# Patient Record
Sex: Female | Born: 1980 | Race: White | Hispanic: No | Marital: Married | Smoking: Never smoker
Health system: Southern US, Community
[De-identification: ages and names within clinical notes are randomized; demographics above are authoritative.]

## PROBLEM LIST (undated history)

## (undated) DIAGNOSIS — E538 Deficiency of other specified B group vitamins: Secondary | ICD-10-CM

## (undated) DIAGNOSIS — G2581 Restless legs syndrome: Secondary | ICD-10-CM

## (undated) DIAGNOSIS — R600 Localized edema: Secondary | ICD-10-CM

## (undated) DIAGNOSIS — E559 Vitamin D deficiency, unspecified: Secondary | ICD-10-CM

## (undated) DIAGNOSIS — S61217A Laceration without foreign body of left little finger without damage to nail, initial encounter: Secondary | ICD-10-CM

## (undated) DIAGNOSIS — F329 Major depressive disorder, single episode, unspecified: Secondary | ICD-10-CM

## (undated) DIAGNOSIS — K5090 Crohn's disease, unspecified, without complications: Secondary | ICD-10-CM

## (undated) DIAGNOSIS — F419 Anxiety disorder, unspecified: Secondary | ICD-10-CM

## (undated) DIAGNOSIS — F32A Depression, unspecified: Secondary | ICD-10-CM

## (undated) DIAGNOSIS — D649 Anemia, unspecified: Secondary | ICD-10-CM

## (undated) DIAGNOSIS — R0600 Dyspnea, unspecified: Secondary | ICD-10-CM

## (undated) DIAGNOSIS — J302 Other seasonal allergic rhinitis: Secondary | ICD-10-CM

## (undated) DIAGNOSIS — I8290 Acute embolism and thrombosis of unspecified vein: Secondary | ICD-10-CM

## (undated) DIAGNOSIS — R030 Elevated blood-pressure reading, without diagnosis of hypertension: Secondary | ICD-10-CM

## (undated) DIAGNOSIS — K589 Irritable bowel syndrome without diarrhea: Secondary | ICD-10-CM

## (undated) DIAGNOSIS — R160 Hepatomegaly, not elsewhere classified: Secondary | ICD-10-CM

## (undated) DIAGNOSIS — K219 Gastro-esophageal reflux disease without esophagitis: Secondary | ICD-10-CM

## (undated) DIAGNOSIS — R519 Headache, unspecified: Secondary | ICD-10-CM

## (undated) DIAGNOSIS — D259 Leiomyoma of uterus, unspecified: Secondary | ICD-10-CM

## (undated) HISTORY — DX: Leiomyoma of uterus, unspecified: D25.9

## (undated) HISTORY — DX: Deficiency of other specified B group vitamins: E53.8

## (undated) HISTORY — DX: Hepatomegaly, not elsewhere classified: R16.0

## (undated) HISTORY — DX: Vitamin D deficiency, unspecified: E55.9

## (undated) HISTORY — DX: Gastro-esophageal reflux disease without esophagitis: K21.9

## (undated) HISTORY — DX: Anemia, unspecified: D64.9

## (undated) HISTORY — DX: Irritable bowel syndrome, unspecified: K58.9

## (undated) HISTORY — DX: Depression, unspecified: F32.A

## (undated) HISTORY — DX: Laceration without foreign body of left little finger without damage to nail, initial encounter: S61.217A

## (undated) HISTORY — DX: Acute embolism and thrombosis of unspecified vein: I82.90

## (undated) HISTORY — DX: Restless legs syndrome: G25.81

## (undated) HISTORY — PX: BOWEL RESECTION: SHX1257

## (undated) HISTORY — DX: Other seasonal allergic rhinitis: J30.2

## (undated) HISTORY — DX: Elevated blood-pressure reading, without diagnosis of hypertension: R03.0

## (undated) HISTORY — DX: Dyspnea, unspecified: R06.00

## (undated) HISTORY — DX: Localized edema: R60.0

## (undated) HISTORY — DX: Headache, unspecified: R51.9

## (undated) HISTORY — PX: NASAL SINUS SURGERY: SHX719

## (undated) HISTORY — PX: BREAST BIOPSY: SHX20

---
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
## 2011-11-02 ENCOUNTER — Other Ambulatory Visit: Payer: Self-pay | Admitting: Obstetrics and Gynecology

## 2011-11-02 DIAGNOSIS — D219 Benign neoplasm of connective and other soft tissue, unspecified: Secondary | ICD-10-CM

## 2011-11-02 DIAGNOSIS — IMO0002 Reserved for concepts with insufficient information to code with codable children: Secondary | ICD-10-CM

## 2011-11-05 ENCOUNTER — Ambulatory Visit
Admission: RE | Admit: 2011-11-05 | Discharge: 2011-11-05 | Disposition: A | Discharge status: Home / Self Care | Payer: BC Managed Care – PPO | Source: Ambulatory Visit | Attending: Obstetrics and Gynecology | Admitting: Obstetrics and Gynecology

## 2011-11-05 DIAGNOSIS — IMO0002 Reserved for concepts with insufficient information to code with codable children: Secondary | ICD-10-CM

## 2011-11-05 DIAGNOSIS — D219 Benign neoplasm of connective and other soft tissue, unspecified: Secondary | ICD-10-CM

## 2013-07-10 IMAGING — US US TRANSVAGINAL NON-OB
1 series · 14 of 25 positions shown · non-contrast
Comparison: None.

CLINICAL DATA: Fibroids.  Dyspareunia.

TRANSVAGINAL ULTRASOUND OF PELVIS
TECHNIQUE: Transvaginal ultrasound examination of the pelvis was
performed including evaluation of the uterus, ovaries, adnexal
regions, and pelvic cul-de-sac.

[Series 1: us transvaginal non-ob · 0.13mm/px · 14 of 55 slices shown]
[im 1/55]
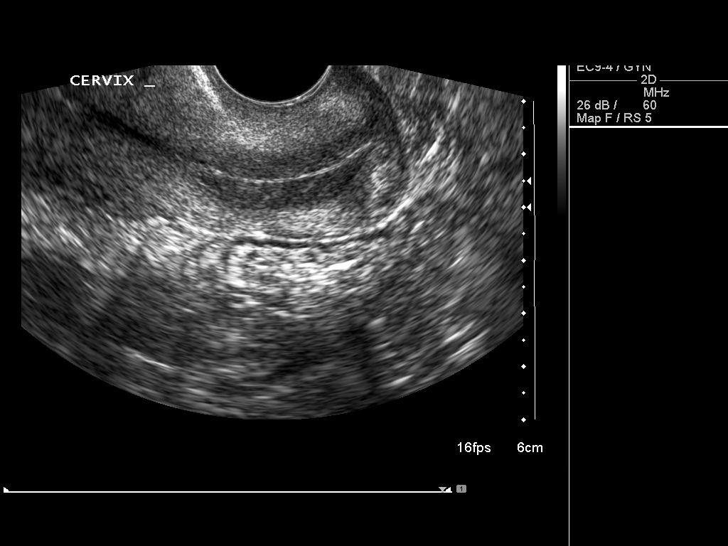
[im 5/55]
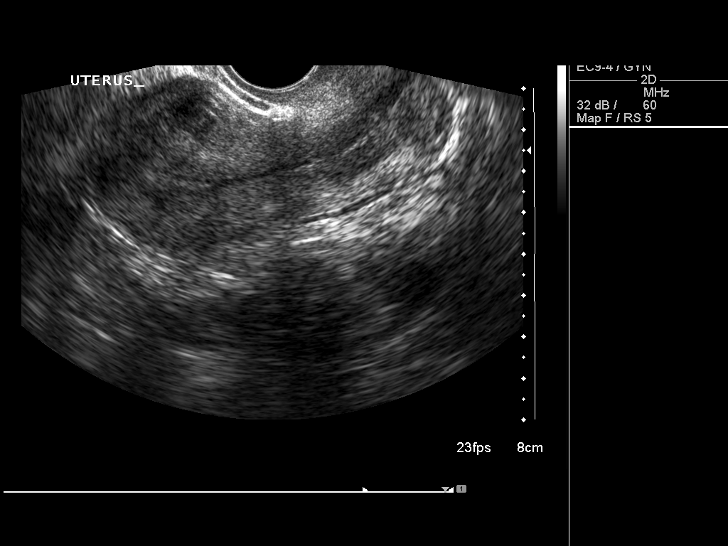
[im 10/55]
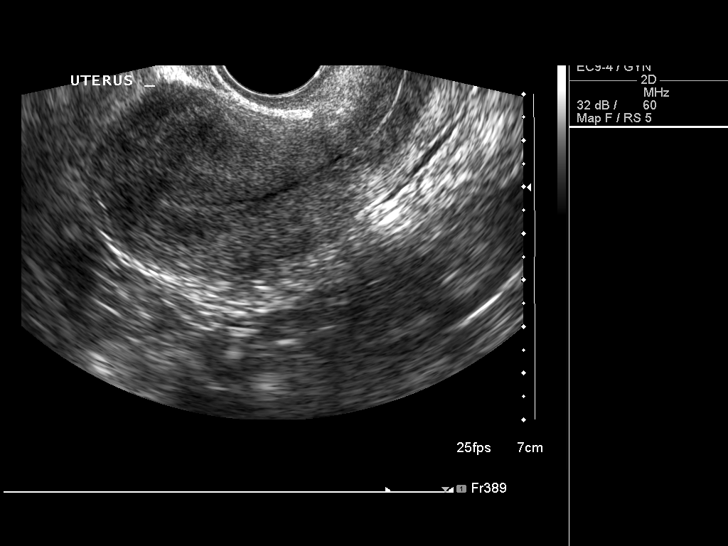
[im 14/55]
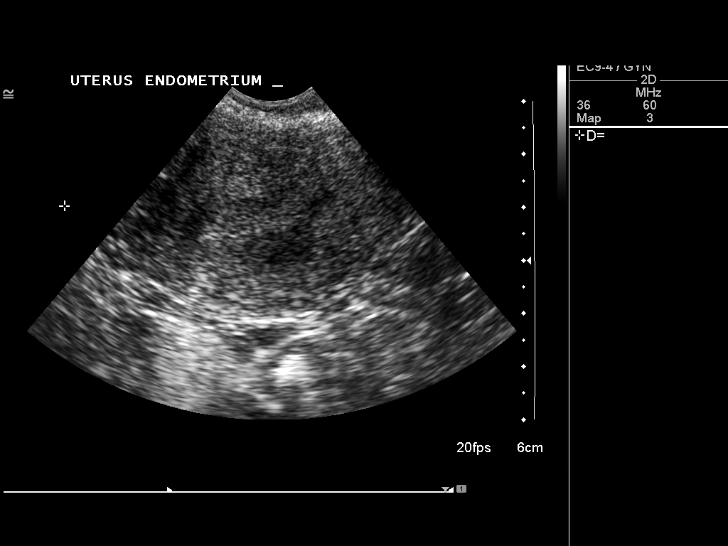
[im 19/55]
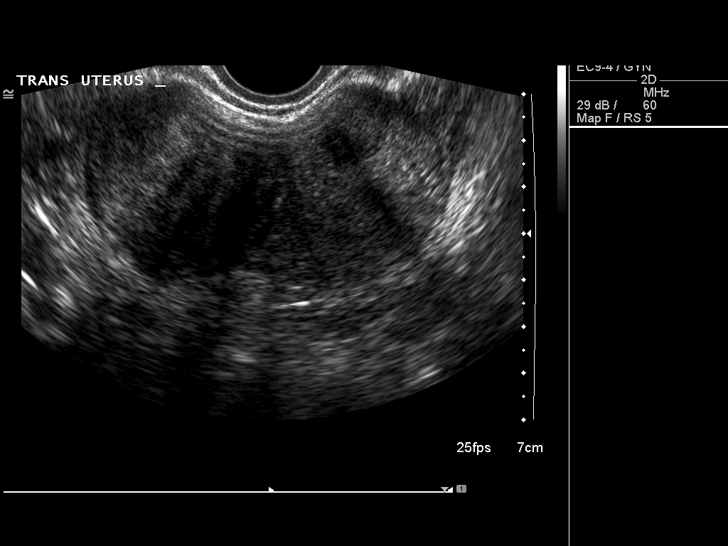
[im 21/55]
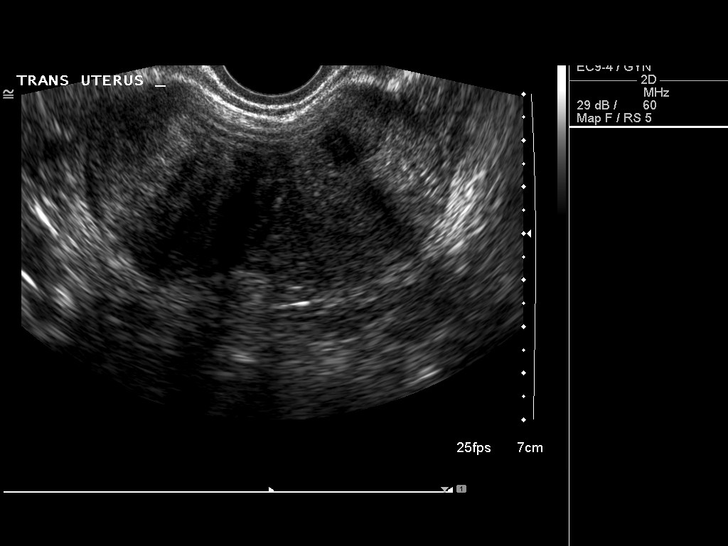
[im 25/55]
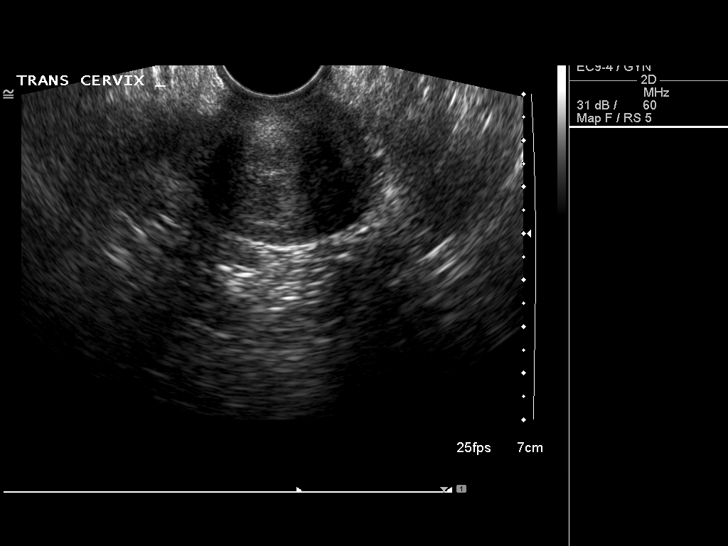
[im 30/55]
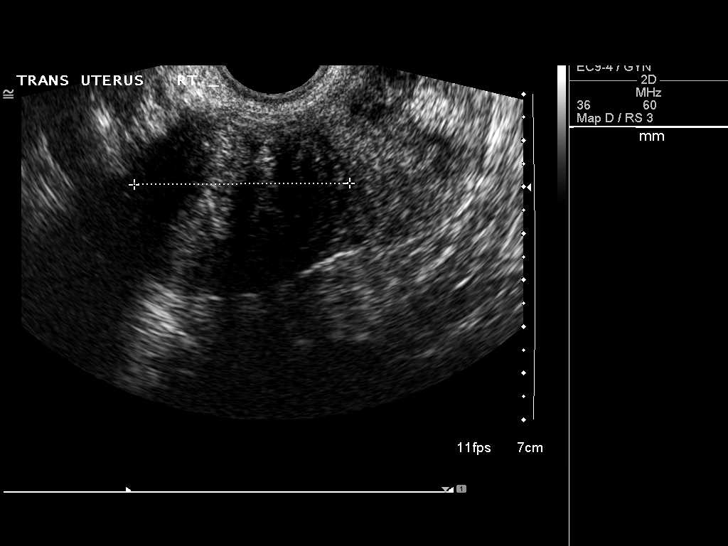
[im 34/55]
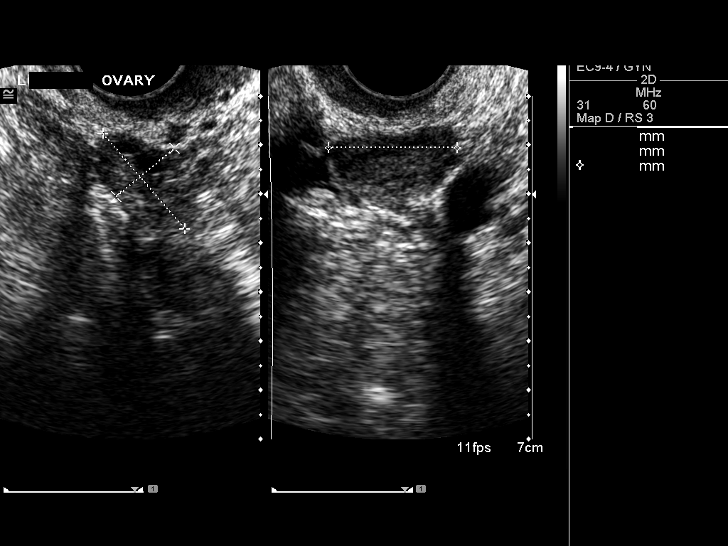
[im 37/55]
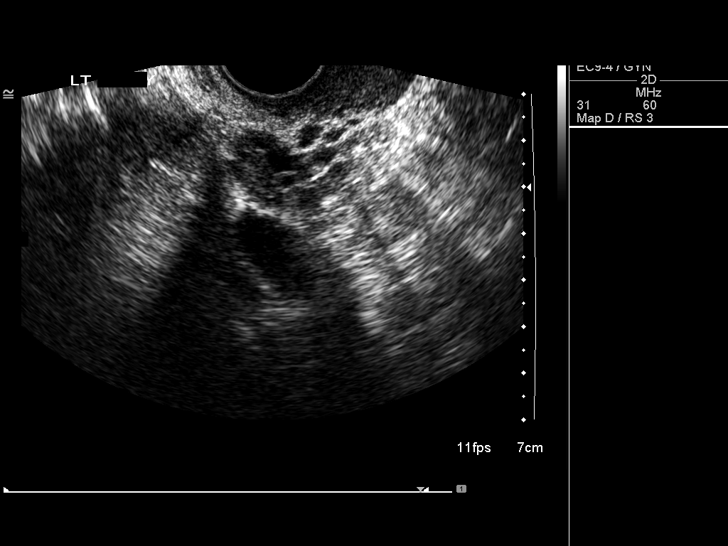
[im 41/55]
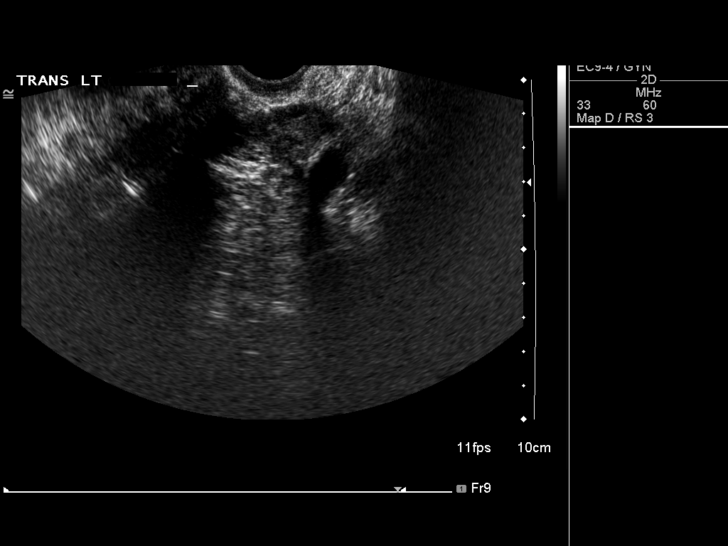
[im 46/55]
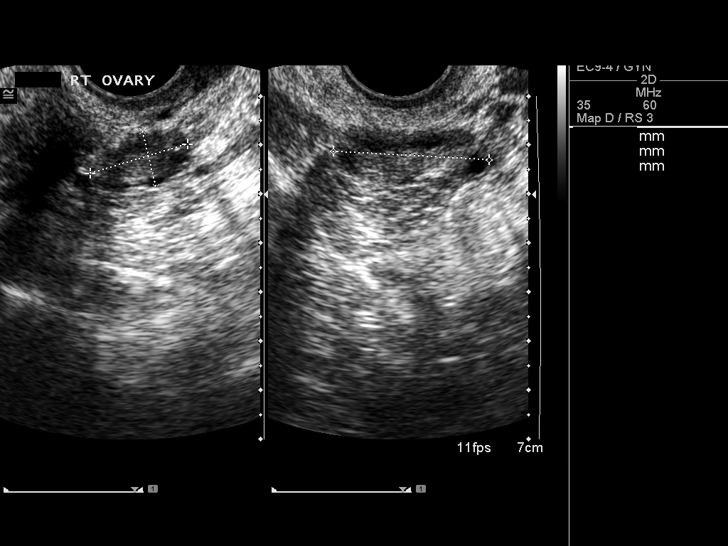
[im 50/55]
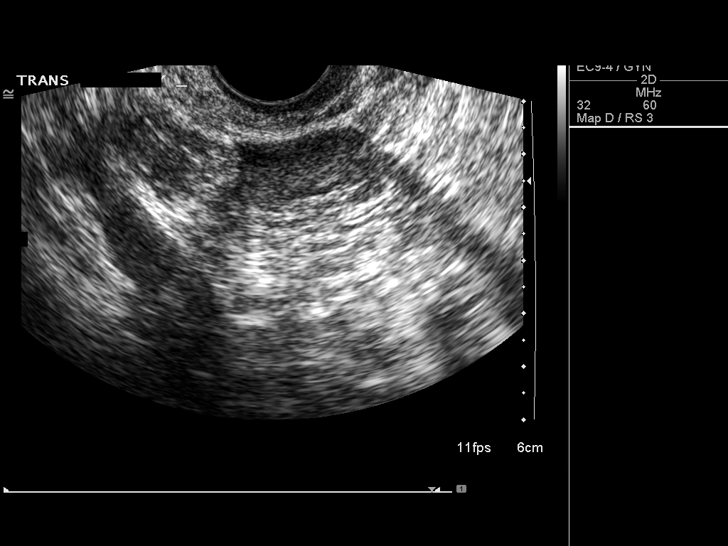
[im 55/55]
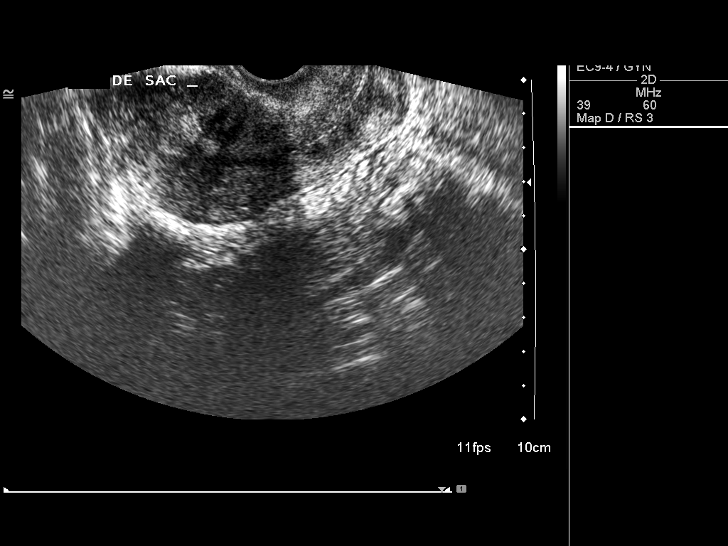

[14 of 25 positions shown; findings below may reference images not displayed]

FINDINGS: Uterus:  The uterus is anteverted and measures 9 x 4 x 8 cm.  There
is a pedunculated fibroid extending from the right aspect of the
uterine fundus and proximal body that measures 5.0 x 3.7 x 4.6 cm.
An intramural fibroid in the left uterine fundus measures 1.9 x
x 2.0 cm.

Endometrium: Normal in appearance.  Measures 8 mm maximum
thickness.

Right ovary: Normal appearance/no adnexal mass.  Measures 2.1 x
x 3.2 cm and contains several small follicles.

Left ovary: Normal appearance/no adnexal mass.  Measures 2.6 x
x 2.6 cm and contains several small follicles.

Other Findings:  No free fluid
IMPRESSION: 1.  Two discrete uterine fibroids are visualized.  The largest is a
pedunculated right uterine fibroid measuring 5.0 cm greatest
diameter.
2.  Normal ovaries.

## 2015-12-13 DIAGNOSIS — Z86718 Personal history of other venous thrombosis and embolism: Secondary | ICD-10-CM

## 2016-10-22 ENCOUNTER — Encounter (HOSPITAL_BASED_OUTPATIENT_CLINIC_OR_DEPARTMENT_OTHER): Payer: BC Managed Care – PPO | Attending: Internal Medicine

## 2016-10-22 DIAGNOSIS — Z9049 Acquired absence of other specified parts of digestive tract: Secondary | ICD-10-CM | POA: Diagnosis not present

## 2016-10-22 DIAGNOSIS — Z86718 Personal history of other venous thrombosis and embolism: Secondary | ICD-10-CM | POA: Diagnosis not present

## 2016-10-22 DIAGNOSIS — L89892 Pressure ulcer of other site, stage 2: Secondary | ICD-10-CM | POA: Diagnosis present

## 2016-10-22 DIAGNOSIS — K50818 Crohn's disease of both small and large intestine with other complication: Secondary | ICD-10-CM | POA: Diagnosis not present

## 2016-10-29 DIAGNOSIS — L89892 Pressure ulcer of other site, stage 2: Secondary | ICD-10-CM | POA: Diagnosis not present

## 2016-11-05 ENCOUNTER — Encounter (HOSPITAL_BASED_OUTPATIENT_CLINIC_OR_DEPARTMENT_OTHER): Payer: BC Managed Care – PPO

## 2018-02-24 ENCOUNTER — Encounter: Payer: Self-pay | Admitting: Emergency Medicine

## 2018-02-24 DIAGNOSIS — F411 Generalized anxiety disorder: Secondary | ICD-10-CM | POA: Insufficient documentation

## 2018-02-24 DIAGNOSIS — F329 Major depressive disorder, single episode, unspecified: Secondary | ICD-10-CM

## 2018-02-28 ENCOUNTER — Emergency Department (HOSPITAL_BASED_OUTPATIENT_CLINIC_OR_DEPARTMENT_OTHER)
Admission: EM | Admit: 2018-02-28 | Discharge: 2018-03-01 | Disposition: A | Discharge status: Home / Self Care | Payer: BC Managed Care – PPO | Attending: Emergency Medicine | Admitting: Emergency Medicine

## 2018-02-28 ENCOUNTER — Other Ambulatory Visit: Payer: Self-pay

## 2018-02-28 ENCOUNTER — Encounter (HOSPITAL_BASED_OUTPATIENT_CLINIC_OR_DEPARTMENT_OTHER): Payer: Self-pay | Admitting: Emergency Medicine

## 2018-02-28 DIAGNOSIS — Z79899 Other long term (current) drug therapy: Secondary | ICD-10-CM | POA: Diagnosis not present

## 2018-02-28 DIAGNOSIS — S61217A Laceration without foreign body of left little finger without damage to nail, initial encounter: Secondary | ICD-10-CM | POA: Diagnosis not present

## 2018-02-28 DIAGNOSIS — S6992XA Unspecified injury of left wrist, hand and finger(s), initial encounter: Secondary | ICD-10-CM | POA: Diagnosis present

## 2018-02-28 DIAGNOSIS — Y998 Other external cause status: Secondary | ICD-10-CM | POA: Insufficient documentation

## 2018-02-28 DIAGNOSIS — Y9389 Activity, other specified: Secondary | ICD-10-CM | POA: Insufficient documentation

## 2018-02-28 DIAGNOSIS — W01118A Fall on same level from slipping, tripping and stumbling with subsequent striking against other sharp object, initial encounter: Secondary | ICD-10-CM | POA: Diagnosis not present

## 2018-02-28 DIAGNOSIS — Y929 Unspecified place or not applicable: Secondary | ICD-10-CM | POA: Diagnosis not present

## 2018-02-28 HISTORY — DX: Anxiety disorder, unspecified: F41.9

## 2018-02-28 HISTORY — DX: Crohn's disease, unspecified, without complications: K50.90

## 2018-02-28 HISTORY — DX: Laceration without foreign body of left little finger without damage to nail, initial encounter: S61.217A

## 2018-02-28 NOTE — ED Notes (Signed)
ED Provider at bedside. 

## 2018-02-28 NOTE — ED Triage Notes (Signed)
Pt states she cut a chunk out of her left pinky finger   States she tripped on the cat and came down on a piece of glass she was using for crafting

## 2018-03-01 NOTE — ED Provider Notes (Signed)
[facility] DEPART[facility] Provider Note   CSN: [id number] Arrival date & time: 02/28/18  2327     History   Chief Complaint Chief Complaint  Patient presents with  . Extremity Laceration    HPI Traci [name] is a 37 y.o. female.  The history is provided by the patient and the spouse.  Laceration   The incident occurred less than 1 hour ago. The laceration is located on the left hand. The pain is mild. The pain has been constant since onset. She reports no foreign bodies present. Her tetanus status is UTD.  Patient reports falling and cutting her left little finger on a sharp edge of glass.  Glass did not shatter.  There are no foreign bodies in the wound.  No other acute injuries reported  Past Medical History:  Diagnosis Date  . Anxiety   . Crohn's disease [facility])     Patient Active Problem List   Diagnosis Date Noted  . GAD (generalized anxiety disorder) 02/24/2018  . MDD (major depressive disorder) 02/24/2018    Past Surgical History:  Procedure Laterality Date  . BOWEL RESECTION    . BREAST BIOPSY    . NASAL SINUS SURGERY       OB History   None      Home Medications    Prior to Admission medications   Medication Sig Start Date End Date Taking? Authorizing Provider  azaTHIOprine (IMURAN) 50 MG tablet Take 125 mg by mouth daily.   Yes [provider]  clonazePAM (KLONOPIN) 0.5 MG tablet Take 0.5 mg by mouth 2 (two) times daily as needed for anxiety. 1/2 - 1 po bid prn 01/30/18  Yes [provider]  DULoxetine (CYMBALTA) 30 MG capsule Take 30 mg by mouth daily. 1 po qam x 1wk, 1 qam x 1 wk, 3 po qam 01/30/18  Yes [provider]  folic acid (FOLVITE) 1 MG tablet Take 1 mg by mouth daily.   Yes [provider]  inFLIXimab (REMICADE IV) Inject into the vein.   Yes [provider]  loratadine (CLARITIN) 10 MG tablet Take 10 mg by mouth daily.   Yes [provider]  montelukast (SINGULAIR)  10 MG tablet Take 10 mg by mouth at bedtime.   Yes [provider]  omeprazole (PRILOSEC) 20 MG capsule Take 20 mg by mouth daily.   Yes [provider]  rOPINIRole (REQUIP) 4 MG tablet Take 8 mg by mouth at bedtime.   Yes [provider]  sertraline (ZOLOFT) 100 MG tablet Take 100 mg by mouth daily. 08/15/17   [provider]    Family History Family History  Problem Relation Age of Onset  . Heart failure Other   . Leukemia Paternal Grandmother     Social History Social History   Tobacco Use  . Smoking status: Never Smoker  . Smokeless tobacco: Never Used  Substance Use Topics  . Alcohol use: Never    Frequency: Never  . Drug use: Never     Allergies   Wellbutrin [bupropion]   Review of Systems Review of Systems  Constitutional: Negative for fever.  Skin: Positive for wound.     Physical Exam Updated Vital Signs BP (!) 153/111 (BP Location: Left Arm)   Pulse (!) 102   Temp 98.6 F (37 C) (Oral)   Resp 16   Ht 1.702 m (5' 7" )   Wt 127 kg   LMP 02/26/2018 (Exact Date)   SpO2 100%  BMI 43.85 kg/m   Physical Exam CONSTITUTIONAL: Well developed/well nourished HEAD: Normocephalic/atraumatic EYES: EOMI ENMT: Mucous membranes moist NECK: supple no meningeal signs CV: S1/S2 noted, no murmurs/rubs/gallops noted LUNGS: Lungs are clear to auscultation bilaterally, no apparent distress ABDOMEN: soft EXTREMITIES: pulses normal/equal, full ROM, full range of motion of left little finger in flexion extension without difficulty.  No evidence of any tendon injury SKIN: warm, color normal Lower/volar aspect of left little finger just beyond the MCP.  Small triangular wound is noted.  Mild bleeding noted.  No bone or tendon involved.  No foreign bodies noted. PSYCH: no abnormalities of mood noted, alert and oriented to situation   ED Treatments / Results  Labs (all labs ordered are listed, but only abnormal results are  displayed) Labs Reviewed - No data to display  EKG None  Radiology No results found.  Procedures Procedures (including critical care time)  Medications Ordered in ED Medications - No data to display   Initial Impression / Assessment and Plan / ED Course  I have reviewed the triage vital signs and the nursing notes.  Wound is somewhat superficial and not amenable to repair.  There is no evidence of any bony or tendon injury.  No foreign bodies noted.  I personally rinsed the wound out and no deep structures or glass were noted  Discussed appropriate wound care, patient is comfortable with plan.  Final Clinical Impressions(s) / ED Diagnoses   Final diagnoses:  Laceration of left little finger without foreign body without damage to nail, initial encounter    ED Discharge Orders    None       Ripley Fraise, MD 03/01/18 [phone]

## 2018-03-04 ENCOUNTER — Ambulatory Visit: Payer: BC Managed Care – PPO | Admitting: Physician Assistant

## 2018-03-04 ENCOUNTER — Encounter: Payer: Self-pay | Admitting: Physician Assistant

## 2018-03-04 DIAGNOSIS — G2581 Restless legs syndrome: Secondary | ICD-10-CM

## 2018-03-04 DIAGNOSIS — F331 Major depressive disorder, recurrent, moderate: Secondary | ICD-10-CM | POA: Diagnosis not present

## 2018-03-04 DIAGNOSIS — F411 Generalized anxiety disorder: Secondary | ICD-10-CM

## 2018-03-04 MED ORDER — DULOXETINE HCL 30 MG PO CPEP: ORAL_CAPSULE | ORAL | 1 refills | Status: DC

## 2018-03-04 NOTE — Progress Notes (Signed)
[facility]  Patient ID: Traci [name],  MRN: [id number]  PCP: Algis Greenhouse, MD  Date of Evaluation: 03/04/2018 Time spent:15 minutes  Chief Complaint:  Chief Complaint    Follow-up; Anxiety      HISTORY/CURRENT STATUS: HPI For 4 week med check. Accompanied by husband [name].   We changed Zoloft to Cymbalta. It was a hard transition with increased irritability.  After couple weeks, that went away and she states she is feeling quite a bit better with anxiety and depression.  The only been on this dose of Cymbalta for about 2 weeks now.  Work is going well.  She is sleeping pretty good.  She does not cry as easily as she did.  She still sensitive, but states that is just to she is.  Energy and motivation are better.  She is not isolating.  She accidentally cut her left little finger a couple of days ago.  She was doing crafts on glass was broken pane that cut her.  She did go to the doctor and did not require stitches.  She is using some sort of silver-containing patch over it and it is healing well.  Past medications for mental health diagnoses include: Zoloft, Xanax, Wellbutrin, Lexapro, Effexor, Luvox, Requip, Klonopin, Cymbalta  Individual Medical History/ Review of Systems: Changes? :Yes   Allergies: Wellbutrin [bupropion]  Current Medications:  Current Outpatient Medications:  .  azaTHIOprine (IMURAN) 50 MG tablet, Take 125 mg by mouth daily., Disp: , Rfl:  .  clonazePAM (KLONOPIN) 0.5 MG tablet, Take 0.5 mg by mouth 2 (two) times daily as needed for anxiety. 1/2 - 1 po bid prn, Disp: , Rfl:  .  DULoxetine (CYMBALTA) 30 MG capsule, 3 po qam, Disp: 90 capsule, Rfl: 1 .  folic acid (FOLVITE) 1 MG tablet, Take 1 mg by mouth daily., Disp: , Rfl:  .  inFLIXimab (REMICADE IV), Inject into the vein., Disp: , Rfl:  .  loratadine (CLARITIN) 10 MG tablet, Take 10 mg by mouth daily., Disp: , Rfl:  .  montelukast (SINGULAIR) 10 MG tablet, Take 10 mg by mouth at  bedtime., Disp: , Rfl:  .  omeprazole (PRILOSEC) 20 MG capsule, Take 20 mg by mouth daily., Disp: , Rfl:  .  rOPINIRole (REQUIP) 4 MG tablet, Take 8 mg by mouth at bedtime., Disp: , Rfl:  Medication Side Effects: none  Family Medical/ Social History: Changes? No  MENTAL HEALTH EXAM:  Last menstrual period 02/26/2018.There is no height or weight on file to calculate BMI.  General Appearance: Well Groomed  Eye Contact:  Good  Speech:  Clear and Coherent  Volume:  Normal  Mood:  Euthymic  Affect:  Appropriate  Thought Process:  Goal Directed  Orientation:  Full (Time, Place, and Person)  Thought Content: Logical   Suicidal Thoughts:  No  Homicidal Thoughts:  No  Memory:  WNL  Judgement:  Good  Insight:  Good  Psychomotor Activity:  Normal  Concentration:  Concentration: Good  Recall:  Good  Fund of Knowledge: Good  Language: Good  Assets:  Desire for Improvement  ADL's:  Intact  Cognition: WNL  Prognosis:  Good    DIAGNOSES:    ICD-10-CM   1. Generalized anxiety disorder F41.1   2. Major depressive disorder, recurrent episode, moderate (HCC) F33.1   3. Restless leg syndrome G25.81     Receiving Psychotherapy: No    RECOMMENDATIONS: We agreed to continue the Cymbalta 90 mg for now.  If in 3  to 4 weeks she feels like it is not working as well as it could) at least 50 to 75% better than she was) give [place] a call and I will increase it to 120 mg. Continue all other medications. Return in 6 weeks.  [name], PA-C

## 2018-04-15 ENCOUNTER — Encounter: Payer: Self-pay | Admitting: Physician Assistant

## 2018-04-15 ENCOUNTER — Ambulatory Visit: Payer: BC Managed Care – PPO | Admitting: Physician Assistant

## 2018-04-15 VITALS — BP 146/96 | HR 112

## 2018-04-15 DIAGNOSIS — F331 Major depressive disorder, recurrent, moderate: Secondary | ICD-10-CM | POA: Diagnosis not present

## 2018-04-15 DIAGNOSIS — F411 Generalized anxiety disorder: Secondary | ICD-10-CM

## 2018-04-15 MED ORDER — DULOXETINE HCL 60 MG PO CPEP: 120.0000 mg | ORAL_CAPSULE | Freq: Every day | ORAL | 2 refills | Status: DC

## 2018-04-15 NOTE — Progress Notes (Signed)
[facility]  Patient ID: Traci [name],  MRN: [id number]  PCP: Algis Greenhouse, MD  Date of Evaluation: 04/15/2018 Time spent:15 minutes  Chief Complaint:  Chief Complaint    Follow-up      HISTORY/CURRENT STATUS: HPI Here for 6 week med check. Accompanied by husband [name].  Has been more irritable.  Is very quick to be overwhelmed. But is less lethargic. Wants to make everybody happy with their Christmas presents.  She has several friends who are separating and that's been hard.  She is very empathetic and takes to heart everything that people share with her or she is concerned about with someone else.  She still quite anxious at times.  Patient denies loss of interest in usual activities and is able to enjoy things.  Denies decreased energy or motivation.  Appetite has not changed.  She does get sad easily but it passes quickly.  She cries easily but that is not any different.  She is always been that way.  Denies any changes in concentration, making decisions or remembering things.  Denies suicidal or homicidal thoughts.  Individual Medical History/ Review of Systems: Changes? :No    Past medications for mental health diagnoses include: Zoloft, Xanax, Wellbutrin, Lexapro, Effexor, Luvox  Allergies: Wellbutrin [bupropion]  Current Medications:  Current Outpatient Medications:  .  azaTHIOprine (IMURAN) 50 MG tablet, Take 125 mg by mouth daily., Disp: , Rfl:  .  clonazePAM (KLONOPIN) 0.5 MG tablet, Take 0.5 mg by mouth 2 (two) times daily as needed for anxiety. 1/2 - 1 po bid prn, Disp: , Rfl:  .  folic acid (FOLVITE) 1 MG tablet, Take 1 mg by mouth daily., Disp: , Rfl:  .  inFLIXimab (REMICADE IV), Inject into the vein., Disp: , Rfl:  .  loratadine (CLARITIN) 10 MG tablet, Take 10 mg by mouth daily., Disp: , Rfl:  .  Melatonin 10 MG TABS, Take 10 mg by mouth., Disp: , Rfl:  .  montelukast (SINGULAIR) 10 MG tablet, Take 10 mg by mouth at bedtime., Disp: , Rfl:  .   omeprazole (PRILOSEC) 20 MG capsule, Take 20 mg by mouth daily., Disp: , Rfl:  .  rOPINIRole (REQUIP) 4 MG tablet, Take 8 mg by mouth at bedtime., Disp: , Rfl:  .  DULoxetine (CYMBALTA) 60 MG capsule, Take 2 capsules (120 mg total) by mouth daily., Disp: 60 capsule, Rfl: 2 Medication Side Effects: none  Family Medical/ Social History: Changes? No  MENTAL HEALTH EXAM:  Blood pressure (!) 146/96, pulse (!) 112.There is no height or weight on file to calculate BMI.  General Appearance: Casual and Well Groomed  Eye Contact:  Good  Speech:  Clear and Coherent  Volume:  Normal  Mood:  Euthymic  Affect:  Appropriate  Thought Process:  Goal Directed  Orientation:  Full (Time, Place, and Person)  Thought Content: Logical   Suicidal Thoughts:  No  Homicidal Thoughts:  No  Memory:  WNL  Judgement:  Good  Insight:  Good  Psychomotor Activity:  Normal  Concentration:  Concentration: Good  Recall:  Good  Fund of Knowledge: Good  Language: Good  Assets:  Desire for Improvement  ADL's:  Intact  Cognition: WNL  Prognosis:  Good    DIAGNOSES:    ICD-10-CM   1. Major depressive disorder, recurrent episode, moderate (HCC) F33.1   2. Generalized anxiety disorder F41.1     Receiving Psychotherapy: No    RECOMMENDATIONS: Increase Cymbalta to a total of 120 mg.  Continue Requip 4 mg 2 nightly for restless leg. Continue Klonopin 0.5 mg 1 twice daily as needed anxiety. Return in 4 to 6 weeks.  [name], PA-C

## 2018-05-08 ENCOUNTER — Other Ambulatory Visit: Payer: Self-pay | Admitting: Physician Assistant

## 2018-05-13 ENCOUNTER — Telehealth: Payer: Self-pay | Admitting: Physician Assistant

## 2018-05-13 MED ORDER — DULOXETINE HCL 30 MG PO CPEP: 30.0000 mg | ORAL_CAPSULE | Freq: Every day | ORAL | 1 refills | Status: DC

## 2018-05-13 NOTE — Telephone Encounter (Signed)
Please advise 

## 2018-05-13 NOTE — Telephone Encounter (Signed)
Pt increased her Cymbalta to 1107m. Since then has experienced heart racing and some increase in panic symptoms. Can she decrease back to 954m

## 2018-05-13 NOTE — Telephone Encounter (Signed)
Needs 30 mg cymbalta to take with her 60 mg. Instructed to take 90 mg/day rx sent to her pharmacy

## 2018-05-13 NOTE — Telephone Encounter (Signed)
Yes, go back to 49m.  If she needs Rx, okay to give 90 day supply w/ 1 RF

## 2018-05-30 ENCOUNTER — Ambulatory Visit: Payer: BC Managed Care – PPO | Admitting: Physician Assistant

## 2018-05-30 ENCOUNTER — Encounter: Payer: Self-pay | Admitting: Physician Assistant

## 2018-05-30 DIAGNOSIS — K50919 Crohn's disease, unspecified, with unspecified complications: Secondary | ICD-10-CM | POA: Diagnosis not present

## 2018-05-30 DIAGNOSIS — G2581 Restless legs syndrome: Secondary | ICD-10-CM

## 2018-05-30 DIAGNOSIS — F411 Generalized anxiety disorder: Secondary | ICD-10-CM

## 2018-05-30 DIAGNOSIS — F331 Major depressive disorder, recurrent, moderate: Secondary | ICD-10-CM | POA: Diagnosis not present

## 2018-05-30 MED ORDER — DULOXETINE HCL 60 MG PO CPEP: 60.0000 mg | ORAL_CAPSULE | Freq: Every day | ORAL | 3 refills | Status: DC

## 2018-05-30 NOTE — Progress Notes (Signed)
[facility]  Patient ID: Traci [name],  MRN: [id number]  PCP: Algis Greenhouse, MD  Date of Evaluation: 05/30/2018 Time spent:15 minutes  Chief Complaint:  Chief Complaint    Follow-up      HISTORY/CURRENT STATUS: HPI here for follow-up after increasing Cymbalta dose.  Accompanied by her husband, [name]  She was not able to tolerate the Cymbalta 120 mg.  It caused more anxiety and more frequent panic attacks.  She called and we decreased the dose back to 90 mg, which she has been on for approximately 2 weeks.  States that panic attacks have lessened and she is not as anxious in general.  However she still does not feel well and ask if it would be appropriate to go back to 60 mg.  She continues to have easy crying spells but patient and her husband states that has been her norm for years.  She does not really feel depressed.  Energy and motivation are good.  Denies suicidal or homicidal thoughts.  She sleeps well and not too much.  She is able to enjoy things.  She continues to work and does not miss days.  Symptoms of restless leg syndrome are controlled most of the time.  There are evenings where that even with a high dose of Requip, she will still have uneasiness and movements in her legs.  Individual Medical History/ Review of Systems: Changes? :No  She continues to see GI for Crohn's disease.  She had her last dose of Remicade yesterday.  Symptoms are controlled.  Past medications for mental health diagnoses include: Zoloft, Xanax, Wellbutrin, Lexapro, Effexor, Luvox  Allergies: Wellbutrin [bupropion]  Current Medications:  Current Outpatient Medications:  .  azaTHIOprine (IMURAN) 50 MG tablet, Take 125 mg by mouth daily., Disp: , Rfl:  .  clonazePAM (KLONOPIN) 0.5 MG tablet, Take 0.5 mg by mouth 2 (two) times daily as needed for anxiety. 1/2 - 1 po bid prn, Disp: , Rfl:  .  folic acid (FOLVITE) 1 MG tablet, Take 1 mg by mouth daily., Disp: , Rfl:  .  inFLIXimab  (REMICADE IV), Inject into the vein., Disp: , Rfl:  .  loratadine (CLARITIN) 10 MG tablet, Take 10 mg by mouth daily., Disp: , Rfl:  .  Melatonin 10 MG TABS, Take 10 mg by mouth., Disp: , Rfl:  .  montelukast (SINGULAIR) 10 MG tablet, Take 10 mg by mouth at bedtime., Disp: , Rfl:  .  omeprazole (PRILOSEC) 20 MG capsule, Take 20 mg by mouth daily., Disp: , Rfl:  .  rOPINIRole (REQUIP) 4 MG tablet, TAKE 1 TO 2 TABLETS 1 TO 3 HOURS BEFORE BEDTIME, Disp: 180 tablet, Rfl: 0 Medication Side Effects: none  Family Medical/ Social History: Changes? No  MENTAL HEALTH EXAM:  There were no vitals taken for this visit.There is no height or weight on file to calculate BMI.  General Appearance: Casual, Well Groomed and Obese  Eye Contact:  Good  Speech:  Clear and Coherent  Volume:  Normal  Mood:  Euthymic  Affect:  Appropriate  Thought Process:  Goal Directed  Orientation:  Full (Time, Place, and Person)  Thought Content: Logical   Suicidal Thoughts:  No  Homicidal Thoughts:  No  Memory:  WNL  Judgement:  Good  Insight:  Good  Psychomotor Activity:  Normal  Concentration:  Concentration: Good  Recall:  Good  Fund of Knowledge: Good  Language: Good  Assets:  Desire for Improvement  ADL's:  Intact  Cognition:  WNL  Prognosis:  Good    DIAGNOSES:    ICD-10-CM   1. Generalized anxiety disorder F41.1   2. Major depressive disorder, recurrent episode, moderate (HCC) F33.1   3. Restless leg syndrome G25.81     Receiving Psychotherapy: No    RECOMMENDATIONS: decrease Cymbalta to 42m q am.  Continue Klonopin 0.5 mg twice daily as needed. Continue Requip 4 mg 1-2 3 hours before bedtime. Continue melatonin as needed for sleep. Return in 4 weeks or sooner as needed.   TDonnal Moat PA-C

## 2018-05-31 DIAGNOSIS — K5090 Crohn's disease, unspecified, without complications: Secondary | ICD-10-CM | POA: Insufficient documentation

## 2018-06-30 ENCOUNTER — Ambulatory Visit: Payer: BC Managed Care – PPO | Admitting: Physician Assistant

## 2018-06-30 ENCOUNTER — Encounter (INDEPENDENT_AMBULATORY_CARE_PROVIDER_SITE_OTHER): Payer: Self-pay

## 2018-06-30 ENCOUNTER — Encounter: Payer: Self-pay | Admitting: Physician Assistant

## 2018-06-30 DIAGNOSIS — F411 Generalized anxiety disorder: Secondary | ICD-10-CM | POA: Diagnosis not present

## 2018-06-30 DIAGNOSIS — F331 Major depressive disorder, recurrent, moderate: Secondary | ICD-10-CM | POA: Diagnosis not present

## 2018-06-30 DIAGNOSIS — G2581 Restless legs syndrome: Secondary | ICD-10-CM | POA: Diagnosis not present

## 2018-06-30 NOTE — Progress Notes (Signed)
[facility]  Patient ID: Traci [name],  MRN: [id number]  PCP: Algis Greenhouse, MD  Date of Evaluation: 06/30/2018 Time spent:15 minutes  Chief Complaint:  Chief Complaint    Follow-up; Anxiety; Depression      HISTORY/CURRENT STATUS: HPI Here for 1 month med check.  Accompanied by [name], husband.   States she's doing better.  Decreasing the Cymbalta has helped with anxiety.  She does cry easily still but pt and husband say that's not worse and she's always been tender-hearted.  Patient denies loss of interest in usual activities and is able to enjoy things.  Denies decreased energy or motivation.  Appetite has not changed.  No extreme sadness, tearfulness, or feelings of hopelessness.  Denies any changes in concentration, making decisions or remembering things.  Denies suicidal or homicidal thoughts.  Does have some irritability but not as bad. "I think I'm a lot better overall. I don't want to change anything."  Restless legs are mostly controlled.  They have been a little bit worse over the past month, but her sleep schedule has been off quite a bit.  Her sleep schedule is a bit more on track and she is not having as much restlessness.  Denies muscle or joint pain, stiffness, or dystonia.  Denies dizziness, syncope, seizures, numbness, tingling, tremor, tics, unsteady gait, slurred speech, confusion.   Individual Medical History/ Review of Systems: Changes? :Yes She and husband both had the flu in the past month.  Past medications for mental health diagnoses include: Zoloft, Xanax, Wellbutrin, Lexapro, Effexor, Luvox  Allergies: Wellbutrin [bupropion]  Current Medications:  Current Outpatient Medications:  .  azaTHIOprine (IMURAN) 50 MG tablet, Take 125 mg by mouth daily., Disp: , Rfl:  .  clonazePAM (KLONOPIN) 0.5 MG tablet, Take 0.5 mg by mouth 2 (two) times daily as needed for anxiety. 1/2 - 1 po bid prn, Disp: , Rfl:  .  DULoxetine (CYMBALTA) 60 MG capsule,  Take 1 capsule (60 mg total) by mouth daily., Disp: 30 capsule, Rfl: 3 .  folic acid (FOLVITE) 1 MG tablet, Take 1 mg by mouth daily., Disp: , Rfl:  .  inFLIXimab (REMICADE IV), Inject into the vein., Disp: , Rfl:  .  loratadine (CLARITIN) 10 MG tablet, Take 10 mg by mouth daily., Disp: , Rfl:  .  Melatonin 10 MG TABS, Take 10 mg by mouth., Disp: , Rfl:  .  montelukast (SINGULAIR) 10 MG tablet, Take 10 mg by mouth at bedtime., Disp: , Rfl:  .  omeprazole (PRILOSEC) 20 MG capsule, Take 20 mg by mouth daily., Disp: , Rfl:  .  rOPINIRole (REQUIP) 4 MG tablet, TAKE 1 TO 2 TABLETS 1 TO 3 HOURS BEFORE BEDTIME (Patient taking differently: Take 8 mg by mouth. ), Disp: 180 tablet, Rfl: 0 Medication Side Effects: none  Family Medical/ Social History: Changes? No  MENTAL HEALTH EXAM:  There were no vitals taken for this visit.There is no height or weight on file to calculate BMI.  General Appearance: Casual, Well Groomed and Obese  Eye Contact:  Good  Speech:  Clear and Coherent  Volume:  Normal  Mood:  Euthymic  Affect:  Appropriate  Thought Process:  Goal Directed  Orientation:  Full (Time, Place, and Person)  Thought Content: Logical   Suicidal Thoughts:  No  Homicidal Thoughts:  No  Memory:  WNL  Judgement:  Good  Insight:  Good  Psychomotor Activity:  Normal  Concentration:  Concentration: Good and Attention Span: Good  Recall:  Good  Fund of Knowledge: Good  Language: Good  Assets:  Desire for Improvement  ADL's:  Intact  Cognition: WNL  Prognosis:  Good    DIAGNOSES:    ICD-10-CM   1. Generalized anxiety disorder F41.1   2. Major depressive disorder, recurrent episode, moderate (HCC) F33.1   3. Restless leg syndrome G25.81     Receiving Psychotherapy: No    RECOMMENDATIONS: Continue Cymbalta 60 mg qd. Continue Klonopin 0.10m bid prn. Continue Requip 4 mg, 2 nightly. I am glad to see her doing better.  I am a little concerned about the easy crying.  I do feel though  that this is her personality and since no other symptoms of severe depression are present, I do not think we need to change anything at this point. Return in 3 months or sooner as needed.  TDonnal Moat PA-C   This record has been created using [employer]  Chart creation errors have been sought, but may not always have been located and corrected. Such creation errors do not reflect on the standard of medical care.

## 2018-07-18 ENCOUNTER — Other Ambulatory Visit: Payer: Self-pay

## 2018-07-18 ENCOUNTER — Encounter: Payer: Self-pay | Admitting: Physician Assistant

## 2018-07-18 ENCOUNTER — Ambulatory Visit: Payer: BC Managed Care – PPO | Admitting: Physician Assistant

## 2018-07-18 DIAGNOSIS — F411 Generalized anxiety disorder: Secondary | ICD-10-CM

## 2018-07-18 DIAGNOSIS — F331 Major depressive disorder, recurrent, moderate: Secondary | ICD-10-CM

## 2018-07-18 DIAGNOSIS — K50919 Crohn's disease, unspecified, with unspecified complications: Secondary | ICD-10-CM

## 2018-07-18 DIAGNOSIS — G2581 Restless legs syndrome: Secondary | ICD-10-CM | POA: Diagnosis not present

## 2018-07-18 MED ORDER — SERTRALINE HCL 100 MG PO TABS: ORAL_TABLET | ORAL | 0 refills | Status: DC

## 2018-07-18 NOTE — Patient Instructions (Signed)
On the Cymbalta 30 mg take 1 by mouth daily for 10 days then open up the capsule and pour about half of the grade he is on the inside into a teaspoon of pudding or applesauce or what ever for about 10 days and then stop that drug.   Go ahead and start the Zoloft as directed on the bottle while you are weaning down on the Cymbalta

## 2018-07-18 NOTE — Progress Notes (Signed)
[facility]  Patient ID: Traci [name],  MRN: [id number]  PCP: Algis Greenhouse, MD  Date of Evaluation: 07/18/2018 Time spent:15 minutes  Chief Complaint:  Chief Complaint    Anxiety      HISTORY/CURRENT STATUS: HPI  Seen in between visits for severe anxiety.  Accompanied by husband [name].  2 days ago, had a 3 hour PA.  "Not with palpitations and stuff but felt overwhelmed."  Also had pretty bad PMS this time.  "I don't think this med is working."  States she feels itchy like her little nerves are on edge at times.  Things are worse in the evenings.    She is not crying as easily as she did.  Motivation and energy are pretty good for her.  She has not missed any work.  She is not enjoying things as much like working on her crafts however.  She is not isolating.  Sleeps well.  Denies muscle or joint pain, stiffness, or dystonia.  Denies dizziness, syncope, seizures, numbness, tingling, tremor, tics, unsteady gait, slurred speech, confusion.   Individual Medical History/ Review of Systems: Changes? :No    Past medications for mental health diagnoses include: Zoloft, Xanax, Wellbutrin, Lexapro, Effexor, Luvox, Cymbalta  Allergies: Wellbutrin [bupropion]  Current Medications:  Current Outpatient Medications:  .  azaTHIOprine (IMURAN) 50 MG tablet, Take 125 mg by mouth daily., Disp: , Rfl:  .  clonazePAM (KLONOPIN) 0.5 MG tablet, Take 0.5 mg by mouth 2 (two) times daily as needed for anxiety. 1/2 - 1 po bid prn, Disp: , Rfl:  .  folic acid (FOLVITE) 1 MG tablet, Take 1 mg by mouth daily., Disp: , Rfl:  .  inFLIXimab (REMICADE IV), Inject into the vein., Disp: , Rfl:  .  loratadine (CLARITIN) 10 MG tablet, Take 10 mg by mouth daily., Disp: , Rfl:  .  Melatonin 10 MG TABS, Take 10 mg by mouth., Disp: , Rfl:  .  montelukast (SINGULAIR) 10 MG tablet, Take 10 mg by mouth at bedtime., Disp: , Rfl:  .  omeprazole (PRILOSEC) 20 MG capsule, Take 20 mg by mouth daily.,  Disp: , Rfl:  .  rOPINIRole (REQUIP) 4 MG tablet, TAKE 1 TO 2 TABLETS 1 TO 3 HOURS BEFORE BEDTIME (Patient taking differently: Take 8 mg by mouth. ), Disp: 180 tablet, Rfl: 0 .  sertraline (ZOLOFT) 100 MG tablet, 1/2 qd for 10 days, then 1 qd., Disp: 90 tablet, Rfl: 0 Medication Side Effects: none  Family Medical/ Social History: Changes? No  MENTAL HEALTH EXAM:  There were no vitals taken for this visit.There is no height or weight on file to calculate BMI.  General Appearance: Casual, Well Groomed and Obese  Eye Contact:  Good  Speech:  Clear and Coherent  Volume:  Normal  Mood:  Depressed  Affect:  Appropriate  Thought Process:  Goal Directed  Orientation:  Full (Time, Place, and Person)  Thought Content: Logical   Suicidal Thoughts:  No  Homicidal Thoughts:  No  Memory:  WNL  Judgement:  Good  Insight:  Good  Psychomotor Activity:  Normal  Concentration:  Concentration: Good  Recall:  Good  Fund of Knowledge: Good  Language: Good  Assets:  Desire for Improvement  ADL's:  Intact  Cognition: WNL  Prognosis:  Good    DIAGNOSES:    ICD-10-CM   1. Generalized anxiety disorder F41.1   2. Major depressive disorder, recurrent episode, moderate (HCC) F33.1   3. Restless leg syndrome G25.81  4. Crohn's disease with complication, unspecified gastrointestinal tract location [facility]) K50.919     Receiving Psychotherapy: No    RECOMMENDATIONS: Wean Cymbalta 60 mg by taking 130 mg pill p.o. daily for 10 days, and then open the And sprinkle approximately half of the powder and take a teaspoon of pudding or applesauce.  Do that for 7 to 10 days and then stop. Restart Zoloft 100 mg, half p.o. daily for 10 days and then 1 p.o. daily. Continue Klonopin 0.5 mg twice daily as needed.  If she needs 3 times daily that is fine and if she is about to run out call and I will give her more. Continue ropinirole 4 mg, 2 nightly. Return in 4 weeks.  [name], PA-C   This record has been  created using [employer].  Chart creation errors have been sought, but may not always have been located and corrected. Such creation errors do not reflect on the standard of medical care.

## 2018-08-12 ENCOUNTER — Encounter: Payer: Self-pay | Admitting: Physician Assistant

## 2018-08-12 ENCOUNTER — Other Ambulatory Visit: Payer: Self-pay

## 2018-08-12 ENCOUNTER — Ambulatory Visit: Payer: BC Managed Care – PPO | Admitting: Physician Assistant

## 2018-08-12 DIAGNOSIS — F331 Major depressive disorder, recurrent, moderate: Secondary | ICD-10-CM

## 2018-08-12 DIAGNOSIS — F411 Generalized anxiety disorder: Secondary | ICD-10-CM

## 2018-08-12 DIAGNOSIS — G2581 Restless legs syndrome: Secondary | ICD-10-CM | POA: Diagnosis not present

## 2018-08-12 NOTE — Progress Notes (Signed)
[facility]  Patient ID: Traci [name],  MRN: [id number]  PCP: Algis Greenhouse, MD  Date of Evaluation: 08/12/2018 Time spent:15 minutes  Chief Complaint:  Chief Complaint    Follow-up     Virtual Visit via Telephone Note  I connected with Traci [name] on 08/12/18 at  9:00 AM EDT by telephone and verified that I am speaking with the correct person using two identifiers.   I discussed the limitations, risks, security and privacy concerns of performing an evaluation and management service by telephone and the availability of in person appointments. I also discussed with the patient that there may be a patient responsible charge related to this service. The patient expressed understanding and agreed to proceed.    HISTORY/CURRENT STATUS: HPI For 4 week med check.  Doing much better!  Going off the Cymbalta and going back on Zoloft has been a good thing.  She does not feel nearly as anxious or as depressed as she did on the Cymbalta.  She had no withdrawal effects to speak of.  On the day after she took the last Cymbalta, she had a couple of hours of feeling dizzy and having chills, but it went away quickly and has not happened again.  Patient denies loss of interest in usual activities and is able to enjoy things.  Denies decreased energy or motivation.  Appetite has not changed.  No extreme sadness, tearfulness, or feelings of hopelessness.  Denies any changes in concentration, making decisions or remembering things.  Denies suicidal or homicidal thoughts.  She is having some anxiety still but not as bad as before.  She is not needing the Klonopin as much as she did.  States that the anxiety is definitely related to the coronavirus pandemic.  Her hours have been cut at work at [employer], and because of her immunocompromised state, she has to wear a mask all the time.  So those things can cause some anxiety, but she still better than she was before.  She was on a  higher dose of Zoloft in the past, so she decided on her own to go ahead and increase the dose to 150 mg.  States that because of everything going on is anxiety provoking that it would be in her best interest to go ahead and increase.  Denies muscle or joint pain, stiffness, or dystonia.  Her restless legs are still treated well with the ropinirole.  Denies dizziness, syncope, seizures, numbness, tingling, tremor, tics, unsteady gait, slurred speech, confusion.   Individual Medical History/ Review of Systems: Changes? :No    Past medications for mental health diagnoses include: Zoloft, Xanax, Wellbutrin, Lexapro, Effexor, Luvox, Cymbalta caused more anxiety then depression.  Allergies: Wellbutrin [bupropion]  Current Medications:  Current Outpatient Medications:  .  azaTHIOprine (IMURAN) 50 MG tablet, Take 125 mg by mouth daily., Disp: , Rfl:  .  clonazePAM (KLONOPIN) 0.5 MG tablet, Take 0.5 mg by mouth 2 (two) times daily as needed for anxiety. 1/2 - 1 po bid prn, Disp: , Rfl:  .  folic acid (FOLVITE) 1 MG tablet, Take 1 mg by mouth daily., Disp: , Rfl:  .  inFLIXimab (REMICADE IV), Inject into the vein., Disp: , Rfl:  .  loratadine (CLARITIN) 10 MG tablet, Take 10 mg by mouth daily., Disp: , Rfl:  .  Melatonin 10 MG TABS, Take 10 mg by mouth., Disp: , Rfl:  .  montelukast (SINGULAIR) 10 MG tablet, Take 10 mg by mouth at bedtime.,  Disp: , Rfl:  .  omeprazole (PRILOSEC) 20 MG capsule, Take 20 mg by mouth daily., Disp: , Rfl:  .  rOPINIRole (REQUIP) 4 MG tablet, TAKE 1 TO 2 TABLETS 1 TO 3 HOURS BEFORE BEDTIME (Patient taking differently: Take 8 mg by mouth. ), Disp: 180 tablet, Rfl: 0 .  sertraline (ZOLOFT) 100 MG tablet, 1/2 qd for 10 days, then 1 qd. (Patient taking differently: 150 mg. 1/2 qd for 10 days, then 1 qd.), Disp: 90 tablet, Rfl: 0 Medication Side Effects: none  Family Medical/ Social History: Changes? Yes see above  MENTAL HEALTH EXAM:  There were no vitals taken for this  visit.There is no height or weight on file to calculate BMI.  General Appearance: Phone visit, unable to assess  Eye Contact:  Unable to assess  Speech:  Clear and Coherent  Volume:  Normal  Mood:  Euthymic  Affect:  Unable to assess  Thought Process:  Goal Directed  Orientation:  Full (Time, Place, and Person)  Thought Content: Logical   Suicidal Thoughts:  No  Homicidal Thoughts:  No  Memory:  WNL  Judgement:  Good  Insight:  Good  Psychomotor Activity:  Unable to assess  Concentration:  Concentration: Good  Recall:  Good  Fund of Knowledge: Good  Language: Good  Assets:  Desire for Improvement  ADL's:  Intact  Cognition: WNL  Prognosis:  Good    DIAGNOSES:    ICD-10-CM   1. Generalized anxiety disorder F41.1   2. Major depressive disorder, recurrent episode, moderate (HCC) F33.1   3. Restless leg syndrome G25.81     Receiving Psychotherapy: No    RECOMMENDATIONS:  Continue Zoloft 150 mg daily. Continue Klonopin 0.5 mg 1 twice daily as needed. Continue melatonin 10 mg nightly as needed. Continue ropinirole 4 mg, 2 p.o. nightly. Return in approximately 6 weeks.  [name], PA-C   This record has been created using [employer].  Chart creation errors have been sought, but may not always have been located and corrected. Such creation errors do not reflect on the standard of medical care.

## 2018-08-18 ENCOUNTER — Other Ambulatory Visit: Payer: Self-pay | Admitting: Physician Assistant

## 2018-08-19 NOTE — Telephone Encounter (Signed)
Visit on 08/12/2018

## 2018-09-24 ENCOUNTER — Encounter: Payer: Self-pay | Admitting: Physician Assistant

## 2018-09-24 ENCOUNTER — Ambulatory Visit (INDEPENDENT_AMBULATORY_CARE_PROVIDER_SITE_OTHER): Payer: BC Managed Care – PPO | Admitting: Physician Assistant

## 2018-09-24 ENCOUNTER — Other Ambulatory Visit: Payer: Self-pay

## 2018-09-24 DIAGNOSIS — F331 Major depressive disorder, recurrent, moderate: Secondary | ICD-10-CM | POA: Diagnosis not present

## 2018-09-24 DIAGNOSIS — F411 Generalized anxiety disorder: Secondary | ICD-10-CM | POA: Diagnosis not present

## 2018-09-24 DIAGNOSIS — G2581 Restless legs syndrome: Secondary | ICD-10-CM

## 2018-09-24 MED ORDER — ROPINIROLE HCL 4 MG PO TABS: 8.0000 mg | ORAL_TABLET | Freq: Every day | ORAL | 1 refills | Status: DC

## 2018-09-24 MED ORDER — LAMOTRIGINE 25 MG PO TABS: ORAL_TABLET | ORAL | 0 refills | Status: DC

## 2018-09-24 MED ORDER — SERTRALINE HCL 100 MG PO TABS: 200.0000 mg | ORAL_TABLET | Freq: Every day | ORAL | 1 refills | Status: DC

## 2018-09-24 MED ORDER — CLONAZEPAM 0.5 MG PO TABS: ORAL_TABLET | ORAL | 0 refills | Status: DC

## 2018-09-24 NOTE — Progress Notes (Signed)
[facility]  Patient ID: Traci [name],  MRN: [id number]  PCP: Algis Greenhouse, MD  Date of Evaluation: 09/24/2018 Time spent:25 minutes  Chief Complaint:  Chief Complaint    Follow-up     Virtual Visit via Telephone Note  I connected with patient by a video enabled telemedicine application or telephone, with their informed consent, and verified patient privacy and that I am speaking with the correct person using two identifiers.  I am private, in my home and the patient is home.   I discussed the limitations, risks, security and privacy concerns of performing an evaluation and management service by telephone and the availability of in person appointments. I also discussed with the patient that there may be a patient responsible charge related to this service. The patient expressed understanding and agreed to proceed.   I discussed the assessment and treatment plan with the patient. The patient was provided an opportunity to ask questions and all were answered. The patient agreed with the plan and demonstrated an understanding of the instructions.   The patient was advised to call back or seek an in-person evaluation if the symptoms worsen or if the condition fails to improve as anticipated.  I provided 25 minutes of non-face-to-face time during this encounter.  HISTORY/CURRENT STATUS: HPI For routine med check.  Not doing well.  Constant worry and has obsessive thoughts.  "When I get a thought in my head, I cannot let it go.  I feel just like I did when I was on the Zoloft there toward the end, before we decided to change to the Cymbalta.  I wish I could have tolerated the Cymbalta because it did help with my joint aches and pains that I did not even know I had.  But it made me feel so awful mentally."  Klonopin helps a little bit but does not take the anxiety away.  She does not take it routinely, but if work is stressful she will take half a pill in the morning and  again at lunch.  And sometimes she will take a whole pill at bedtime.  States she feels sad almost all the time.  Cries easily.  Feels hopeless, like she is not good enough.  She denies suicidal thoughts but at the same time will often feel like "what good and my anybody."  Denies a plan.  Reports that she might want to have a baby.  She and her husband are not trying.  "That something else I worry about.  I want to get my mental health straight before I try to get pregnant.  And with all of my physical problems I know I will have to be on medication even though I do not want to be."  Patient denies increased energy with decreased need for sleep, no increased talkativeness, no racing thoughts, no impulsivity or risky behaviors, no increased spending, no increased libido, no grandiosity.  Denies dizziness, syncope, seizures, numbness, tingling, tremor, tics, unsteady gait, slurred speech, confusion. Denies muscle pain, stiffness, or dystonia.  Individual Medical History/ Review of Systems: Changes? :No    Past medications for mental health diagnoses include: Zoloft, Xanax, Wellbutrin, Lexapro, Effexor, Luvox, Cymbalta caused more anxiety then depression.  Allergies: Wellbutrin [bupropion]  Current Medications:  Current Outpatient Medications:  .  azaTHIOprine (IMURAN) 50 MG tablet, Take 125 mg by mouth daily., Disp: , Rfl:  .  clonazePAM (KLONOPIN) 0.5 MG tablet, TAKE 1/2 TO 1 TABLET TWICE DAILY AS NEEDED, Disp: 180 tablet, Rfl:  0 .  folic acid (FOLVITE) 1 MG tablet, Take 1 mg by mouth daily., Disp: , Rfl:  .  inFLIXimab (REMICADE IV), Inject into the vein., Disp: , Rfl:  .  loratadine (CLARITIN) 10 MG tablet, Take 10 mg by mouth daily., Disp: , Rfl:  .  Melatonin 10 MG TABS, Take 10 mg by mouth., Disp: , Rfl:  .  montelukast (SINGULAIR) 10 MG tablet, Take 10 mg by mouth at bedtime., Disp: , Rfl:  .  omeprazole (PRILOSEC) 20 MG capsule, Take 20 mg by mouth daily., Disp: , Rfl:  .   rOPINIRole (REQUIP) 4 MG tablet, Take 2 tablets (8 mg total) by mouth at bedtime., Disp: 180 tablet, Rfl: 1 .  sertraline (ZOLOFT) 100 MG tablet, Take 2 tablets (200 mg total) by mouth daily., Disp: 180 tablet, Rfl: 1 .  lamoTRIgine (LAMICTAL) 25 MG tablet, 1 po qhs for 2 weeks, then 2 po qhs., Disp: 60 tablet, Rfl: 0 Medication Side Effects: none  Family Medical/ Social History: Changes? Yes working full time again (at [employer])   [place]:  There were no vitals taken for this visit.There is no height or weight on file to calculate BMI.  General Appearance: unable to assess  Eye Contact:  unable to assess  Speech:  Clear and Coherent  Volume:  Normal  Mood:  Euthymic  Affect:  unable to assess  Thought Process:  Goal Directed  Orientation:  Full (Time, Place, and Person)  Thought Content: Logical   Suicidal Thoughts:  No  Homicidal Thoughts:  No  Memory:  WNL  Judgement:  Good  Insight:  Good  Psychomotor Activity:  unable to assess  Concentration:  Concentration: Good  Recall:  Good  Fund of Knowledge: Good  Language: Good  Assets:  Desire for Improvement  ADL's:  Intact  Cognition: WNL  Prognosis:  Good    DIAGNOSES:    ICD-10-CM   1. Major depressive disorder, recurrent episode, moderate (HCC) F33.1   2. Generalized anxiety disorder F41.1   3. Restless leg syndrome G25.81     Receiving Psychotherapy: No    RECOMMENDATIONS: I spent 25 minutes with her and 50% of that time was in counseling in regards to differential diagnosis and treatment options. In light of the fact that she hopes to have a baby in the near future, I recommend that we start Lamictal.  That will help with depression and is safe in pregnancy as far as we know.  We discussed benefits, risks, and possible side effects including rash.  She is aware to let me know immediately if she does get a rash.  She understands it can be serious and she is to not increase the medication without  discussing with me first. As far as her other psychiatric drugs go, we will discussed that later.  Klonopin and ropinirole would be best not to be on during pregnancy.  However with her medical problems as well as mental health problems, I would want to discuss with her OB before making any changes. Start Lamictal 25 mg p.o. nightly for 2 weeks, then increase to 2 pills nightly until the next visit. Continue Klonopin 0.5 mg, I have recommended that she take one half in the morning, one half around lunch, one half around suppertime and then 1 at bedtime all PRN.  I understand she may run out of her prescription sooner and it will be fine to refill early. Continue ropinirole 4 mg, 2 nightly. Continue Zoloft  200 mg every morning. Recommend psychotherapy. Return in 4 weeks.   [name], PA-C   This record has been created using [employer].  Chart creation errors have been sought, but may not always have been located and corrected. Such creation errors do not reflect on the standard of medical care.

## 2018-10-29 ENCOUNTER — Ambulatory Visit (INDEPENDENT_AMBULATORY_CARE_PROVIDER_SITE_OTHER): Payer: BC Managed Care – PPO | Admitting: Physician Assistant

## 2018-10-29 ENCOUNTER — Encounter: Payer: Self-pay | Admitting: Physician Assistant

## 2018-10-29 ENCOUNTER — Other Ambulatory Visit: Payer: Self-pay

## 2018-10-29 DIAGNOSIS — F3281 Premenstrual dysphoric disorder: Secondary | ICD-10-CM

## 2018-10-29 DIAGNOSIS — F331 Major depressive disorder, recurrent, moderate: Secondary | ICD-10-CM | POA: Diagnosis not present

## 2018-10-29 DIAGNOSIS — F411 Generalized anxiety disorder: Secondary | ICD-10-CM | POA: Diagnosis not present

## 2018-10-29 DIAGNOSIS — G2581 Restless legs syndrome: Secondary | ICD-10-CM | POA: Diagnosis not present

## 2018-10-29 MED ORDER — LAMOTRIGINE 25 MG PO TABS: 75.0000 mg | ORAL_TABLET | Freq: Every day | ORAL | 0 refills | Status: DC

## 2018-10-29 NOTE — Progress Notes (Signed)
[facility]  Patient ID: Traci [name],  MRN: [id number]  PCP: Algis Greenhouse, MD  Date of Evaluation: 10/29/2018 Time spent:15 minutes  Chief Complaint:  Chief Complaint    Depression; Follow-up     Virtual Visit via Telephone Note  I connected with patient by a video enabled telemedicine application or telephone, with their informed consent, and verified patient privacy and that I am speaking with the correct person using two identifiers.  I am private, in my office and the patient is in her car.   I discussed the limitations, risks, security and privacy concerns of performing an evaluation and management service by telephone and the availability of in person appointments. I also discussed with the patient that there may be a patient responsible charge related to this service. The patient expressed understanding and agreed to proceed.   I discussed the assessment and treatment plan with the patient. The patient was provided an opportunity to ask questions and all were answered. The patient agreed with the plan and demonstrated an understanding of the instructions.   The patient was advised to call back or seek an in-person evaluation if the symptoms worsen or if the condition fails to improve as anticipated.  I provided 15 minutes of non-face-to-face time during this encounter.  HISTORY/CURRENT STATUS: HPI For routine f/u after starting Lamictal.  Her husband Traci [name] is also on the phone with her.  1 month ago, we started Lamictal.  She is now on 50 mg daily and has been for about 2 weeks.  She is tolerating it well and has had no rash.  States she feels about 50% better!  Traci [name] says he thinks she is maybe 65% to 70% better!  She has been less depressed and her mood seems more stable.  She has not been feeling well for the past few days but she is having PMS right now.  She is not sleeping as well this week and feels more down in the dumps.  This is common for her and  we have discussed it in the past but have not increased the SSRI due to other medication trials.  She is able to enjoy things.  Her energy and motivation are good right now.  She is not isolating.  Work is going well.  She is not crying as easily as she was before.  She denies suicidal or homicidal thoughts.  Anxiety is well controlled with the Klonopin.  She is usually taking no more than 1 mg total daily.  Denies dizziness, syncope, seizures, numbness, tingling, tremor, tics, unsteady gait, slurred speech, confusion. Denies muscle or joint pain, stiffness, or dystonia.  Individual Medical History/ Review of Systems: Changes? :No    Past medications for mental health diagnoses include: Zoloft, Xanax, Wellbutrin, Lexapro, Effexor, Luvox, Cymbaltacaused more anxiety then depression  Allergies: Wellbutrin [bupropion]  Current Medications:  Current Outpatient Medications:  .  azaTHIOprine (IMURAN) 50 MG tablet, Take 125 mg by mouth daily., Disp: , Rfl:  .  clonazePAM (KLONOPIN) 0.5 MG tablet, 1/2 po q am, 1/2 at lunch, 1/2 at supper, and 1 qhs all prn, Disp: 180 tablet, Rfl: 0 .  folic acid (FOLVITE) 1 MG tablet, Take 1 mg by mouth daily., Disp: , Rfl:  .  inFLIXimab (REMICADE IV), Inject into the vein., Disp: , Rfl:  .  loratadine (CLARITIN) 10 MG tablet, Take 10 mg by mouth daily., Disp: , Rfl:  .  montelukast (SINGULAIR) 10 MG tablet, Take 10 mg by mouth at  bedtime., Disp: , Rfl:  .  omeprazole (PRILOSEC) 20 MG capsule, Take 20 mg by mouth daily., Disp: , Rfl:  .  rOPINIRole (REQUIP) 4 MG tablet, Take 2 tablets (8 mg total) by mouth at bedtime., Disp: 180 tablet, Rfl: 1 .  sertraline (ZOLOFT) 100 MG tablet, Take 2 tablets (200 mg total) by mouth daily., Disp: 180 tablet, Rfl: 1 .  ferrous sulfate 325 (65 FE) MG tablet, Take by mouth., Disp: , Rfl:  .  lamoTRIgine (LAMICTAL) 25 MG tablet, Take 3 tablets (75 mg total) by mouth daily., Disp: 90 tablet, Rfl: 0 .  Melatonin 10 MG TABS, Take  10 mg by mouth., Disp: , Rfl:  Medication Side Effects: none  Family Medical/ Social History: Changes? No  MENTAL HEALTH EXAM:  There were no vitals taken for this visit.There is no height or weight on file to calculate BMI.  General Appearance: unable to assess  Eye Contact:  unable to assess  Speech:  Clear and Coherent  Volume:  Normal  Mood:  Euthymic  Affect:  unable to assess  Thought Process:  Goal Directed  Orientation:  Full (Time, Place, and Person)  Thought Content: Logical   Suicidal Thoughts:  No  Homicidal Thoughts:  No  Memory:  WNL  Judgement:  Good  Insight:  Good  Psychomotor Activity:  unable to assess  Concentration:  Concentration: Good  Recall:  Good  Fund of Knowledge: Good  Language: Good  Assets:  Desire for Improvement  ADL's:  Intact  Cognition: WNL  Prognosis:  Good    DIAGNOSES:    ICD-10-CM   1. Major depressive disorder, recurrent episode, moderate (HCC)  F33.1   2. Generalized anxiety disorder  F41.1   3. Restless leg syndrome  G25.81   4. PMDD (premenstrual dysphoric disorder)  F32.81     Receiving Psychotherapy: No    RECOMMENDATIONS: I am glad to hear that she is doing so much better!  I do recommend increasing the Lamictal however.  I think we can get to at least 75% improvement on a consistent basis. Increase Lamictal to 75 mg daily.  She can call in about 2 weeks and let me know how she is doing.  If not much better, I will increase the dose to 100 mg and we will need to send in a prescription to [facility] on [address].  If she is doing a lot better, will leave it at 75 mg daily. Continue Zoloft 200 mg daily.  Increase to 250 mg daily for the 5 to 7 days prior to her menses.  Once menses starts, decrease back to 200 mg daily.  She and her husband both understand. Continue Klonopin 0.5 mg 1 every morning, daily at lunch, daily at supper and 2 nightly all PRN. Continue ropinirole 4 mg, 2 nightly. Exercise, diet  discussed. Return in 6 weeks.  [name], PA-C   This record has been created using [employer].  Chart creation errors have been sought, but may not always have been located and corrected. Such creation errors do not reflect on the standard of medical care.

## 2018-11-14 ENCOUNTER — Telehealth: Payer: Self-pay | Admitting: Physician Assistant

## 2018-11-14 ENCOUNTER — Other Ambulatory Visit: Payer: Self-pay | Admitting: Physician Assistant

## 2018-11-14 MED ORDER — LAMOTRIGINE 100 MG PO TABS: 100.0000 mg | ORAL_TABLET | Freq: Every day | ORAL | 1 refills | Status: DC

## 2018-11-14 NOTE — Telephone Encounter (Signed)
Laela called to report to you about the lamictal.  She is on 40m and you discussed increasing to 1018m  She would like to increase.  Next appt 12/22/18.  [facility] [address]

## 2018-11-14 NOTE — Telephone Encounter (Signed)
I sent it in 

## 2018-11-20 ENCOUNTER — Other Ambulatory Visit: Payer: Self-pay | Admitting: Physician Assistant

## 2018-12-02 ENCOUNTER — Encounter (INDEPENDENT_AMBULATORY_CARE_PROVIDER_SITE_OTHER): Payer: Self-pay | Admitting: Family Medicine

## 2018-12-02 ENCOUNTER — Ambulatory Visit (INDEPENDENT_AMBULATORY_CARE_PROVIDER_SITE_OTHER): Payer: BC Managed Care – PPO | Admitting: Family Medicine

## 2018-12-02 ENCOUNTER — Other Ambulatory Visit: Payer: Self-pay

## 2018-12-02 VITALS — BP 136/87 | HR 105 | Temp 98.2°F | Ht 67.0 in | Wt 297.0 lb

## 2018-12-02 DIAGNOSIS — F418 Other specified anxiety disorders: Secondary | ICD-10-CM | POA: Diagnosis not present

## 2018-12-02 DIAGNOSIS — Z9189 Other specified personal risk factors, not elsewhere classified: Secondary | ICD-10-CM

## 2018-12-02 DIAGNOSIS — E559 Vitamin D deficiency, unspecified: Secondary | ICD-10-CM

## 2018-12-02 DIAGNOSIS — R0602 Shortness of breath: Secondary | ICD-10-CM

## 2018-12-02 DIAGNOSIS — E538 Deficiency of other specified B group vitamins: Secondary | ICD-10-CM

## 2018-12-02 DIAGNOSIS — E66813 Obesity, class 3: Secondary | ICD-10-CM

## 2018-12-02 DIAGNOSIS — Z0289 Encounter for other administrative examinations: Secondary | ICD-10-CM

## 2018-12-02 DIAGNOSIS — R5383 Other fatigue: Secondary | ICD-10-CM

## 2018-12-02 DIAGNOSIS — Z6841 Body Mass Index (BMI) 40.0 and over, adult: Secondary | ICD-10-CM

## 2018-12-02 NOTE — Progress Notes (Signed)
Office: [phone]  /  Fax: [phone]   Dear Dr. Orland Mustard,   Thank you for referring Traci [name] to our clinic. The following note includes my evaluation and treatment recommendations.  HPI:   Chief Complaint: OBESITY    Traci [name] has been referred by Traci Pepper, MD for consultation regarding her obesity and obesity related comorbidities.    Traci [name] (MR# [id number]) is a 38 y.o. female who presents on 12/02/2018 for obesity evaluation and treatment. Current BMI is Body mass index is 46.52 kg/m. Traci [name] has been struggling with her weight for many years and has been unsuccessful in either losing weight, maintaining weight loss, or reaching her healthy weight goal.     Traci [name] attended our information session and states she is currently in the action stage of change and ready to dedicate time achieving and maintaining a healthier weight. Traci [name] is interested in becoming our patient and working on intensive lifestyle modifications including (but not limited to) diet, exercise and weight loss.    Traci [name] states her family eats meals together she thinks her family will eat healthier with  her her desired weight loss is 97 lbs she has been heavy most of  her life she started gaining weight the past 2-3 years her heaviest weight ever was 297 lbs she has significant food cravings issues  she snacks frequently in the evenings she is frequently drinking liquids with calories she frequently makes poor food choices she frequently eats larger portions than normal  she struggles with emotional eating    Fatigue Traci [name] feels her energy is lower than it should be. This has worsened with weight gain and has not worsened recently. Traci [name] admits to daytime somnolence and  admits to waking up still tired. Patient is at risk for obstructive sleep apnea. Patent has a history of symptoms of daytime fatigue. Patient generally gets 6 hours of sleep per night, and states  they generally have generally restful sleep with sleep medicine. Snoring is present. Apneic episodes are not present. Epworth Sleepiness Score is 7.  Dyspnea on exertion Traci [name] notes increasing shortness of breath with exercising and seems to be worsening over time with weight gain. She notes getting out of breath sooner with activity than she used to. This has not gotten worse recently. Traci [name] denies orthopnea.  Vitamin D Deficiency Traci [name] has a diagnosis of vitamin D deficiency. She has a history of Crohn's disease. She is on OTC Vit D and has no recent labs. She notes fatigue and denies nausea, vomiting or muscle weakness.  Vitamin B12 Deficiency Traci [name] has a diagnosis of B12 insufficiency and notes fatigue. She has a history Crohn's disease and is status post partial bowel resection. She is on B12 injections, but doesn't take regularly.  Depression with Anxiety Traci [name] is on multiple medications, and she feels she is stable overall with her mood but definitely does emotional eating. She is using food for comfort to the extent that it is negatively impacting her health. She often snacks when she is not hungry. Traci [name] sometimes feels she is out of control and then feels guilty that she made poor food choices. She has been working on behavior modification techniques to help reduce her emotional eating and has been somewhat successful. She shows no sign of suicidal or homicidal ideations.  Depression Screen Traci [name]'s Food and Mood (modified PHQ-9) score was  Depression screen PHQ 2/9 12/02/2018  Decreased Interest 2  Down, Depressed, Hopeless 3  PHQ - 2 Score 5  Altered sleeping 2  Tired, decreased energy 2  Change in appetite 2  Feeling bad or failure about yourself  2  Trouble concentrating 1  Moving slowly or fidgety/restless 0  Suicidal thoughts 0  PHQ-9 Score 14  Difficult doing work/chores Somewhat difficult   At risk for cardiovascular disease Traci [name] is at  a higher than average risk for cardiovascular disease due to obesity. She currently denies any chest pain.  ASSESSMENT AND PLAN:  Other fatigue - Plan: EKG 12-Lead, CBC With Differential, Comprehensive metabolic panel, Folate, Hemoglobin A1c, Insulin, random, Lipid Panel With LDL/HDL Ratio, T3, T4, free, TSH  SOB (shortness of breath) on exertion - Plan: CBC With Differential, Comprehensive metabolic panel, Folate, Hemoglobin A1c, Insulin, random, Lipid Panel With LDL/HDL Ratio, T3, T4, free, TSH  Vitamin D deficiency - Plan: VITAMIN D 25 Hydroxy (Vit-D Deficiency, Fractures)  B12 deficiency - Plan: Vitamin B12, Folate  Depression with anxiety  At risk for heart disease  Class 3 severe obesity with serious comorbidity and body mass index (BMI) of 45.0 to 49.9 in adult, unspecified obesity type (HCC)  PLAN:  Fatigue Traci [name] was informed that her fatigue may be related to obesity, depression or many other causes. Labs will be ordered, and in the meanwhile Traci [name] has agreed to work on diet, exercise and weight loss to help with fatigue. Proper sleep hygiene was discussed including the need for 7-8 hours of quality sleep each night. A sleep study was not ordered based on symptoms and Epworth score.  Dyspnea on exertion Traci [name]'s shortness of breath appears to be obesity related and exercise induced. She has agreed to work on weight loss and gradually increase exercise to treat her exercise induced shortness of breath. If Traci [name] follows our instructions and loses weight without improvement of her shortness of breath, we will plan to refer to pulmonology. We will monitor this condition regularly. Traci [name] agrees to this plan.  Vitamin D Deficiency Traci [name] was informed that low vitamin D levels contributes to fatigue and are associated with obesity, breast, and colon cancer. Traci [name] agrees to continue taking OTC Vit D and will follow up for routine testing of vitamin D, at least  2-3 times per year. She was informed of the risk of over-replacement of vitamin D and agrees to not increase her dose unless she discusses this with [place] first. We will check labs today. Kanika agrees to follow up with our clinic in 2 weeks.  Vitamin B12 Deficiency Traci [name] is to start B12 rich diet. B12 supplementation was not prescribed today. We will check labs today. Traci [name] agrees to follow up with our clinic in 2 weeks.  Depression with Anxiety We discussed behavior modification techniques today to help Azlynn deal with her emotional eating and depression. Daisie agrees to continue her medications, and we will refer to Dr. Mallie Mussel, our Bariatric Psychologist for evaluation and therapy. Demetric agrees to follow up with our clinic in 2 weeks.  Depression Screen Aliyana had a moderately positive depression screening. Depression is commonly associated with obesity and often results in emotional eating behaviors. We will monitor this closely and work on CBT to help improve the non-hunger eating patterns. Referral to Psychology may be required if no improvement is seen as she continues in our clinic.  Cardiovascular risk counseling Alaia was given extended (30 minutes) coronary artery disease prevention counseling today. She is 38 y.o. female and has risk factors for heart disease including obesity. We discussed intensive lifestyle modifications today with an emphasis on specific weight loss instructions and strategies.  Pt was also informed of the importance of increasing exercise and decreasing saturated fats to help prevent heart disease.  Obesity Dazja is currently in the action stage of change and her goal is to continue with weight loss efforts. I recommend Myleka begin the structured treatment plan as follows:  She has agreed to follow the Category 3 plan Destinie has been instructed to eventually work up to a goal of 150 minutes of combined cardio and strengthening  exercise per week for weight loss and overall health benefits. We discussed the following Behavioral Modification Strategies today: increasing lean protein intake, decreasing simple carbohydrates  and work on meal planning and easy cooking plans   She was informed of the importance of frequent follow up visits to maximize her success with intensive lifestyle modifications for her multiple health conditions. She was informed we would discuss her lab results at her next visit unless there is a critical issue that needs to be addressed sooner. Tammey agreed to keep her next visit at the agreed upon time to discuss these results.  ALLERGIES: Allergies  Allergen Reactions   Wellbutrin [Bupropion] Anxiety   Adhesive [Tape]     MEDICATIONS: Current Outpatient Medications on File Prior to Visit  Medication Sig Dispense Refill   acetaminophen (TYLENOL) 325 MG tablet Take 650 mg by mouth every 6 (six) hours as needed.     azaTHIOprine (IMURAN) 50 MG tablet Take 125 mg by mouth daily.     clonazePAM (KLONOPIN) 0.5 MG tablet TAKE 1/2 TO 1 TABLET BY MOUTH TWICE DAILY AS NEEDED 180 tablet 0   cyanocobalamin (,VITAMIN B-12,) 1000 MCG/ML injection Inject 1,000 mcg into the muscle every 30 (thirty) days.     ferrous sulfate 325 (65 FE) MG tablet Take by mouth.     folic acid (FOLVITE) 1 MG tablet Take 1 mg by mouth daily.     ibuprofen (ADVIL) 200 MG tablet Take 200 mg by mouth every 6 (six) hours as needed.     inFLIXimab (REMICADE IV) Inject into the vein.     lamoTRIgine (LAMICTAL) 100 MG tablet Take 1 tablet (100 mg total) by mouth daily. 30 tablet 1   loratadine (CLARITIN) 10 MG tablet Take 10 mg by mouth daily.     Melatonin 10 MG TABS Take 10 mg by mouth.     montelukast (SINGULAIR) 10 MG tablet Take 10 mg by mouth at bedtime.     omeprazole (PRILOSEC) 20 MG capsule Take 20 mg by mouth daily.     rOPINIRole (REQUIP) 4 MG tablet Take 2 tablets (8 mg total) by mouth at bedtime.  180 tablet 1   sertraline (ZOLOFT) 100 MG tablet Take 2 tablets (200 mg total) by mouth daily. 180 tablet 1   No current facility-administered medications on file prior to visit.     PAST MEDICAL HISTORY: Past Medical History:  Diagnosis Date   Anemia    Anxiety    Blood clot in vein    Right lower leg after surgery   Crohn's disease (HCC)    Depression    Dyspnea    Elevated BP without diagnosis of hypertension    Enlarged liver    GERD (gastroesophageal reflux disease)    IBS (irritable bowel syndrome)    Laceration of left little finger 02/28/2018   Lower extremity edema    Restless legs    Seasonal allergies    Uterine fibroid    Vitamin B 12 deficiency    Vitamin D deficiency  PAST SURGICAL HISTORY: Past Surgical History:  Procedure Laterality Date   BOWEL RESECTION     BREAST BIOPSY     NASAL SINUS SURGERY      SOCIAL HISTORY: Social History   Tobacco Use   Smoking status: Never Smoker   Smokeless tobacco: Never Used  Substance Use Topics   Alcohol use: Never    Frequency: Never   Drug use: Never    FAMILY HISTORY: Family History  Problem Relation Age of Onset   Heart failure Other    Leukemia Paternal Grandmother    Hyperlipidemia Mother    Thyroid disease Mother    Anxiety disorder Mother    Depression Mother     ROS: Review of Systems  Constitutional: Positive for malaise/fatigue. Negative for weight loss.       + Trouble sleeping  Respiratory: Positive for shortness of breath (with exertion).   Cardiovascular: Negative for chest pain and orthopnea.  Gastrointestinal: Positive for diarrhea and heartburn. Negative for nausea and vomiting.  Musculoskeletal: Positive for back pain.       Negative muscle weakness + Muscle or joint pain  Neurological: Positive for headaches.  Psychiatric/Behavioral: Positive for depression. Negative for suicidal ideas. The patient is nervous/anxious.        + Anxiety +  Stress    PHYSICAL EXAM: Blood pressure 136/87, pulse (!) 105, temperature 98.2 F (36.8 C), temperature source Oral, height 5' 7"  (1.702 m), weight 297 lb (134.7 kg), last menstrual period 11/20/2018, SpO2 98 %. Body mass index is 46.52 kg/m. Physical Exam Vitals signs reviewed.  Constitutional:      Appearance: Normal appearance. She is obese.  HENT:     Head: Normocephalic and atraumatic.     Nose: Nose normal.  Eyes:     General: No scleral icterus.    Extraocular Movements: Extraocular movements intact.  Neck:     Musculoskeletal: Normal range of motion and neck supple.     Comments: No thyromegaly present Cardiovascular:     Rate and Rhythm: Regular rhythm. Tachycardia present.     Pulses: Normal pulses.     Heart sounds: Normal heart sounds.  Pulmonary:     Effort: Pulmonary effort is normal. No respiratory distress.     Breath sounds: Normal breath sounds.  Abdominal:     Palpations: Abdomen is soft.     Tenderness: There is no abdominal tenderness.     Comments: + Obesity  Musculoskeletal: Normal range of motion.     Right lower leg: No edema.     Left lower leg: No edema.  Skin:    General: Skin is warm and dry.  Neurological:     Mental Status: She is alert and oriented to person, place, and time.     Coordination: Coordination normal.  Psychiatric:        Mood and Affect: Mood normal.        Behavior: Behavior normal.     RECENT LABS AND TESTS: BMET No results found for: NA, K, CL, CO2, GLUCOSE, BUN, CREATININE, CALCIUM, GFRNONAA, GFRAA No results found for: HGBA1C No results found for: INSULIN CBC No results found for: WBC, RBC, HGB, HCT, PLT, MCV, MCH, MCHC, RDW, LYMPHSABS, MONOABS, EOSABS, BASOSABS Iron/TIBC/Ferritin/ %Sat No results found for: IRON, TIBC, FERRITIN, IRONPCTSAT Lipid Panel  No results found for: CHOL, TRIG, HDL, CHOLHDL, VLDL, LDLCALC, LDLDIRECT Hepatic Function Panel  No results found for: PROT, ALBUMIN, AST, ALT, ALKPHOS,  BILITOT, BILIDIR, IBILI No results found for: TSH  ECG  shows NSR with a rate of 111 BPM INDIRECT CALORIMETER done today shows a VO2 of 406 and a REE of 2823.  Her calculated basal metabolic rate is 3614 thus her basal metabolic rate is better than expected.       OBESITY BEHAVIORAL INTERVENTION VISIT  Today's visit was # 1   Starting weight: 297 lbs Starting date: 12/02/2018 Today's weight : 297 lbs  Today's date: 12/02/2018 Total lbs lost to date: 0    ASK: We discussed the diagnosis of obesity with Kathrynn Humble today and Jaslynn agreed to give [place] permission to discuss obesity behavioral modification therapy today.  ASSESS: Adilynn has the diagnosis of obesity and her BMI today is 46.51 Carlea is in the action stage of change   ADVISE: Skilynn was educated on the multiple health risks of obesity as well as the benefit of weight loss to improve her health. She was advised of the need for long term treatment and the importance of lifestyle modifications to improve her current health and to decrease her risk of future health problems.  AGREE: Multiple dietary modification options and treatment options were discussed and  Shaylie agreed to follow the recommendations documented in the above note.  ARRANGE: Hiilei was educated on the importance of frequent visits to treat obesity as outlined per CMS and USPSTF guidelines and agreed to schedule her next follow up appointment today.  I, Trixie Dredge, am acting as transcriptionist for Dennard Nip, MD I have reviewed the above documentation for accuracy and completeness, and I agree with the above. -Dennard Nip, MD

## 2018-12-02 NOTE — Progress Notes (Signed)
Office: [phone]  /  Fax: [phone]    Date: December 08, 2018   Appointment Start Time: 11:01am Duration: 44 minutes Provider: Glennie Isle, Psy.D. Type of Session: Intake for Individual Therapy  Location of Patient: Work Location of Provider: [facility] Type of Contact: Telepsychological Visit via [employer]  Informed Consent: Prior to proceeding with today's appointment, two pieces of identifying information were obtained from [place] to verify identity. In addition, Lilliahna's physical location at the time of this appointment was obtained. Jennavie reported she was at work and provided the address. In the event of technical difficulties, Sabrena shared a phone number she could be reached at. Benjamine Mola and this provider participated in today's telepsychological service. Also, Maricel denied anyone else being present in the room or on the WebEx appointment.   The provider's role was explained to Kathrynn Humble. The provider reviewed and discussed issues of confidentiality, privacy, and limits therein (e.g., reporting obligations). In addition to verbal informed consent, written informed consent for psychological services was obtained from [place] prior to the initial intake interview. Written consent included information concerning the practice, financial arrangements, and confidentiality and patients' rights. Since the clinic is not a 24/7 crisis center, mental health emergency resources were shared, and the provider explained MyChart, e-mail, voicemail, and/or other messaging systems should be utilized only for non-emergency reasons. This provider also explained that information obtained during appointments will be placed in [facility] medical record in a confidential manner and relevant information will be shared with other providers at [facility] that she meets with for coordination of care. Teah verbally acknowledged understanding of  the aforementioned, and agreed to use mental health emergency resources discussed if needed. Moreover, Yuliana agreed information may be shared with other [facility] providers as needed for coordination of care. By signing the service agreement document, Colene provided written consent for coordination of care.   Prior to initiating telepsychological services, Sireen was provided with an informed consent document, which included the development of a safety plan (i.e., an emergency contact and emergency resources) in the event of an emergency/crisis. Kippy expressed understanding of the rationale of the safety plan and provided consent for this provider to reach out to her emergency contact in the event of an emergency/crisis. Destina returned the completed consent form prior to today's appointment. This provider verbally reviewed the consent form during today's appointment prior to proceeding with the appointment. Marsha verbally acknowledged understanding that she is ultimately responsible for understanding her insurance benefits as it relates to reimbursement of telepsychological and in-person services. This provider also reviewed confidentiality, as it relates to telepsychological services, as well as the rationale for telepsychological services. More specifically, this provider's clinic is limiting in-person visits due to COVID-19. Therapeutic services will resume to in-person appointments once deemed appropriate. Jaylani expressed understanding regarding the rationale for telepsychological services. In addition, this provider explained the telepsychological services informed consent document would be considered an addendum to the initial consent document/service agreement. Elvira verbally consented to proceed.   Chief Complaint/HPI: Duaa was referred by Dr. Dennard Nip. During the initial appointment with Dr. Dennard Nip at [facility] on December 02, 2018, Malayzia reported experiencing the following: significant food cravings issues , snacking frequently in the evenings, frequently drinking liquids with calories, frequently making poor food choices, frequently eating larger portions than normal  and struggling with emotional eating. The note further indicated, "Deija is on multiple medications, and she feels she is  stable overall with her mood but definitely does emotional eating. She is using food for comfort to the extent that it is negatively impacting her health. She often snacks when she is not hungry. Edelmira sometimes feels she is out of control and then feels guilty that she made poor food choices. She has been working on behavior modification techniques to help reduce her emotional eating and has been somewhat successful. She shows no sign of suicidal or homicidal ideations."  During today's appointment, Herta reported "I just like to eat." Sapphire was verbally administered a questionnaire assessing various behaviors related to emotional eating. Catrina endorsed the following: overeat when you are celebrating, experience food cravings on a regular basis, eat certain foods when you are anxious, stressed, depressed, or your feelings are hurt, use food to help you cope with emotional situations, find food is comforting to you, overeat when you are worried about something, overeat frequently when you are bored or lonely, not worry about what you eat when you are in a good mood, overeat when you are alone, but eat much less when you are with other people and eat as a reward. She shared she craves homemade foods, such as pies, brownies, ice cream, and mac and cheese. Kodee is unsure of the onset of emotional eating and she described the current frequency as "daily." She denied a history of binge eating. Pelagia denied a history of restricting food intake, purging and engagement in other compensatory strategies, and has never been  diagnosed with an eating disorder. She also denied a history of treatment for emotional eating. Moreover, Uchenna indicated boredom and stress triggers emotional eating, whereas crafting (e.g., crochet, cross stitch, using Cricket) makes emotional eating better. Furthermore, Ivadell shared she suffers from anxiety.   Mental Status Examination:  Appearance: neat Behavior: cooperative Mood: anxious Affect: mood congruent Speech: normal in rate, volume, and tone Eye Contact: appropriate Psychomotor Activity: appropriate Thought Process: linear, logical, and goal directed  Content/Perceptual Disturbances: denies suicidal and homicidal ideation, plan, and intent and no hallucinations, delusions, bizarre thinking or behavior reported or observed Orientation: time, person, place and purpose of appointment Cognition/Sensorium: memory, attention, language, and fund of knowledge intact  Insight: good Judgment: good  Family & Psychosocial History: Tayley reported she is married and she does not have any children. 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. Additionally, Nahomy shared her highest level of education obtained is an associate's degree. Currently, Collin's social support system consists of her "huge family," husband, co-worker, and a few friends. Moreover, Keonda stated she resides with her husband.   Medical History:  Past Medical History:  Diagnosis Date   Anemia    Anxiety    Blood clot in vein    Right lower leg after surgery   Crohn's disease (HCC)    Depression    Dyspnea    Elevated BP without diagnosis of hypertension    Enlarged liver    GERD (gastroesophageal reflux disease)    IBS (irritable bowel syndrome)    Laceration of left little finger 02/28/2018   Lower extremity edema    Restless legs    Seasonal allergies    Uterine fibroid    Vitamin B 12 deficiency    Vitamin D deficiency    Past Surgical  History:  Procedure Laterality Date   BOWEL RESECTION     BREAST BIOPSY     NASAL SINUS SURGERY     Current Outpatient Medications on File Prior  to Visit  Medication Sig Dispense Refill   acetaminophen (TYLENOL) 325 MG tablet Take 650 mg by mouth every 6 (six) hours as needed.     azaTHIOprine (IMURAN) 50 MG tablet Take 125 mg by mouth daily.     clonazePAM (KLONOPIN) 0.5 MG tablet TAKE 1/2 TO 1 TABLET BY MOUTH TWICE DAILY AS NEEDED 180 tablet 0   cyanocobalamin (,VITAMIN B-12,) 1000 MCG/ML injection Inject 1,000 mcg into the muscle every 30 (thirty) days.     ferrous sulfate 325 (65 FE) MG tablet Take by mouth.     folic acid (FOLVITE) 1 MG tablet Take 1 mg by mouth daily.     ibuprofen (ADVIL) 200 MG tablet Take 200 mg by mouth every 6 (six) hours as needed.     inFLIXimab (REMICADE IV) Inject into the vein.     lamoTRIgine (LAMICTAL) 100 MG tablet Take 1 tablet (100 mg total) by mouth daily. 30 tablet 1   loratadine (CLARITIN) 10 MG tablet Take 10 mg by mouth daily.     Melatonin 10 MG TABS Take 10 mg by mouth.     montelukast (SINGULAIR) 10 MG tablet Take 10 mg by mouth at bedtime.     omeprazole (PRILOSEC) 20 MG capsule Take 20 mg by mouth daily.     rOPINIRole (REQUIP) 4 MG tablet Take 2 tablets (8 mg total) by mouth at bedtime. 180 tablet 1   sertraline (ZOLOFT) 100 MG tablet Take 2 tablets (200 mg total) by mouth daily. 180 tablet 1   No current facility-administered medications on file prior to visit.   Saarah denied a history of head injuries and loss of consciousness.   Mental Health History: Shaneca denied a history of therapeutic services. Dae denied a history of hospitalizations for psychiatric concerns. Lidiya initiated psychiatric services approximately 3 years ago with [facility] for medication management. She currently meets with [name], PA-C for medication management. Their last appointment was approximately one month ago  and their next appointment is on December 22, 2018. Currently, PA Hurst prescribed Zoloft, Requip, Lamictal, and Klonopin. She described the medications as helpful. She reported she is diagnosed with GAD. Previously, her PCP prescribed psychotropic medications. Chart review revealed a history of Xanax, Wellbutrin, Effexor, and Luvox, and Cymbalta. Avilyn endorsed a family history of mental health related concerns. She shared her paternal great aunt suffered from bipolar disorder. She added, "Many members of my family are on Zoloft" and her paternal grandmother was previously hospitalized for depression. Annette denied a trauma history, including psychological, physical  and sexual abuse, as well as neglect.   Miamarie described her typical mood as "fairly even." She added, "There are some ups and downs. I'm a very emotional person." Aside from concerns noted above and endorsed on the PHQ-9 and GAD-7, Jennica reported experiencing decreased motivation and hopelessness about her weight as well as worry about family "drama," work, COVID-19, and health. Mallarie endorsed "very rare" alcohol use. She denied tobacco use. She denied illicit/recreational substance use. Regarding caffeine intake, Nitika reported consuming 1 to 2 cups of coffee daily. She also shared she consumes 12 oz. can of Coke Zero daily. Furthermore, Benjamine Mola denied experiencing the following: hallucinations and delusions, paranoia, mania, crying spells and panic attacks. She also denied history of and current suicidal ideation, plan, and intent; history of and current homicidal ideation, plan, and intent; and history of and current engagement in self-harm.  The following strengths were reported by Benjamine Mola: very empathetic, caring, giving, and good friend.  The following strengths were observed by this provider: ability to express thoughts and feelings during the therapeutic session, ability to establish and benefit from a therapeutic  relationship, ability to learn and practice coping skills, willingness to work toward established goal(s) with the clinic and ability to engage in reciprocal conversation.  Legal History: Constancia denied a history of legal involvement.   Structured Assessment Results: The Patient Health Questionnaire-9 (PHQ-9) is a self-report measure that assesses symptoms and severity of depression over the course of the last two weeks. Madisun obtained a score of 7 suggesting mild depression. Kenetra finds the endorsed symptoms to be somewhat difficult. Little interest or pleasure in doing things 0  Feeling down, depressed, or hopeless 0  Trouble falling or staying asleep, or sleeping too much 0  Feeling tired or having little energy 3  Poor appetite or overeating 2  Feeling bad about yourself --- or that you are a failure or have let yourself or your family down 2  Trouble concentrating on things, such as reading the newspaper or watching television 0  Moving or speaking so slowly that other people could have noticed? Or the opposite --- being so fidgety or restless that you have been moving around a lot more than usual 0  Thoughts that you would be better off dead or hurting yourself in some way 0  PHQ-9 Score 7    The Generalized Anxiety Disorder-7 (GAD-7) is a brief self-report measure that assesses symptoms of anxiety over the course of the last two weeks. Mareesa obtained a score of 7 suggesting mild anxiety. Geraline finds the endorsed symptoms to be somewhat difficult. Feeling nervous, anxious, on edge 2  Not being able to stop or control worrying 1  Worrying too much about different things 1  Trouble relaxing 1  Being so restless that it's hard to sit still 0  Becoming easily annoyed or irritable 1  Feeling afraid as if something awful might happen 1  GAD-7 Score 7   Interventions: A chart review was conducted prior to the clinical intake interview. The PHQ-9, and GAD-7 were verbally  administered as well as a Mood and Food questionnaire to assess various behaviors related to emotional eating. Throughout session, empathic reflections and validation was provided. Continuing treatment with this provider was discussed and a treatment goal was established. Psychoeducation regarding emotional versus physical hunger was provided. Ramonica was sent a handout via e-mail to utilize between now and the next appointment to increase awareness of hunger patterns and subsequent eating. Benjamine Mola provided verbal consent during today's appointment for this provider to send the handout via e-mail.   Provisional DSM-5 Diagnosis: 296.31 (F33.0) Major Depressive Disorder, Recurrent Episode, Mild, With Anxious Distress  Plan: Tinita appears able and willing to participate as evidenced by collaboration on a treatment goal, engagement in reciprocal conversation, and asking questions as needed for clarification. The next appointment will be scheduled in two weeks, which will be via [employer]. The following treatment goal was established: decrease emotional eating. Once this provider's office resumes in-person appointments and it is deemed appropriate, Sharel will be notified. For the aforementioned goal, Holden can benefit from biweekly individual therapy sessions that are brief in duration for approximately four to six sessions. The treatment modality will be individual therapeutic services, including an eclectic therapeutic approach utilizing techniques from Cognitive Behavioral Therapy, Patient Centered Therapy, Dialectical Behavior Therapy, Acceptance and Commitment Therapy, Interpersonal Therapy, and Cognitive Restructuring. Therapeutic approach will include various interventions as appropriate, such as validation, support,  mindfulness, thought defusion, reframing, psychoeducation, values assessment, and role playing. This provider will regularly review the treatment plan and medical chart to keep  informed of status changes. Arnell expressed understanding and agreement with the initial treatment plan of care.

## 2018-12-03 LAB — T3: T3, Total: 140 ng/dL (ref 71–180)

## 2018-12-03 LAB — LIPID PANEL WITH LDL/HDL RATIO
Cholesterol, Total: 232 mg/dL — ABNORMAL HIGH (ref 100–199)
HDL: 60 mg/dL (ref >39)
LDL Calculated: 146 mg/dL — ABNORMAL HIGH (ref 0–99)
LDl/HDL Ratio: 2.4 ratio (ref 0.0–3.2)
Triglycerides: 132 mg/dL (ref 0–149)
VLDL Cholesterol Cal: 26 mg/dL (ref 5–40)

## 2018-12-03 LAB — TSH: TSH: 1.79 u[IU]/mL (ref 0.450–4.500)

## 2018-12-03 LAB — COMPREHENSIVE METABOLIC PANEL
ALT: 14 IU/L (ref 0–32)
AST: 27 IU/L (ref 0–40)
Albumin/Globulin Ratio: 1.8 (ref 1.2–2.2)
Albumin: 4.2 g/dL (ref 3.8–4.8)
Alkaline Phosphatase: 101 IU/L (ref 39–117)
BUN/Creatinine Ratio: 20 (ref 9–23)
BUN: 15 mg/dL (ref 6–20)
Bilirubin Total: 0.3 mg/dL (ref 0.0–1.2)
CO2: 22 mmol/L (ref 20–29)
Calcium: 9.3 mg/dL (ref 8.7–10.2)
Chloride: 103 mmol/L (ref 96–106)
Creatinine, Ser: 0.74 mg/dL (ref 0.57–1.00)
GFR calc Af Amer: 121 mL/min/{1.73_m2} (ref >59)
GFR calc non Af Amer: 105 mL/min/{1.73_m2} (ref >59)
Globulin, Total: 2.3 g/dL (ref 1.5–4.5)
Glucose: 91 mg/dL (ref 65–99)
Potassium: 4.4 mmol/L (ref 3.5–5.2)
Sodium: 141 mmol/L (ref 134–144)
Total Protein: 6.5 g/dL (ref 6.0–8.5)

## 2018-12-03 LAB — HEMOGLOBIN A1C
Est. average glucose Bld gHb Est-mCnc: 100 mg/dL
Hgb A1c MFr Bld: 5.1 % (ref 4.8–5.6)

## 2018-12-03 LAB — CBC WITH DIFFERENTIAL
Basophils Absolute: 0.1 10*3/uL (ref 0.0–0.2)
Basos: 1 %
EOS (ABSOLUTE): 0.2 10*3/uL (ref 0.0–0.4)
Eos: 4 %
Hematocrit: 42.8 % (ref 34.0–46.6)
Hemoglobin: 13.6 g/dL (ref 11.1–15.9)
Immature Grans (Abs): 0 10*3/uL (ref 0.0–0.1)
Immature Granulocytes: 0 %
Lymphocytes Absolute: 1.5 10*3/uL (ref 0.7–3.1)
Lymphs: 23 %
MCH: 27.9 pg (ref 26.6–33.0)
MCHC: 31.8 g/dL (ref 31.5–35.7)
MCV: 88 fL (ref 79–97)
Monocytes Absolute: 0.5 10*3/uL (ref 0.1–0.9)
Monocytes: 7 %
Neutrophils Absolute: 4.4 10*3/uL (ref 1.4–7.0)
Neutrophils: 65 %
RBC: 4.88 x10E6/uL (ref 3.77–5.28)
RDW: 15.4 % (ref 11.7–15.4)
WBC: 6.7 10*3/uL (ref 3.4–10.8)

## 2018-12-03 LAB — VITAMIN D 25 HYDROXY (VIT D DEFICIENCY, FRACTURES): Vit D, 25-Hydroxy: 11.4 ng/mL — ABNORMAL LOW (ref 30.0–100.0)

## 2018-12-03 LAB — T4, FREE: Free T4: 0.95 ng/dL (ref 0.82–1.77)

## 2018-12-03 LAB — FOLATE: Folate: 16.2 ng/mL (ref >3.0)

## 2018-12-03 LAB — VITAMIN B12: Vitamin B-12: 356 pg/mL (ref 232–1245)

## 2018-12-03 LAB — INSULIN, RANDOM: INSULIN: 11.7 u[IU]/mL (ref 2.6–24.9)

## 2018-12-08 ENCOUNTER — Ambulatory Visit (INDEPENDENT_AMBULATORY_CARE_PROVIDER_SITE_OTHER): Payer: BC Managed Care – PPO | Admitting: Psychology

## 2018-12-08 ENCOUNTER — Other Ambulatory Visit: Payer: Self-pay

## 2018-12-08 DIAGNOSIS — F330 Major depressive disorder, recurrent, mild: Secondary | ICD-10-CM | POA: Diagnosis not present

## 2018-12-16 ENCOUNTER — Other Ambulatory Visit: Payer: Self-pay

## 2018-12-16 ENCOUNTER — Encounter (INDEPENDENT_AMBULATORY_CARE_PROVIDER_SITE_OTHER): Payer: Self-pay | Admitting: Family Medicine

## 2018-12-16 ENCOUNTER — Ambulatory Visit (INDEPENDENT_AMBULATORY_CARE_PROVIDER_SITE_OTHER): Payer: BC Managed Care – PPO | Admitting: Family Medicine

## 2018-12-16 VITALS — BP 122/84 | HR 104 | Temp 98.2°F | Ht 67.0 in | Wt 287.0 lb

## 2018-12-16 DIAGNOSIS — E8881 Metabolic syndrome: Secondary | ICD-10-CM

## 2018-12-16 DIAGNOSIS — E559 Vitamin D deficiency, unspecified: Secondary | ICD-10-CM | POA: Diagnosis not present

## 2018-12-16 DIAGNOSIS — Z9189 Other specified personal risk factors, not elsewhere classified: Secondary | ICD-10-CM

## 2018-12-16 DIAGNOSIS — E7849 Other hyperlipidemia: Secondary | ICD-10-CM

## 2018-12-16 DIAGNOSIS — Z6841 Body Mass Index (BMI) 40.0 and over, adult: Secondary | ICD-10-CM

## 2018-12-16 MED ORDER — VITAMIN D (ERGOCALCIFEROL) 1.25 MG (50000 UNIT) PO CAPS: 50000.0000 [IU] | ORAL_CAPSULE | ORAL | 0 refills | Status: DC

## 2018-12-17 NOTE — Progress Notes (Unsigned)
Office: [phone]  /  Fax: [phone]    Date: December 22, 2018   Appointment Start Time:*** Duration:*** Provider: Glennie Isle, Psy.D. Type of Session: Individual Therapy  Location of Patient: *** Location of Provider: {Location of Service:224[address]} Type of Contact: Telepsychological Visit via [employer] Web[employer]   Session Content: Traci [name] is a 38 y.o. female presenting via [place] for a follow-up appointment to address the previously established treatment goal of decreasing emotional eating. Today's appointment was a telepsychological visit, as this provider's clinic is seeing a limited number of patients for in-person visits due to COVID-19. Therapeutic services will resume to in-person appointments once deemed appropriate. Traci [name] expressed understanding regarding the rationale for telepsychological services, and provided verbal consent for today's appointment. Prior to proceeding with today's appointment, Traci [name]'s physical location at the time of this appointment was obtained. Traci [name] reported she was at [facility] and provided the address. In the event of technical difficulties, Traci [name] shared a phone number she could be reached at. Traci [name] and this provider participated in today's telepsychological service. Also, Traci [name] denied anyone else being present in the room or on the WebEx appointment ***.  This provider conducted a brief check-in and verbally administered the PHQ-9 and GAD-7. *** Traci [name] was receptive to today's session as evidenced by openness to sharing, responsiveness to feedback, and ***.  Mental Status Examination:  Appearance: {Appearance:22431} Behavior: {Behavior:22445} Mood: {Teletherapy mood:22435} Affect: {Affect:22436} Speech: {Speech:22432} Eye Contact: {Eye Contact:22433} Psychomotor Activity: {Motor Activity:22434} Thought Process: {thought process:22448}  Content/Perceptual Disturbances: {disturbances:22451} Orientation: {Orientation:22437}  Cognition/Sensorium: {gbcognition:22449} Insight: {Insight:22446} Judgment: {Insight:22446}  Structured Assessment Results: The Patient Health Questionnaire-9 (PHQ-9) is a self-report measure that assesses symptoms and severity of depression over the course of the last two weeks. Traci [name] obtained a score of *** suggesting {GBPHQ9SEVERITY:21752}. Traci [name] finds the endorsed symptoms to be {gbphq9difficulty:21754}. Traci [name] interest or pleasure in doing things ***  Feeling down, depressed, or hopeless ***  Trouble falling or staying asleep, or sleeping too much ***  Feeling tired or having Traci [name] energy ***  Poor appetite or overeating ***  Feeling bad about yourself --- or that you are a failure or have let yourself or your family down ***  Trouble concentrating on things, such as reading the newspaper or watching television ***  Moving or speaking so slowly that other people could have noticed? Or the opposite --- being so fidgety or restless that you have been moving around a lot more than usual ***  Thoughts that you would be better off dead or hurting yourself in some way ***  PHQ-9 Score ***    The Generalized Anxiety Disorder-7 (GAD-7) is a brief self-report measure that assesses symptoms of anxiety over the course of the last two weeks. Traci [name] obtained a score of *** suggesting {gbgad7severity:21753}. Traci [name] finds the endorsed symptoms to be {gbphq9difficulty:21754}. 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 Progress is limited, as Traci [name] has just begun treatment with this provider; however, she is receptive to the  interaction and interventions and rapport is being established.   Plan: Traci [name] continues to appear able and willing to participate as evidenced by engagement in reciprocal conversation, and asking questions for clarification as appropriate. The next appointment will be scheduled in {gbweeks:21758}, which will be via [employer]. The next session will focus on reviewing learned skills, and working towards the established treatment goal.***

## 2018-12-22 ENCOUNTER — Encounter: Payer: Self-pay | Admitting: Physician Assistant

## 2018-12-22 ENCOUNTER — Ambulatory Visit (INDEPENDENT_AMBULATORY_CARE_PROVIDER_SITE_OTHER): Payer: Self-pay | Admitting: Psychology

## 2018-12-22 ENCOUNTER — Encounter (HOSPITAL_BASED_OUTPATIENT_CLINIC_OR_DEPARTMENT_OTHER): Payer: BC Managed Care – PPO | Attending: Internal Medicine

## 2018-12-22 ENCOUNTER — Ambulatory Visit (INDEPENDENT_AMBULATORY_CARE_PROVIDER_SITE_OTHER): Payer: BC Managed Care – PPO | Admitting: Psychology

## 2018-12-22 ENCOUNTER — Ambulatory Visit (INDEPENDENT_AMBULATORY_CARE_PROVIDER_SITE_OTHER): Payer: BC Managed Care – PPO | Admitting: Physician Assistant

## 2018-12-22 ENCOUNTER — Encounter (HOSPITAL_BASED_OUTPATIENT_CLINIC_OR_DEPARTMENT_OTHER): Payer: Self-pay

## 2018-12-22 ENCOUNTER — Other Ambulatory Visit: Payer: Self-pay

## 2018-12-22 DIAGNOSIS — Z79899 Other long term (current) drug therapy: Secondary | ICD-10-CM | POA: Insufficient documentation

## 2018-12-22 DIAGNOSIS — F331 Major depressive disorder, recurrent, moderate: Secondary | ICD-10-CM | POA: Diagnosis not present

## 2018-12-22 DIAGNOSIS — Z9049 Acquired absence of other specified parts of digestive tract: Secondary | ICD-10-CM | POA: Insufficient documentation

## 2018-12-22 DIAGNOSIS — F411 Generalized anxiety disorder: Secondary | ICD-10-CM | POA: Diagnosis not present

## 2018-12-22 DIAGNOSIS — L89892 Pressure ulcer of other site, stage 2: Secondary | ICD-10-CM | POA: Insufficient documentation

## 2018-12-22 DIAGNOSIS — E538 Deficiency of other specified B group vitamins: Secondary | ICD-10-CM | POA: Diagnosis not present

## 2018-12-22 DIAGNOSIS — K50818 Crohn's disease of both small and large intestine with other complication: Secondary | ICD-10-CM | POA: Insufficient documentation

## 2018-12-22 DIAGNOSIS — G2581 Restless legs syndrome: Secondary | ICD-10-CM

## 2018-12-22 DIAGNOSIS — F3281 Premenstrual dysphoric disorder: Secondary | ICD-10-CM | POA: Diagnosis not present

## 2018-12-22 DIAGNOSIS — F330 Major depressive disorder, recurrent, mild: Secondary | ICD-10-CM | POA: Diagnosis not present

## 2018-12-22 DIAGNOSIS — E559 Vitamin D deficiency, unspecified: Secondary | ICD-10-CM | POA: Insufficient documentation

## 2018-12-22 DIAGNOSIS — K469 Unspecified abdominal hernia without obstruction or gangrene: Secondary | ICD-10-CM | POA: Insufficient documentation

## 2018-12-22 DIAGNOSIS — Z86718 Personal history of other venous thrombosis and embolism: Secondary | ICD-10-CM | POA: Diagnosis not present

## 2018-12-22 MED ORDER — LAMOTRIGINE 100 MG PO TABS: 150.0000 mg | ORAL_TABLET | Freq: Every day | ORAL | 0 refills | Status: DC

## 2018-12-22 NOTE — Progress Notes (Signed)
Office: [phone]  /  Fax: [phone]    Date: December 22, 2018    Appointment Start Time: 8:00am Duration: 30 minutes Provider: Glennie [name], Psy.D. Type of Session: Individual Therapy  Location of Patient: Home Location of Provider: [facility] Type of Contact: Telepsychological Visit via [employer]   Session Content: Traci [name] Traci [name] [name] is a 38 y.o. female presenting via [place] for a follow-up appointment to address the previously established treatment goal of decreasing emotional eating. Today's appointment was a telepsychological visit, as this provider's clinic is seeing a limited number of patients for in-person visits due to COVID-19. Therapeutic services will resume to in-person appointments once deemed appropriate. Traci [name] Traci [name] [name] expressed understanding regarding the rationale for telepsychological services, and provided verbal consent for today's appointment. Prior to proceeding with today's appointment, Traci [name] Traci [name] [name]'s physical location at the time of this appointment was obtained. Traci [name] Traci [name] [name] reported she was at home and provided the address. In the event of technical difficulties, Traci [name] Traci [name] [name] shared a phone number she could be reached at. Traci [name] Traci [name] [name] and this provider participated in today's telepsychological service. Also, Traci [name] Traci [name] [name] denied anyone else being present in the room Traci [name] on the WebEx appointment.  This provider conducted a brief check-in and verbally administered the PHQ-9 and GAD-7. Traci [name] Traci [name] [name] shared about her recent vacation and discussed some deviations form the structured meal plan. Notably, she shared she did "very well until the last three days." Traci [name] Traci [name] [name] indicated she is "overall Traci [name] blah" secondary to deviations from the structured meal plan. As such, psychoeducation regarding all Traci [name] nothing thinking was provided using a car accident metaphor. Traci [name] Traci [name] [name] shared, "I like that." Regarding emotional eating, Traci [name] Traci [name] [name] shared, "I think about it a Traci [name] more."  She also described reviewing the previously shared handout and described making better choices and engaging in portion control. This was positively reinforced. Psychoeducation regarding triggers for emotional eating was provided. Traci [name] Traci [name] [name] was provided a handout, and encouraged to utilize the handout between now and the next appointment to increase awareness of triggers and frequency. Traci [name] Traci [name] [name] agreed. This provider also discussed behavioral strategies for specific triggers, such as placing the utensil Traci [name] [name] when conversing to avoid mindless eating. Traci [name] Traci [name] [name] provided verbal consent during today's appointment for this provider to send the handout about triggers via e-mail. Overall, Traci [name] Traci [name] [name] was receptive to today's session as evidenced by openness to sharing, responsiveness to feedback, and willingness to explore triggers for emotional eating.  Mental Status Examination:  Appearance: neat Behavior: cooperative Mood: euthymic Affect: mood congruent Speech: normal in rate, volume, and tone Eye Contact: appropriate Psychomotor Activity: appropriate Thought Process: linear, logical, and goal directed  Content/Perceptual Disturbances: no hallucinations, delusions, bizarre thinking Traci [name] behavior reported Traci [name] observed and no evidence of suicidal and homicidal ideation, plan, and intent Orientation: time, person, place and purpose of appointment Cognition/Sensorium: memory, attention, language, and fund of knowledge intact  Insight: good Judgment: good  Structured Assessment Results: The Patient Health Questionnaire-9 (PHQ-9) is a self-report measure that assesses symptoms and severity of depression over the course of the last two weeks. Traci [name] Traci [name] [name] obtained a score of 2 suggesting minimal depression. Traci [name] Traci [name] [name] finds the endorsed symptoms to be not difficult at all. Traci [name] Traci [name] [name] 0  Traci [name] Traci [name] [name], Traci [name] Traci [name] [name], Traci [name] Traci [name] [name] 0  Traci [name] Traci [name] [name], Traci [name] Traci [name] [name] 0   Traci [name] Traci [name] [name] 0  Traci [name] appetite Traci [name] overeating 1  Traci [name] Traci [name] [name] --- Traci [name] that you are a failure Traci [name] have let Traci [name] [name] Traci [name] your family Traci [name] [name] 1  Traci [name] concentrating  on Traci [name] [name], such as reading the newspaper Traci [name] watching television 0  Moving Traci [name] speaking so slowly that other people could have noticed? Traci [name] the opposite --- being so fidgety Traci [name] restless that you have been moving around a lot more than usual 0  Thoughts that you would be better off dead Traci [name] hurting Traci [name] [name] in some way 0  PHQ-9 Score 2    The Generalized Anxiety Disorder-7 (GAD-7) is a brief self-report measure that assesses symptoms of anxiety over the course of the last two weeks. Traci [name] Traci [name] [name] obtained a score of 2 suggesting minimal anxiety. Traci [name] finds the endorsed symptoms to be not difficult at all. Traci [name] nervous, anxious, on edge 1  Not being able to stop Traci [name] control worrying 0  Worrying too Traci [name] [name] about different Traci [name] [name] 0  Traci [name] relaxing 0  Being so restless that it's hard to sit still 0  Becoming easily annoyed Traci [name] irritable 1  Traci [name] afraid as if something awful might happen 0  GAD-7 Score 2   Interventions:  Conducted a brief chart review Verbal administration of PHQ-9 and GAD-7 for symptom monitoring Provided empathic reflections and validation Reviewed content from the previous session Psychoeducation provided regarding triggers for emotional eating Provided positive reinforcement Focused on rapport building Psychoeducation provided regarding all-Traci [name]-nothing thinking Employed supportive psychotherapy interventions to facilitate reduced distress, and to improve coping skills with identified stressors  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 Progress is limited, as  Traci [name] [name] has just begun treatment with this provider; however, she is receptive to the interaction and interventions and rapport is being established.   Plan: Traci [name] continues to appear able and willing to participate as evidenced by engagement in reciprocal conversation, and asking questions for clarification as appropriate. The next appointment will be scheduled in 2-3 weeks, which will be via [employer]. The next session will focus on reviewing triggers for emotional eating and the introduction of mindfulness.

## 2018-12-22 NOTE — Progress Notes (Signed)
[facility]  Patient ID: Traci [name],  MRN: [id number]  PCP: Algis Greenhouse, MD  Date of Evaluation: 12/22/2018 Time spent:15 minutes  Chief Complaint:  Chief Complaint    Anxiety; Depression; Follow-up     Virtual Visit via Telephone Note  I connected with patient by a video enabled telemedicine application or telephone, with their informed consent, and verified patient privacy and that I am speaking with the correct person using two identifiers.  I am private, in my home and the patient is in her car.   I discussed the limitations, risks, security and privacy concerns of performing an evaluation and management service by telephone and the availability of in person appointments. I also discussed with the patient that there may be a patient responsible charge related to this service. The patient expressed understanding and agreed to proceed.   I discussed the assessment and treatment plan with the patient. The patient was provided an opportunity to ask questions and all were answered. The patient agreed with the plan and demonstrated an understanding of the instructions.   The patient was advised to call back or seek an in-person evaluation if the symptoms worsen or if the condition fails to improve as anticipated.  I provided 15 minutes of non-face-to-face time during this encounter.  HISTORY/CURRENT STATUS: HPI For routine f/u.  Doing well. "It's hard to know if the increased dose is working well or not, but I know it is not hurting.  I do not have any side effects.  And when I first started the Lamictal, I felt a lot better.  A lot has been going on since we increased the dose this last time.  I started doing a weight loss program so I am sure that affects my mental health.  I have also had a Remicade infusion in bed all my.  So that makes it hard to tell too.  But overall, I do feel better than I did several months back."  Still does not have a lot of energy but  does the things she has to do. Does not cry all the time.  Not isolating any more than is necessary due to the coronavirus pandemic.  Denies suicidal or homicidal thoughts.  She does enjoy things.  Patient denies increased energy with decreased need for sleep, no increased talkativeness, no racing thoughts, no impulsivity or risky behaviors, no increased spending, no increased libido, no grandiosity.  Anxiety is well controlled.  She is taking the Klonopin routinely now and I clarified that dose with her which is correct on the med sheet.  It is effective.  She bumped up the Zoloft before her menses this last time but not sure if it was helpful or not.  "It kind of snuck up on me so I do not know if there was enough time for the increased dose to work or not.  I had also had the infusion at the same time and that always makes me feel bad too.  So it is hard to know if it was helpful or not."  Denies dizziness, syncope, seizures, numbness, tingling, tremor, tics, unsteady gait, slurred speech, confusion. Denies muscle or joint pain, stiffness, or dystonia.  Individual Medical History/ Review of Systems: Changes? :Yes  is seeing Dr. Dennard Nip through [facility]  Past medications for mental health diagnoses include: Zoloft, Xanax, Wellbutrin, Lexapro, Effexor, Luvox, Cymbaltacaused more anxiety then depression  Allergies: Wellbutrin [bupropion] and Adhesive [tape]  Current Medications:  Current Outpatient Medications:  .  acetaminophen (TYLENOL) 325 MG tablet, Take 650 mg by mouth every 6 (six) hours as needed., Disp: , Rfl:  .  azaTHIOprine (IMURAN) 50 MG tablet, Take 125 mg by mouth daily., Disp: , Rfl:  .  clonazePAM (KLONOPIN) 0.5 MG tablet, TAKE 1/2 TO 1 TABLET BY MOUTH TWICE DAILY AS NEEDED (Patient taking differently: 1/2 po q am, 1/2 lunch, 1/2 afternoon, 1 po qhs.), Disp: 180 tablet, Rfl: 0 .  cyanocobalamin (,VITAMIN B-12,) 1000 MCG/ML injection, Inject 1,000 mcg into the  muscle every 30 (thirty) days., Disp: , Rfl:  .  ferrous sulfate 325 (65 FE) MG tablet, Take by mouth., Disp: , Rfl:  .  folic acid (FOLVITE) 1 MG tablet, Take 1 mg by mouth daily., Disp: , Rfl:  .  ibuprofen (ADVIL) 200 MG tablet, Take 200 mg by mouth every 6 (six) hours as needed., Disp: , Rfl:  .  inFLIXimab (REMICADE IV), Inject into the vein., Disp: , Rfl:  .  lamoTRIgine (LAMICTAL) 100 MG tablet, Take 1.5 tablets (150 mg total) by mouth daily., Disp: 135 tablet, Rfl: 0 .  loratadine (CLARITIN) 10 MG tablet, Take 10 mg by mouth daily., Disp: , Rfl:  .  Melatonin 10 MG TABS, Take 10 mg by mouth., Disp: , Rfl:  .  montelukast (SINGULAIR) 10 MG tablet, Take 10 mg by mouth at bedtime., Disp: , Rfl:  .  omeprazole (PRILOSEC) 20 MG capsule, Take 20 mg by mouth daily., Disp: , Rfl:  .  rOPINIRole (REQUIP) 4 MG tablet, Take 2 tablets (8 mg total) by mouth at bedtime., Disp: 180 tablet, Rfl: 1 .  sertraline (ZOLOFT) 100 MG tablet, Take 2 tablets (200 mg total) by mouth daily., Disp: 180 tablet, Rfl: 1 .  Vitamin D, Ergocalciferol, (DRISDOL) 1.25 MG (50000 UT) CAPS capsule, Take 1 capsule (50,000 Units total) by mouth every 7 (seven) days., Disp: 4 capsule, Rfl: 0 Medication Side Effects: none  Family Medical/ Social History: Changes? No  MENTAL HEALTH EXAM:  There were no vitals taken for this visit.There is no height or weight on file to calculate BMI.  General Appearance: unable to assess  Eye Contact:  unable to assess  Speech:  Clear and Coherent  Volume:  Normal  Mood:  Euthymic  Affect:  unable to assess  Thought Process:  Goal Directed  Orientation:  Full (Time, Place, and Person)  Thought Content: Logical   Suicidal Thoughts:  No  Homicidal Thoughts:  No  Memory:  WNL  Judgement:  Good  Insight:  Good  Psychomotor Activity:  unable to assess  Concentration:  Concentration: Good  Recall:  Good  Fund of Knowledge: Good  Language: Good  Assets:  Desire for Improvement  ADL's:   Intact  Cognition: WNL  Prognosis:  Good    DIAGNOSES:    ICD-10-CM   1. Major depressive disorder, recurrent episode, moderate (HCC)  F33.1   2. Generalized anxiety disorder  F41.1   3. Restless leg syndrome  G25.81   4. PMDD (premenstrual dysphoric disorder)  F32.81     Receiving Psychotherapy: Yes  she's seeing a psychologist through the weight loss Dr.  RECOMMENDATIONS:  We discussed increasing the Lamictal in hopes that it would be more helpful with depression symptoms.  She often experiences melancholy and I think the bump up of the dose would be beneficial.  She understands and agrees.  She knows to watch for rash and let me know immediately if that occurs. Increase to Lamictal 100 mg, 1.5  pills qd.  Continue Klonopin 0.40m, 1/2 at breakfast, lunch, and dinner, and 1 po qhs prn. We've discussed taking 1/2 extra during day prn.  It's okay.  Continue Ropinole 4 mg, 2 qhs Continue Zoloft 200 mg, 2 qd.  May increase to 2567m the week prior to menses.  Needs to decrease back to 200 mg once her menses begins. Return in 4 to 6 weeks.  TeDonnal MoatPA-C   This record has been created using [employer] Chart creation errors have been sought, but may not always have been located and corrected. Such creation errors do not reflect on the standard of medical care.

## 2018-12-24 ENCOUNTER — Encounter (INDEPENDENT_AMBULATORY_CARE_PROVIDER_SITE_OTHER): Payer: Self-pay | Admitting: Family Medicine

## 2018-12-25 NOTE — Telephone Encounter (Signed)
Please advise 

## 2018-12-25 NOTE — Progress Notes (Signed)
Office: [phone]  /  Fax: [phone]   HPI:   Chief Complaint: OBESITY Traci [name] is here to discuss her progress with her obesity treatment plan. She is on the Category 3 plan and is following her eating plan approximately 99% of the time. She states she is exercising 0 minutes 0 times per week. Traci [name] has done very well with weight loss on her eating plan. She sometimes struggles to eat all of her food, but hunger is well controlled.  Her weight is 287 lb (130.2 kg) today and has had a weight loss of 10 pounds over a period of 3 weeks since her last visit. She has lost 10 lbs since starting treatment with [place].  Hyperlipidemia Traci [name] has hyperlipidemia; her LDL is elevated at 146. She would like to work on improving with diet. She denies any chest pain.  Vitamin D deficiency Cortlyn has a new diagnosis of Vitamin D deficiency. She is currently not taking Vit D and denies nausea, vomiting or muscle weakness, but does admit to fatigue.  Insulin Resistance Traci [name] has a new diagnosis of insulin resistance based on her elevated fasting insulin level >5. A1c is well controlled at 5.1, but fasting insulin is elevated at 11.7. Although Traci [name]'s blood glucose readings are still under good control, insulin resistance puts her at greater risk of metabolic syndrome and diabetes. Polyphagia greatly improved on diet prescription.  At risk for diabetes Traci [name] is at higher than average risk for developing diabetes due to her obesity. She currently denies polyuria or polydipsia.  ASSESSMENT AND PLAN:  Vitamin D deficiency - Plan: Vitamin D, Ergocalciferol, (DRISDOL) 1.25 MG (50000 UT) CAPS capsule  Other hyperlipidemia  Insulin resistance  At risk for diabetes mellitus  Class 3 severe obesity with serious comorbidity and body mass index (BMI) of 45.0 to 49.9 in adult, unspecified obesity type ([place])  PLAN:  Hyperlipidemia Traci [name] was informed of the American Heart  Association Guidelines emphasizing intensive lifestyle modifications as the first line treatment for hyperlipidemia. We discussed many lifestyle modifications today in depth, and Traci [name] will continue to work on decreasing saturated fats such as fatty red meat, butter and many fried foods. Traci [name] will continue diet and exercise and will recheck in 3 weeks. She will also increase vegetables and lean protein in her diet and continue to work on exercise and weight loss efforts.  Vitamin D Deficiency Traci [name] was informed that low Vitamin D levels contributes to fatigue and are associated with obesity, breast, and colon cancer. She agrees to start prescription Vit D @ 50,000 IU every week #4 with 0 refills and will follow-up for routine testing of Vitamin D, at least 2-3 times per year. She was informed of the risk of over-replacement of Vitamin D and agrees to not increase her dose unless she discusses this with [place] first. Traci [name] agrees to follow-up with our clinic in 2-3 weeks.  Insulin Resistance Traci [name] will continue to work on weight loss, exercise, and decreasing simple carbohydrates in her diet to help decrease the risk of diabetes. We dicussed metformin including benefits and risks. She was informed that eating too many simple carbohydrates or too many calories at one sitting increases the likelihood of GI side effects. Traci [name] will continue diet and exercise and will follow-up in 2-3 weeks to monitor her progress.  Diabetes risk counseling Traci [name] was given extended (30 minutes) diabetes prevention counseling today. She is 38 y.o. female and has risk factors for diabetes including obesity. We discussed intensive lifestyle modifications today with  an emphasis on weight loss as well as increasing exercise and decreasing simple carbohydrates in her diet.  Obesity Traci [name] is currently in the action stage of change. As such, her goal is to continue with weight loss efforts. She has  agreed to follow the Category 3 plan. Lean meat equivalents were discussed. Traci [name] has been instructed to work up to a goal of 150 minutes of combined cardio and strengthening exercise per week for weight loss and overall health benefits. We discussed the following Behavioral Modification Strategies today: increasing lean protein intake and decreasing simple carbohydrates.   Traci [name] has agreed to follow-up with our clinic in 2-3 weeks. She was informed of the importance of frequent follow-up visits to maximize her success with intensive lifestyle modifications for her multiple health conditions.  ALLERGIES: Allergies  Allergen Reactions  . Wellbutrin [Bupropion] Anxiety  . Adhesive [Tape]     MEDICATIONS: Current Outpatient Medications on File Prior to Visit  Medication Sig Dispense Refill  . acetaminophen (TYLENOL) 325 MG tablet Take 650 mg by mouth every 6 (six) hours as needed.    Marland Kitchen azaTHIOprine (IMURAN) 50 MG tablet Take 125 mg by mouth daily.    . clonazePAM (KLONOPIN) 0.5 MG tablet TAKE 1/2 TO 1 TABLET BY MOUTH TWICE DAILY AS NEEDED (Patient taking differently: 1/2 po q am, 1/2 lunch, 1/2 afternoon, 1 po qhs.) 180 tablet 0  . cyanocobalamin (,VITAMIN B-12,) 1000 MCG/ML injection Inject 1,000 mcg into the muscle every 30 (thirty) days.    . ferrous sulfate 325 (65 FE) MG tablet Take by mouth.    . folic acid (FOLVITE) 1 MG tablet Take 1 mg by mouth daily.    Marland Kitchen ibuprofen (ADVIL) 200 MG tablet Take 200 mg by mouth every 6 (six) hours as needed.    . inFLIXimab (REMICADE IV) Inject into the vein.    Marland Kitchen loratadine (CLARITIN) 10 MG tablet Take 10 mg by mouth daily.    . Melatonin 10 MG TABS Take 10 mg by mouth.    . montelukast (SINGULAIR) 10 MG tablet Take 10 mg by mouth at bedtime.    Marland Kitchen omeprazole (PRILOSEC) 20 MG capsule Take 20 mg by mouth daily.    Marland Kitchen rOPINIRole (REQUIP) 4 MG tablet Take 2 tablets (8 mg total) by mouth at bedtime. 180 tablet 1  . sertraline (ZOLOFT) 100 MG  tablet Take 2 tablets (200 mg total) by mouth daily. 180 tablet 1   No current facility-administered medications on file prior to visit.     PAST MEDICAL HISTORY: Past Medical History:  Diagnosis Date  . Anemia   . Anxiety   . Blood clot in vein    Right lower leg after surgery  . Crohn's disease ([place])   . Depression   . Dyspnea   . Elevated BP without diagnosis of hypertension   . Enlarged liver   . GERD (gastroesophageal reflux disease)   . IBS (irritable bowel syndrome)   . Laceration of left little finger 02/28/2018  . Lower extremity edema   . Restless legs   . Seasonal allergies   . Uterine fibroid   . Vitamin B 12 deficiency   . Vitamin D deficiency     PAST SURGICAL HISTORY: Past Surgical History:  Procedure Laterality Date  . BOWEL RESECTION    . BREAST BIOPSY    . NASAL SINUS SURGERY      SOCIAL HISTORY: Social History   Tobacco Use  . Smoking status: Never Smoker  . Smokeless tobacco:  Never Used  Substance Use Topics  . Alcohol use: Never    Frequency: Never  . Drug use: Never    FAMILY HISTORY: Family History  Problem Relation Age of Onset  . Heart failure Other   . Leukemia Paternal Grandmother   . Hyperlipidemia Mother   . Thyroid disease Mother   . Anxiety disorder Mother   . Depression Mother    ROS: Review of Systems  Constitutional: Positive for malaise/fatigue.  Cardiovascular: Negative for chest pain.  Gastrointestinal: Negative for nausea and vomiting.  Musculoskeletal:       Negative for muscle weakness.   PHYSICAL EXAM: Blood pressure 122/84, pulse (!) 104, temperature 98.2 F (36.8 C), temperature source Oral, height 5' 7"  (1.702 m), weight 287 lb (130.2 kg), last menstrual period 11/20/2018, SpO2 98 %. Body mass index is 44.95 kg/m. Physical Exam Vitals signs reviewed.  Constitutional:      Appearance: Normal appearance. She is obese.  Cardiovascular:     Rate and Rhythm: Normal rate.     Pulses: Normal pulses.   Pulmonary:     Effort: Pulmonary effort is normal.     Breath sounds: Normal breath sounds.  Musculoskeletal: Normal range of motion.  Skin:    General: Skin is warm and dry.  Neurological:     Mental Status: She is alert and oriented to person, place, and time.  Psychiatric:        Behavior: Behavior normal.   RECENT LABS AND TESTS: BMET    Component Value Date/Time   NA 141 12/02/2018 1355   K 4.4 12/02/2018 1355   CL 103 12/02/2018 1355   CO2 22 12/02/2018 1355   GLUCOSE 91 12/02/2018 1355   BUN 15 12/02/2018 1355   CREATININE 0.74 12/02/2018 1355   CALCIUM 9.3 12/02/2018 1355   GFRNONAA 105 12/02/2018 1355   GFRAA 121 12/02/2018 1355   Lab Results  Component Value Date   HGBA1C 5.1 12/02/2018   Lab Results  Component Value Date   INSULIN 11.7 12/02/2018   CBC    Component Value Date/Time   WBC 6.7 12/02/2018 1355   RBC 4.88 12/02/2018 1355   HGB 13.6 12/02/2018 1355   HCT 42.8 12/02/2018 1355   MCV 88 12/02/2018 1355   MCH 27.9 12/02/2018 1355   MCHC 31.8 12/02/2018 1355   RDW 15.4 12/02/2018 1355   LYMPHSABS 1.5 12/02/2018 1355   EOSABS 0.2 12/02/2018 1355   BASOSABS 0.1 12/02/2018 1355   Iron/TIBC/Ferritin/ %Sat No results found for: IRON, TIBC, FERRITIN, IRONPCTSAT Lipid Panel     Component Value Date/Time   CHOL 232 (H) 12/02/2018 1355   TRIG 132 12/02/2018 1355   HDL 60 12/02/2018 1355   LDLCALC 146 (H) 12/02/2018 1355   Hepatic Function Panel     Component Value Date/Time   PROT 6.5 12/02/2018 1355   ALBUMIN 4.2 12/02/2018 1355   AST 27 12/02/2018 1355   ALT 14 12/02/2018 1355   ALKPHOS 101 12/02/2018 1355   BILITOT 0.3 12/02/2018 1355      Component Value Date/Time   TSH 1.790 12/02/2018 1355   Results for MARNITA, POIRIER (MRN [id number]) as of 12/25/2018 07:15  Ref. Range 12/02/2018 13:55  Vitamin D, 25-Hydroxy Latest Ref Range: 30.0 - 100.0 ng/mL 11.4 (L)   OBESITY BEHAVIORAL INTERVENTION VISIT  Today's visit was #2   Starting weight: 297 lbs Starting date: 12/02/2018 Today's weight: 287 lbs  Today's date: 12/16/2018 Total lbs lost to date: 10    12/16/2018  Height 5' 7"  (1.702 m)  Weight 287 lb (130.2 kg)  BMI (Calculated) 44.94  BLOOD PRESSURE - SYSTOLIC 483  BLOOD PRESSURE - DIASTOLIC 84   Body Fat % 07.3 %  Total Body Water (lbs) 104.8 lbs   ASK: We discussed the diagnosis of obesity with Kathrynn Humble today and Irianna agreed to give [place] permission to discuss obesity behavioral modification therapy today.  ASSESS: Aizlyn has the diagnosis of obesity and her BMI today is 45.1. Aylen is in the action stage of change.  ADVISE: Zemira was educated on the multiple health risks of obesity as well as the benefit of weight loss to improve her health. She was advised of the need for long term treatment and the importance of lifestyle modifications to improve her current health and to decrease her risk of future health problems.  AGREE: Multiple dietary modification options and treatment options were discussed and  Nolyn agreed to follow the recommendations documented in the above note.  ARRANGE: Nuria was educated on the importance of frequent visits to treat obesity as outlined per CMS and USPSTF guidelines and agreed to schedule her next follow up appointment today.  I, Michaelene Song, am acting as Location manager for Dennard Nip, MD I have reviewed the above documentation for accuracy and completeness, and I agree with the above. -Dennard Nip, MD

## 2018-12-30 ENCOUNTER — Ambulatory Visit (INDEPENDENT_AMBULATORY_CARE_PROVIDER_SITE_OTHER): Payer: BC Managed Care – PPO | Admitting: Family Medicine

## 2018-12-30 ENCOUNTER — Encounter (INDEPENDENT_AMBULATORY_CARE_PROVIDER_SITE_OTHER): Payer: Self-pay | Admitting: Family Medicine

## 2018-12-30 ENCOUNTER — Other Ambulatory Visit: Payer: Self-pay

## 2018-12-30 VITALS — BP 114/78 | HR 95 | Temp 100.0°F | Ht 67.0 in | Wt 280.0 lb

## 2018-12-30 DIAGNOSIS — E559 Vitamin D deficiency, unspecified: Secondary | ICD-10-CM | POA: Diagnosis not present

## 2018-12-30 DIAGNOSIS — Z6841 Body Mass Index (BMI) 40.0 and over, adult: Secondary | ICD-10-CM | POA: Diagnosis not present

## 2018-12-30 DIAGNOSIS — Z9189 Other specified personal risk factors, not elsewhere classified: Secondary | ICD-10-CM | POA: Diagnosis not present

## 2018-12-30 MED ORDER — VITAMIN D (ERGOCALCIFEROL) 1.25 MG (50000 UNIT) PO CAPS: 50000.0000 [IU] | ORAL_CAPSULE | ORAL | 0 refills | Status: DC

## 2018-12-31 NOTE — Progress Notes (Addendum)
Office: [phone]  /  Fax: [phone]    Date: January 08, 2019   Appointment Start Time: 8:29am Duration: 32 minutes Provider: Glennie Isle, Psy.D. Type of Session: Individual Therapy  Location of Patient: Home Location of Provider: [facility] Type of Contact: Telepsychological Visit via [employer] Web[employer]   Session Content: Traci [name] is a 38 y.o. female presenting via [place] for a follow-up appointment to address the previously established treatment goal of decreasing emotional eating. Today's appointment was a telepsychological visit, as this provider's clinic is seeing a limited number of patients for in-person visits due to COVID-19. Therapeutic services will resume to in-person appointments once deemed appropriate. Traci [name] expressed understanding regarding the rationale for telepsychological services, and provided verbal consent for today's appointment. Prior to proceeding with today's appointment, Traci [name]'s physical location at the time of this appointment was obtained. Traci [name] reported she was at home and provided the address. In the event of technical difficulties, Traci [name] shared a phone number she could be reached at. Traci [name] and this provider participated in today's telepsychological service. Also, Traci [name] denied anyone else being present in the room or on the WebEx appointment.  This provider conducted a brief check-in and verbally administered the PHQ-9 and GAD-7. Traci [name] reported, "I lost several more pounds." She indicated she has "not been doing that great this week" as it relates to eating, which she attributed to mensturation. This was further explored. Traci [name] stated she ate from a [employer] on Monday and she recalled consuming grilled chicken, grilled vegetables, rice and beans, and cheese sauce. This provider explored what she would have consumed prior to starting with the clinic. It was reflected that Traci [name] is making  better choices. Additionally, psychoeducation regarding all or nothing thinking was provided using metaphors. She reflected, "I'm either completely on the wagon or completely off the wagon." Traci [name] acknowledged that after eating [place] food she ate more of her snack calories than allotted in her meal plan the following day and noted she felt more hungry overall. It was identified she was not consuming enough protein when deviating from the meal plan. Thus, this provider discussed the importance of protein intake as it relates to feeling full longer. When she picked up the [place] food, she acknowledged she did not engage in the discussed behavioral strategies (e.g., placing the utensil down when conversing to avoid mindless eating and packing additional food prior to starting an entree to assist with portion control). The strategies were reviewed and she was encouraged to engage in them in the future. This provider also discussed planning ahead for times when she may be going out to eat to avoid going over her calorie intake. In addition, this provider discussed looking at the menu ahead of time online when possible and subsequently consuming some protein ahead of time if needed. It was also recommended Traci [name] discuss other options with Traci [name] for when going on vacation, as Traci [name] stated she will be going to the beach for her birthday. She agreed. Traci [name] was receptive to today's session as evidenced by openness to sharing, responsiveness to feedback, and willingness to engage in discussed strategies.  Mental Status Examination:  Appearance: neat Behavior: cooperative Mood: euthymic Affect: mood congruent Speech: normal in rate, volume, and tone Eye Contact: appropriate Psychomotor Activity: appropriate Thought Process: linear, logical, and goal directed  Content/Perceptual Disturbances: no hallucinations, delusions, bizarre thinking or behavior reported or observed and no evidence of  suicidal and homicidal ideation, plan, and intent Orientation: time, person, place and  purpose of appointment Cognition/Sensorium: memory, attention, language, and fund of knowledge intact  Insight: good Judgment: good  Structured Assessment Results: The Patient Health Questionnaire-9 (PHQ-9) is a self-report measure that assesses symptoms and severity of depression over the course of the last two weeks. Traci [name] obtained a score of 1 suggesting minimal depression. Traci [name] finds the endorsed symptoms to be not difficult at all. Little interest or pleasure in doing things 0  Feeling down, depressed, or hopeless 0  Trouble falling or staying asleep, or sleeping too much 0  Feeling tired or having little energy 0  Poor appetite or overeating 0  Feeling bad about yourself --- or that you are a failure or have let yourself or your family down 1  Trouble concentrating on things, such as reading the newspaper or watching television 0  Moving or speaking so slowly that other people could have noticed? Or the opposite --- being so fidgety or restless that you have been moving around a lot more than usual 0  Thoughts that you would be better off dead or hurting yourself in some way 0  PHQ-9 Score 1    The Generalized Anxiety Disorder-7 (GAD-7) is a brief self-report measure that assesses symptoms of anxiety over the course of the last two weeks. Traci [name] obtained a score of 6 suggesting mild anxiety. Traci [name] finds the endorsed symptoms to be not difficult at all. Feeling nervous, anxious, on edge 3  Not being able to stop or control worrying 0  Worrying too much about different things 0  Trouble relaxing 1  Being so restless that it's hard to sit still 1  Becoming easily annoyed or irritable 1  Feeling afraid as if something awful might happen 0  GAD-7 Score 6   Interventions:  Conducted a brief chart review Verbal administration of PHQ-9 and GAD-7 for symptom monitoring Provided empathic  reflections and validation Psychoeducation provided regarding all-or-nothing thinking Employed supportive psychotherapy interventions to facilitate reduced distress, and to improve coping skills with identified stressors Discussed behavioral strategies Psychoeducation provided regarding protein intake  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 Traci [name] has demonstrated progress in her goal as evidenced by increased awareness of hunger patterns and triggers for emotional eating.  Plan: Traci [name] continues to appear able and willing to participate as evidenced by engagement in reciprocal conversation, and asking questions for clarification as appropriate. Based on availability, Traci [name] was offered an appointment in one week as well as in three weeks; however, she requested to be scheduled for an appointment closer to four weeks based on her work schedule and upcoming vacation. She acknowledged understanding that she may call this provider's office and request an appointment sooner if available. The next appointment will be via [employer]. The next session will focus on the introduction of mindfulness, as it was not introduced today based on Traci [name]'s presenting concerns.

## 2018-12-31 NOTE — Progress Notes (Signed)
Office: [phone]  /  Fax: [phone]   HPI:   Chief Complaint: OBESITY Traci [name] is here to discuss her progress with her obesity treatment plan. She is on the Category 3 plan and is following her eating plan approximately 85 % of the time. She states she is exercising 0 minutes 0 times per week. Traci [name] continues to do well with weight loss, but she is starting to get bored with her plan, especially with breakfast. Hunger is mostly controlled. She feels she still needs the structure. Her weight is 280 lb (127 kg) today and has had a weight loss of 7 pounds over a period of 2 weeks since her last visit. She has lost 17 lbs since starting treatment with [place].  Vitamin D deficiency Traci [name] has a diagnosis of vitamin D deficiency. Traci [name] is stable on vit D, but she is not yet at goal. Traci [name] denies nausea, vomiting or muscle weakness.  At risk for osteopenia and osteoporosis Traci [name] is at higher risk of osteopenia and osteoporosis due to vitamin D deficiency.   ASSESSMENT AND PLAN:  Vitamin D deficiency - Plan: Vitamin D, Ergocalciferol, (DRISDOL) 1.25 MG (50000 UT) CAPS capsule  At risk for osteoporosis  Class 3 severe obesity with serious comorbidity and body mass index (BMI) of 40.0 to 44.9 in adult, unspecified obesity type (Traci [name])  PLAN:  Vitamin D Deficiency Traci [name] was informed that low vitamin D levels contributes to fatigue and are associated with obesity, breast, and colon cancer. She agrees to continue to take prescription Vit D @50 ,000 IU every week #4 with no refills and will follow up for routine testing of vitamin D, at least 2-3 times per year. She was informed of the risk of over-replacement of vitamin D and agrees to not increase her dose unless she discusses this with [place] first. Traci [name] agrees to follow up with our clinic in 2 weeks.  At risk for osteopenia and osteoporosis Traci [name] was given extended  (15 minutes) osteoporosis prevention counseling  today. Traci [name] is at risk for osteopenia and osteoporosis due to her vitamin D deficiency. She was encouraged to take her vitamin D and follow her higher calcium diet and increase strengthening exercise to help strengthen her bones and decrease her risk of osteopenia and osteoporosis.  Obesity Traci [name] is currently in the action stage of change. As such, her goal is to continue with weight loss efforts She has agreed to follow the Category 3 plan with breakfast options Traci [name] has been instructed to work up to a goal of 150 minutes of combined cardio and strengthening exercise per week for weight loss and overall health benefits. We discussed the following Behavioral Modification Strategies today: increasing lean protein intake and decreasing simple carbohydrates   Traci [name] has agreed to follow up with our clinic in 2 weeks. She was informed of the importance of frequent follow up visits to maximize her success with intensive lifestyle modifications for her multiple health conditions.  ALLERGIES: Allergies  Allergen Reactions   Wellbutrin [Bupropion] Anxiety   Adhesive [Tape]     MEDICATIONS: Current Outpatient Medications on File Prior to Visit  Medication Sig Dispense Refill   acetaminophen (TYLENOL) 325 MG tablet Take 650 mg by mouth every 6 (six) hours as needed.     azaTHIOprine (IMURAN) 50 MG tablet Take 125 mg by mouth daily.     clonazePAM (KLONOPIN) 0.5 MG tablet TAKE 1/2 TO 1 TABLET BY MOUTH TWICE DAILY AS NEEDED (Patient taking differently: 1/2 po q am, 1/2 lunch, 1/2  afternoon, 1 po qhs.) 180 tablet 0   cyanocobalamin (,VITAMIN B-12,) 1000 MCG/ML injection Inject 1,000 mcg into the muscle every 30 (thirty) days.     ferrous sulfate 325 (65 FE) MG tablet Take by mouth.     folic acid (FOLVITE) 1 MG tablet Take 1 mg by mouth daily.     ibuprofen (ADVIL) 200 MG tablet Take 200 mg by mouth every 6 (six) hours as needed.     inFLIXimab (REMICADE IV) Inject into the  vein.     lamoTRIgine (LAMICTAL) 100 MG tablet Take 1.5 tablets (150 mg total) by mouth daily. 135 tablet 0   loratadine (CLARITIN) 10 MG tablet Take 10 mg by mouth daily.     Melatonin 10 MG TABS Take 10 mg by mouth.     montelukast (SINGULAIR) 10 MG tablet Take 10 mg by mouth at bedtime.     omeprazole (PRILOSEC) 20 MG capsule Take 20 mg by mouth daily.     rOPINIRole (REQUIP) 4 MG tablet Take 2 tablets (8 mg total) by mouth at bedtime. 180 tablet 1   sertraline (ZOLOFT) 100 MG tablet Take 2 tablets (200 mg total) by mouth daily. 180 tablet 1   No current facility-administered medications on file prior to visit.     PAST MEDICAL HISTORY: Past Medical History:  Diagnosis Date   Anemia    Anxiety    Blood clot in vein    Right lower leg after surgery   Crohn's disease (HCC)    Depression    Dyspnea    Elevated BP without diagnosis of hypertension    Enlarged liver    GERD (gastroesophageal reflux disease)    IBS (irritable bowel syndrome)    Laceration of left little finger 02/28/2018   Lower extremity edema    Restless legs    Seasonal allergies    Uterine fibroid    Vitamin B 12 deficiency    Vitamin D deficiency     PAST SURGICAL HISTORY: Past Surgical History:  Procedure Laterality Date   BOWEL RESECTION     BREAST BIOPSY     NASAL SINUS SURGERY      SOCIAL HISTORY: Social History   Tobacco Use   Smoking status: Never Smoker   Smokeless tobacco: Never Used  Substance Use Topics   Alcohol use: Never    Frequency: Never   Drug use: Never    FAMILY HISTORY: Family History  Problem Relation Age of Onset   Heart failure Other    Leukemia Paternal Grandmother    Hyperlipidemia Mother    Thyroid disease Mother    Anxiety disorder Mother    Depression Mother     ROS: Review of Systems  Constitutional: Positive for weight loss.  Gastrointestinal: Negative for nausea and vomiting.  Musculoskeletal:       Negative  for muscle weakness    PHYSICAL EXAM: Blood pressure 114/78, pulse 95, temperature 100 F (37.8 C), temperature source Oral, height 5' 7"  (1.702 m), weight 280 lb (127 kg), SpO2 98 %. Body mass index is 43.85 kg/m. Physical Exam Vitals signs reviewed.  Constitutional:      Appearance: Normal appearance. She is well-developed. She is obese.  Cardiovascular:     Rate and Rhythm: Normal rate.  Pulmonary:     Effort: Pulmonary effort is normal.  Musculoskeletal: Normal range of motion.  Skin:    General: Skin is warm and dry.  Neurological:     Mental Status: She is alert and oriented  to person, place, and time.  Psychiatric:        Mood and Affect: Mood normal.        Behavior: Behavior normal.     RECENT LABS AND TESTS: BMET    Component Value Date/Time   NA 141 12/02/2018 1355   K 4.4 12/02/2018 1355   CL 103 12/02/2018 1355   CO2 22 12/02/2018 1355   GLUCOSE 91 12/02/2018 1355   BUN 15 12/02/2018 1355   CREATININE 0.74 12/02/2018 1355   CALCIUM 9.3 12/02/2018 1355   GFRNONAA 105 12/02/2018 1355   GFRAA 121 12/02/2018 1355   Lab Results  Component Value Date   HGBA1C 5.1 12/02/2018   Lab Results  Component Value Date   INSULIN 11.7 12/02/2018   CBC    Component Value Date/Time   WBC 6.7 12/02/2018 1355   RBC 4.88 12/02/2018 1355   HGB 13.6 12/02/2018 1355   HCT 42.8 12/02/2018 1355   MCV 88 12/02/2018 1355   MCH 27.9 12/02/2018 1355   MCHC 31.8 12/02/2018 1355   RDW 15.4 12/02/2018 1355   LYMPHSABS 1.5 12/02/2018 1355   EOSABS 0.2 12/02/2018 1355   BASOSABS 0.1 12/02/2018 1355   Iron/TIBC/Ferritin/ %Sat No results found for: IRON, TIBC, FERRITIN, IRONPCTSAT Lipid Panel     Component Value Date/Time   CHOL 232 (H) 12/02/2018 1355   TRIG 132 12/02/2018 1355   HDL 60 12/02/2018 1355   LDLCALC 146 (H) 12/02/2018 1355   Hepatic Function Panel     Component Value Date/Time   PROT 6.5 12/02/2018 1355   ALBUMIN 4.2 12/02/2018 1355   AST 27  12/02/2018 1355   ALT 14 12/02/2018 1355   ALKPHOS 101 12/02/2018 1355   BILITOT 0.3 12/02/2018 1355      Component Value Date/Time   TSH 1.790 12/02/2018 1355     Ref. Range 12/02/2018 13:55  Vitamin D, 25-Hydroxy Latest Ref Range: 30.0 - 100.0 ng/mL 11.4 (L)   OBESITY BEHAVIORAL INTERVENTION VISIT  Today's visit was # 3   Starting weight: 297 lbs Starting date: 12/02/2018 Today's weight : 280 lbs Today's date: 12/30/2018 Total lbs lost to date: 17    12/30/2018  Height 5' 7"  (1.702 m)  Weight 280 lb (127 kg)  BMI (Calculated) 43.84  BLOOD PRESSURE - SYSTOLIC 295  BLOOD PRESSURE - DIASTOLIC 78   Body Fat % 28.4 %  Total Body Water (lbs) 101.2 lbs    ASK: We discussed the diagnosis of obesity with Traci [name] today and Traci [name] agreed to give [place] permission to discuss obesity behavioral modification therapy today.  ASSESS: Andrianna has the diagnosis of obesity and her BMI today is 43.84 Traci [name] is in the action stage of change   ADVISE: Traci [name] was educated on the multiple health risks of obesity as well as the benefit of weight loss to improve her health. She was advised of the need for long term treatment and the importance of lifestyle modifications to improve her current health and to decrease her risk of future health problems.  AGREE: Multiple dietary modification options and treatment options were discussed and  Traci [name] agreed to follow the recommendations documented in the above note.  ARRANGE: Traci [name] was educated on the importance of frequent visits to treat obesity as outlined per CMS and USPSTF guidelines and agreed to schedule her next follow up appointment today.  I, Doreene Nest, am acting as transcriptionist for Dennard Nip, MD I have reviewed the above documentation for accuracy and completeness, and  I agree with the above. -Dennard Nip, MD

## 2019-01-05 DIAGNOSIS — L89892 Pressure ulcer of other site, stage 2: Secondary | ICD-10-CM | POA: Diagnosis not present

## 2019-01-08 ENCOUNTER — Ambulatory Visit (INDEPENDENT_AMBULATORY_CARE_PROVIDER_SITE_OTHER): Payer: BC Managed Care – PPO | Admitting: Psychology

## 2019-01-08 ENCOUNTER — Other Ambulatory Visit: Payer: Self-pay

## 2019-01-08 DIAGNOSIS — F330 Major depressive disorder, recurrent, mild: Secondary | ICD-10-CM | POA: Diagnosis not present

## 2019-01-20 ENCOUNTER — Ambulatory Visit (INDEPENDENT_AMBULATORY_CARE_PROVIDER_SITE_OTHER): Payer: BC Managed Care – PPO | Admitting: Family Medicine

## 2019-01-21 NOTE — Progress Notes (Unsigned)
Office: [phone]  /  Fax: [phone]    Date: February 04, 2019   Appointment Start Time:*** Duration:*** Provider: Glennie Isle, Psy.D. Type of Session: Individual Therapy  Location of Patient: *** Location of Provider: {Location of Service:22491} Type of Contact: Telepsychological Visit via [employer] Web[employer]   Session Content: Traci [name] is a 38 y.o. female presenting via [place] for a follow-up appointment to address the previously established treatment goal of decreasing emotional eating. Today's appointment was a telepsychological visit, as this provider's clinic is seeing a limited number of patients for in-person visits due to COVID-19. Therapeutic services will resume to in-person appointments once deemed appropriate. Noreta expressed understanding regarding the rationale for telepsychological services, and provided verbal consent for today's appointment. Prior to proceeding with today's appointment, Ar[place]ia's physical location at the time of this appointment was obtained. Gianny reported she was at [facility] and provided the address. In the event of technical difficulties, Lacoya shared a phone number she could be reached at. Benjamine Mola and this provider participated in today's telepsychological service. Also, Voncile denied anyone else being present in the room or on the WebEx appointment ***.  This provider conducted a brief check-in and verbally administered the PHQ-9 and GAD-7. *** Envi was receptive to today's session as evidenced by openness to sharing, responsiveness to feedback, and ***.  Mental Status Examination:  Appearance: {Appearance:22431} Behavior: {Behavior:22445} Mood: {gbmood:21757} Affect: {Affect:22436} Speech: {Speech:22432} Eye Contact: {Eye Contact:22433} Psychomotor Activity: {Motor Activity:22434} Thought Process: {thought process:22448}  Content/Perceptual Disturbances: {disturbances:22451} Orientation: {Orientation:22437}  Cognition/Sensorium: {gbcognition:22449} Insight: {Insight:22446} Judgment: {Insight:22446}  Structured Assessment Results: The Patient Health Questionnaire-9 (PHQ-9) is a self-report measure that assesses symptoms and severity of depression over the course of the last two weeks. Josslin obtained a score of *** suggesting {GBPHQ9SEVERITY:21752}. Inetha finds the endorsed symptoms to be {gbphq9difficulty:21754}. Little interest or pleasure in doing things ***  Feeling down, depressed, or hopeless ***  Trouble falling or staying asleep, or sleeping too much ***  Feeling tired or having little energy ***  Poor appetite or overeating ***  Feeling bad about yourself --- or that you are a failure or have let yourself or your family down ***  Trouble concentrating on things, such as reading the newspaper or watching television ***  Moving or speaking so slowly that other people could have noticed? Or the opposite --- being so fidgety or restless that you have been moving around a lot more than usual ***  Thoughts that you would be better off dead or hurting yourself in some way ***  PHQ-9 Score ***    The Generalized Anxiety Disorder-7 (GAD-7) is a brief self-report measure that assesses symptoms of anxiety over the course of the last two weeks. Alishia obtained a score of *** suggesting {gbgad7severity:21753}. Parish finds the endorsed symptoms to be {gbphq9difficulty:21754}. 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 Emonni has demonstrated progress in her goal as evidenced by {gbtxprogress:22839}. Khala also reported {gbtxprogress2:22951}.  Plan: Prisca continues  to appear able and willing to participate as evidenced by engagement in reciprocal conversation, and asking questions for clarification as appropriate. The next appointment will be scheduled in {gbweeks:21758}, which will be via [employer]. The next session will focus on reviewing learned skills, and working towards the established treatment goal.***

## 2019-01-22 ENCOUNTER — Encounter (INDEPENDENT_AMBULATORY_CARE_PROVIDER_SITE_OTHER): Payer: Self-pay | Admitting: Family Medicine

## 2019-01-22 ENCOUNTER — Other Ambulatory Visit: Payer: Self-pay

## 2019-01-22 ENCOUNTER — Ambulatory Visit (INDEPENDENT_AMBULATORY_CARE_PROVIDER_SITE_OTHER): Payer: BC Managed Care – PPO | Admitting: Family Medicine

## 2019-01-22 VITALS — BP 128/79 | HR 104 | Temp 98.1°F | Ht 67.0 in | Wt 275.0 lb

## 2019-01-22 DIAGNOSIS — E8881 Metabolic syndrome: Secondary | ICD-10-CM | POA: Diagnosis not present

## 2019-01-22 DIAGNOSIS — Z9189 Other specified personal risk factors, not elsewhere classified: Secondary | ICD-10-CM

## 2019-01-22 DIAGNOSIS — Z6841 Body Mass Index (BMI) 40.0 and over, adult: Secondary | ICD-10-CM

## 2019-01-22 DIAGNOSIS — E559 Vitamin D deficiency, unspecified: Secondary | ICD-10-CM | POA: Diagnosis not present

## 2019-01-22 MED ORDER — METFORMIN HCL 500 MG PO TABS: 500.0000 mg | ORAL_TABLET | Freq: Every day | ORAL | 0 refills | Status: DC

## 2019-01-22 MED ORDER — VITAMIN D (ERGOCALCIFEROL) 1.25 MG (50000 UNIT) PO CAPS: 50000.0000 [IU] | ORAL_CAPSULE | ORAL | 0 refills | Status: DC

## 2019-01-26 NOTE — Progress Notes (Signed)
Office: [phone]  /  Fax: [phone]   HPI:   Chief Complaint: OBESITY Traci [name] is here to discuss her progress with her obesity treatment plan. She is on the  follow the Category 3 plan and is following her eating plan approximately 70 % of the time. She states she is exercising 0 minutes 0 times per week. Traci [name] continues to do well with weight loss on her Category 3 plan. Hunger is controlled but she is struggled with meal planning especially for dinner.  Her weight is 275 lb (124.7 kg) today and has had a weight loss of 5 pounds over a period of 3 weeks since her last visit. She has lost 22 lbs since starting treatment with [place].  Vitamin D deficiency Traci [name] has a diagnosis of vitamin D deficiency. She is currently taking vit D and denies nausea, vomiting or muscle weakness.  Insulin Resistance Traci [name] has a diagnosis of insulin resistance based on her elevated fasting insulin level >5. Although Traci [name]'s blood glucose readings are still under good control, insulin resistance puts her at greater risk of metabolic syndrome and diabetes. She is not taking metformin currently and continues to work on diet and exercise to decrease risk of diabetes. She notes increased polyphagia in the last 2 weeks, more frequently when she deviates from the plan.   At risk for diabetes Traci [name] is at higher than averagerisk for developing diabetes due to her obesity. She currently denies polyuria or polydipsia.  ASSESSMENT AND PLAN:  Vitamin D deficiency - Plan: Vitamin D, Ergocalciferol, (DRISDOL) 1.25 MG (50000 UT) CAPS capsule  Insulin resistance - Plan: metFORMIN (GLUCOPHAGE) 500 MG tablet  At risk for diabetes mellitus  Class 3 severe obesity with serious comorbidity and body mass index (BMI) of 40.0 to 44.9 in adult, unspecified obesity type ([place])  PLAN: Vitamin D Deficiency Traci [name] was informed that low vitamin D levels contributes to fatigue and are associated with  obesity, breast, and colon cancer. She agrees to continue to take prescription Vit D @50 ,000 IU every week #4 with no refills and will follow up for routine testing of vitamin D, at least 2-3 times per year. She was informed of the risk of over-replacement of vitamin D and agrees to not increase her dose unless she discusses this with [place] first. Agrees to follow up with our clinic as directed.   Insulin Resistance Traci [name] will continue to work on weight loss, exercise, and decreasing simple carbohydrates in her diet to help decrease the risk of diabetes. We dicussed metformin including benefits and risks. She was informed that eating too many simple carbohydrates or too many calories at one sitting increases the likelihood of GI side effects. Traci [name] agrees to continue to start Metformin 500 mg qAM #30 with no refills.  Traci [name] agreed to follow up with [place] as directed to monitor her progress.  Diabetes risk counseling Traci [name] was given extended (15 minutes) diabetes prevention counseling today. She is 38 y.o. female and has risk factors for diabetes including obesity. We discussed intensive lifestyle modifications today with an emphasis on weight loss as well as increasing exercise and decreasing simple carbohydrates in her diet.  Obesity Traci [name] is currently in the action stage of change. As such, her goal is to continue with weight loss efforts She has agreed to follow the Category 3 plan Traci [name] has been instructed to work up to a goal of 150 minutes of combined cardio and strengthening exercise per week for weight loss and overall health benefits. We discussed  the following Behavioral Modification Strategies today: increasing lean protein intake, decreasing simple carbohydrates  and work on meal planning and easy cooking plans   Traci [name] has agreed to follow up with our clinic in 2 weeks. She was informed of the importance of frequent follow up visits to maximize her success with  intensive lifestyle modifications for her multiple health conditions.  ALLERGIES: Allergies  Allergen Reactions  . Wellbutrin [Bupropion] Anxiety  . Adhesive [Tape]     MEDICATIONS: Current Outpatient Medications on File Prior to Visit  Medication Sig Dispense Refill  . acetaminophen (TYLENOL) 325 MG tablet Take 650 mg by mouth every 6 (six) hours as needed.    Marland Kitchen azaTHIOprine (IMURAN) 50 MG tablet Take 125 mg by mouth daily.    . clonazePAM (KLONOPIN) 0.5 MG tablet TAKE 1/2 TO 1 TABLET BY MOUTH TWICE DAILY AS NEEDED (Patient taking differently: 1/2 po q am, 1/2 lunch, 1/2 afternoon, 1 po qhs.) 180 tablet 0  . cyanocobalamin (,VITAMIN B-12,) 1000 MCG/ML injection Inject 1,000 mcg into the muscle every 30 (thirty) days.    . ferrous sulfate 325 (65 FE) MG tablet Take by mouth.    . folic acid (FOLVITE) 1 MG tablet Take 1 mg by mouth daily.    Marland Kitchen ibuprofen (ADVIL) 200 MG tablet Take 200 mg by mouth every 6 (six) hours as needed.    . inFLIXimab (REMICADE IV) Inject into the vein.    Marland Kitchen lamoTRIgine (LAMICTAL) 100 MG tablet Take 1.5 tablets (150 mg total) by mouth daily. 135 tablet 0  . loratadine (CLARITIN) 10 MG tablet Take 10 mg by mouth daily.    . Melatonin 10 MG TABS Take 10 mg by mouth.    . montelukast (SINGULAIR) 10 MG tablet Take 10 mg by mouth at bedtime.    Marland Kitchen omeprazole (PRILOSEC) 20 MG capsule Take 20 mg by mouth daily.    Marland Kitchen rOPINIRole (REQUIP) 4 MG tablet Take 2 tablets (8 mg total) by mouth at bedtime. 180 tablet 1  . sertraline (ZOLOFT) 100 MG tablet Take 2 tablets (200 mg total) by mouth daily. 180 tablet 1   No current facility-administered medications on file prior to visit.     PAST MEDICAL HISTORY: Past Medical History:  Diagnosis Date  . Anemia   . Anxiety   . Blood clot in vein    Right lower leg after surgery  . Crohn's disease ([place])   . Depression   . Dyspnea   . Elevated BP without diagnosis of hypertension   . Enlarged liver   . GERD (gastroesophageal  reflux disease)   . IBS (irritable bowel syndrome)   . Laceration of left little finger 02/28/2018  . Lower extremity edema   . Restless legs   . Seasonal allergies   . Uterine fibroid   . Vitamin B 12 deficiency   . Vitamin D deficiency     PAST SURGICAL HISTORY: Past Surgical History:  Procedure Laterality Date  . BOWEL RESECTION    . BREAST BIOPSY    . NASAL SINUS SURGERY      SOCIAL HISTORY: Social History   Tobacco Use  . Smoking status: Never Smoker  . Smokeless tobacco: Never Used  Substance Use Topics  . Alcohol use: Never    Frequency: Never  . Drug use: Never    FAMILY HISTORY: Family History  Problem Relation Age of Onset  . Heart failure Other   . Leukemia Paternal Grandmother   . Hyperlipidemia Mother   .  Thyroid disease Mother   . Anxiety disorder Mother   . Depression Mother     ROS: Review of Systems  Constitutional: Positive for weight loss.  Gastrointestinal: Negative for nausea and vomiting.  Musculoskeletal:       Negative for muscle weakness    PHYSICAL EXAM: Blood pressure 128/79, pulse (!) 104, temperature 98.1 F (36.7 C), temperature source Oral, height 5' 7"  (1.702 m), weight 275 lb (124.7 kg), last menstrual period 01/09/2019, SpO2 98 %. Body mass index is 43.07 kg/m. Physical Exam Vitals signs reviewed.  Constitutional:      Appearance: Normal appearance. She is obese.  HENT:     Head: Normocephalic.     Nose: Nose normal.  Neck:     Musculoskeletal: Normal range of motion.  Cardiovascular:     Rate and Rhythm: Normal rate.  Pulmonary:     Effort: Pulmonary effort is normal.  Musculoskeletal: Normal range of motion.  Skin:    General: Skin is warm and dry.  Neurological:     Mental Status: She is alert and oriented to person, place, and time.  Psychiatric:        Mood and Affect: Mood normal.        Behavior: Behavior normal.     RECENT LABS AND TESTS: BMET    Component Value Date/Time   NA 141 12/02/2018  1355   K 4.4 12/02/2018 1355   CL 103 12/02/2018 1355   CO2 22 12/02/2018 1355   GLUCOSE 91 12/02/2018 1355   BUN 15 12/02/2018 1355   CREATININE 0.74 12/02/2018 1355   CALCIUM 9.3 12/02/2018 1355   GFRNONAA 105 12/02/2018 1355   GFRAA 121 12/02/2018 1355   Lab Results  Component Value Date   HGBA1C 5.1 12/02/2018   Lab Results  Component Value Date   INSULIN 11.7 12/02/2018   CBC    Component Value Date/Time   WBC 6.7 12/02/2018 1355   RBC 4.88 12/02/2018 1355   HGB 13.6 12/02/2018 1355   HCT 42.8 12/02/2018 1355   MCV 88 12/02/2018 1355   MCH 27.9 12/02/2018 1355   MCHC 31.8 12/02/2018 1355   RDW 15.4 12/02/2018 1355   LYMPHSABS 1.5 12/02/2018 1355   EOSABS 0.2 12/02/2018 1355   BASOSABS 0.1 12/02/2018 1355   Iron/TIBC/Ferritin/ %Sat No results found for: IRON, TIBC, FERRITIN, IRONPCTSAT Lipid Panel     Component Value Date/Time   CHOL 232 (H) 12/02/2018 1355   TRIG 132 12/02/2018 1355   HDL 60 12/02/2018 1355   LDLCALC 146 (H) 12/02/2018 1355   Hepatic Function Panel     Component Value Date/Time   PROT 6.5 12/02/2018 1355   ALBUMIN 4.2 12/02/2018 1355   AST 27 12/02/2018 1355   ALT 14 12/02/2018 1355   ALKPHOS 101 12/02/2018 1355   BILITOT 0.3 12/02/2018 1355      Component Value Date/Time   TSH 1.790 12/02/2018 1355     Ref. Range 12/02/2018 13:55  Vitamin D, 25-Hydroxy Latest Ref Range: 30.0 - 100.0 ng/mL 11.4 (L)     OBESITY BEHAVIORAL INTERVENTION VISIT  Today's visit was # 4   Starting weight: 297 lbs Starting date: 12/02/18 Today's weight : Weight: 275 lb (124.7 kg)  Today's date: 01/22/2019 Total lbs lost to date: 22 lbs At least 15 minutes were spent on discussing the following behavioral intervention visit.   ASK: We discussed the diagnosis of obesity with Kathrynn Humble today and [place] agreed to give [place] permission to discuss obesity behavioral  modification therapy today.  ASSESS: Tyrhonda has the diagnosis of obesity and  her BMI today is 43.06 Semaja is in the action stage of change   ADVISE: Tu was educated on the multiple health risks of obesity as well as the benefit of weight loss to improve her health. She was advised of the need for long term treatment and the importance of lifestyle modifications to improve her current health and to decrease her risk of future health problems.  AGREE: Multiple dietary modification options and treatment options were discussed and  Glorene agreed to follow the recommendations documented in the above note.  ARRANGE: Sehaj was educated on the importance of frequent visits to treat obesity as outlined per CMS and USPSTF guidelines and agreed to schedule her next follow up appointment today.  I, Renee Ramus, am acting as transcriptionist for Dennard Nip, MD   I have reviewed the above documentation for accuracy and completeness, and I agree with the above. -Dennard Nip, MD

## 2019-01-28 ENCOUNTER — Ambulatory Visit (INDEPENDENT_AMBULATORY_CARE_PROVIDER_SITE_OTHER): Payer: Self-pay | Admitting: Psychology

## 2019-02-02 ENCOUNTER — Other Ambulatory Visit: Payer: Self-pay

## 2019-02-02 ENCOUNTER — Ambulatory Visit (INDEPENDENT_AMBULATORY_CARE_PROVIDER_SITE_OTHER): Payer: BC Managed Care – PPO | Admitting: Physician Assistant

## 2019-02-02 ENCOUNTER — Encounter: Payer: Self-pay | Admitting: Physician Assistant

## 2019-02-02 DIAGNOSIS — F331 Major depressive disorder, recurrent, moderate: Secondary | ICD-10-CM | POA: Diagnosis not present

## 2019-02-02 DIAGNOSIS — F3281 Premenstrual dysphoric disorder: Secondary | ICD-10-CM | POA: Diagnosis not present

## 2019-02-02 DIAGNOSIS — G2581 Restless legs syndrome: Secondary | ICD-10-CM

## 2019-02-02 DIAGNOSIS — F411 Generalized anxiety disorder: Secondary | ICD-10-CM

## 2019-02-02 MED ORDER — CLONAZEPAM 0.5 MG PO TABS: ORAL_TABLET | ORAL | 0 refills | Status: DC

## 2019-02-02 NOTE — Progress Notes (Signed)
[facility] Check  Patient ID: Traci [name],  MRN: [id number]  PCP: Algis Greenhouse, MD  Date of Evaluation: 02/02/2019 Time spent:15 minutes  Chief Complaint:  Chief Complaint    Depression; Follow-up      HISTORY/CURRENT STATUS: HPI For routine med check.   At [place], we increased the Lamictal. "I can tell it's helping. My husband said to tell you he feels like I'm the person I used to be. I feel so much better!"  Able to enjoy things, energy and motivation are good, not crying as easily, not isolating, no SI/HI.    She's still seeing Dr. Leafy Ro at the weight loss clinic. Has lost 22 #! Feels a lot better physically too.  Not sure if increasing the Zoloft before menses is helping or not.  Her period is irregular anyway so it's hard to know when to increase it.   Denies dizziness, syncope, seizures, numbness, tingling, tremor, tics, unsteady gait, slurred speech, confusion. Denies muscle or joint pain, stiffness, or dystonia.  Individual Medical History/ Review of Systems: Changes? :No    Past medications for mental health diagnoses include: Zoloft, Xanax, Wellbutrin, Lexapro, Effexor, Luvox, Cymbaltacaused more anxiety then depression  Allergies: Wellbutrin [bupropion] and Adhesive [tape]  Current Medications:  Current Outpatient Medications:  .  acetaminophen (TYLENOL) 325 MG tablet, Take 650 mg by mouth every 6 (six) hours as needed., Disp: , Rfl:  .  azaTHIOprine (IMURAN) 50 MG tablet, Take 125 mg by mouth daily., Disp: , Rfl:  .  clonazePAM (KLONOPIN) 0.5 MG tablet, 1/2 po q am, 1/2 lunch, 1/2 afternoon, 1 po qhs., Disp: 225 tablet, Rfl: 0 .  cyanocobalamin (,VITAMIN B-12,) 1000 MCG/ML injection, Inject 1,000 mcg into the muscle every 30 (thirty) days., Disp: , Rfl:  .  ferrous sulfate 325 (65 FE) MG tablet, Take by mouth., Disp: , Rfl:  .  folic acid (FOLVITE) 1 MG tablet, Take 1 mg by mouth daily., Disp: , Rfl:  .  ibuprofen (ADVIL) 200 MG tablet, Take 200 mg  by mouth every 6 (six) hours as needed., Disp: , Rfl:  .  inFLIXimab (REMICADE IV), Inject into the vein., Disp: , Rfl:  .  lamoTRIgine (LAMICTAL) 100 MG tablet, Take 1.5 tablets (150 mg total) by mouth daily., Disp: 135 tablet, Rfl: 0 .  loratadine (CLARITIN) 10 MG tablet, Take 10 mg by mouth daily., Disp: , Rfl:  .  Melatonin 10 MG TABS, Take 10 mg by mouth., Disp: , Rfl:  .  metFORMIN (GLUCOPHAGE) 500 MG tablet, Take 1 tablet (500 mg total) by mouth daily with breakfast., Disp: 30 tablet, Rfl: 0 .  montelukast (SINGULAIR) 10 MG tablet, Take 10 mg by mouth at bedtime., Disp: , Rfl:  .  omeprazole (PRILOSEC) 20 MG capsule, Take 20 mg by mouth daily., Disp: , Rfl:  .  rOPINIRole (REQUIP) 4 MG tablet, Take 2 tablets (8 mg total) by mouth at bedtime., Disp: 180 tablet, Rfl: 1 .  sertraline (ZOLOFT) 100 MG tablet, Take 2 tablets (200 mg total) by mouth daily. (Patient taking differently: Take 200 mg by mouth daily. With increase to 255m before menses.), Disp: 180 tablet, Rfl: 1 .  Vitamin D, Ergocalciferol, (DRISDOL) 1.25 MG (50000 UT) CAPS capsule, Take 1 capsule (50,000 Units total) by mouth every 7 (seven) days., Disp: 4 capsule, Rfl: 0 Medication Side Effects: none  Family Medical/ Social History: Changes? No  MENTAL HEALTH EXAM:  Last menstrual period 01/09/2019.There is no height or weight on file to calculate  BMI.  General Appearance: Casual and Obese  Eye Contact:  Good  Speech:  Clear and Coherent  Volume:  Normal  Mood:  Euthymic  Affect:  Appropriate  Thought Process:  Goal Directed and Descriptions of Associations: Intact  Orientation:  Full (Time, Place, and Person)  Thought Content: Logical   Suicidal Thoughts:  No  Homicidal Thoughts:  No  Memory:  WNL  Judgement:  Good  Insight:  Good  Psychomotor Activity:  Normal  Concentration:  Concentration: Good  Recall:  Good  Fund of Knowledge: Good  Language: Good  Assets:  Desire for Improvement  ADL's:  Intact   Cognition: WNL  Prognosis:  Good    DIAGNOSES:    ICD-10-CM   1. Generalized anxiety disorder  F41.1   2. Major depressive disorder, recurrent episode, moderate (HCC)  F33.1   3. Restless leg syndrome  G25.81   4. PMDD (premenstrual dysphoric disorder)  F32.81     Receiving Psychotherapy: No    RECOMMENDATIONS:  I'm glad to see her doing so much better! Continue Klonopin 0.5 mg, 1/2 q am, 1/2 at lunch, 1/2 at supper, 1 qhs all  Prn. Continue Lamictal 100 mg, 1.5 pills qhs. Cont Requip 4 mg, 2 qhs Cont Zoloft 100 mg, 2 qd but increase to 2.5 pills qd the week before menses.  Decrease back to 2 pills daily when she starts her menses. Return in 2 months.  [name], PA-C

## 2019-02-04 ENCOUNTER — Other Ambulatory Visit: Payer: Self-pay | Admitting: Physician Assistant

## 2019-02-04 ENCOUNTER — Ambulatory Visit (INDEPENDENT_AMBULATORY_CARE_PROVIDER_SITE_OTHER): Payer: Self-pay | Admitting: Psychology

## 2019-02-05 ENCOUNTER — Ambulatory Visit (INDEPENDENT_AMBULATORY_CARE_PROVIDER_SITE_OTHER): Payer: BC Managed Care – PPO | Admitting: Family Medicine

## 2019-02-16 ENCOUNTER — Ambulatory Visit (INDEPENDENT_AMBULATORY_CARE_PROVIDER_SITE_OTHER): Payer: BC Managed Care – PPO | Admitting: Family Medicine

## 2019-02-16 ENCOUNTER — Encounter (INDEPENDENT_AMBULATORY_CARE_PROVIDER_SITE_OTHER): Payer: Self-pay | Admitting: Family Medicine

## 2019-02-16 ENCOUNTER — Other Ambulatory Visit: Payer: Self-pay

## 2019-02-16 VITALS — BP 109/77 | HR 101 | Temp 98.1°F | Ht 67.0 in | Wt 278.0 lb

## 2019-02-16 DIAGNOSIS — Z6841 Body Mass Index (BMI) 40.0 and over, adult: Secondary | ICD-10-CM

## 2019-02-16 DIAGNOSIS — Z9189 Other specified personal risk factors, not elsewhere classified: Secondary | ICD-10-CM

## 2019-02-16 DIAGNOSIS — E8881 Metabolic syndrome: Secondary | ICD-10-CM | POA: Diagnosis not present

## 2019-02-16 DIAGNOSIS — E559 Vitamin D deficiency, unspecified: Secondary | ICD-10-CM | POA: Diagnosis not present

## 2019-02-16 MED ORDER — VITAMIN D (ERGOCALCIFEROL) 1.25 MG (50000 UNIT) PO CAPS: 50000.0000 [IU] | ORAL_CAPSULE | ORAL | 0 refills | Status: DC

## 2019-02-16 NOTE — Progress Notes (Signed)
Office: [phone]  /  Fax: [phone]   HPI:   Chief Complaint: OBESITY Traci [name] is here to discuss her progress with her obesity treatment plan. She is on the Category 3 plan and is following her eating plan approximately 50 % of the time. She states she is exercising 0 minutes 0 times per week. Traci [name] has been on vacation and has had increased celebration eating. She feels bloated now with her weight gain and is ready to get back on track. Traci [name] is especially struggling with meal planning.  Her weight is 278 lb (126.1 kg) today and has had a weight gain of 3 pounds over a period of 3 weeks since her last visit. She has lost 19 lbs since starting treatment with [place].  Insulin Resistance Traci [name] has a diagnosis of insulin resistance based on her elevated fasting insulin level >5. Although Traci [name]'s blood glucose readings are still under good control, insulin resistance puts her at greater risk of metabolic syndrome and diabetes. She was started on metformin, but struggled yo follow the plan and had diarrhea. She stopped after 3 days.  At risk for diabetes Traci [name] is at higher than average risk for developing diabetes due to her insulin resistance and obesity.   Vitamin D Deficiency Traci [name] has a diagnosis of vitamin D deficiency. She is currently stable on vit D, but is not yet at goal.   ASSESSMENT AND PLAN:  Insulin resistance  Vitamin D deficiency - Plan: Vitamin D, Ergocalciferol, (DRISDOL) 1.25 MG (50000 UT) CAPS capsule  At risk for diabetes mellitus  Class 3 severe obesity with serious comorbidity and body mass index (BMI) of 40.0 to 44.9 in adult, unspecified obesity type ([place])  PLAN:  Insulin Resistance Traci [name] will continue to work on weight loss, exercise, and decreasing simple carbohydrates in her diet to help decrease the risk of diabetes. She was informed that eating too many simple carbohydrates or too many calories at one sitting increases the  likelihood of GI side effects. Traci [name] agreed to discontinue metformin for now and may consider again in the future. Traci [name] agrees to follow up in 2 weeks.  Diabetes risk counseling Traci [name] was given extended (15 minutes) diabetes prevention counseling today. She is 38 y.o. female and has risk factors for diabetes including insulin resistance and obesity. We discussed intensive lifestyle modifications today with an emphasis on weight loss as well as increasing exercise and decreasing simple carbohydrates in her diet.  Vitamin D Deficiency Traci [name] was informed that low vitamin D levels contribute to fatigue and are associated with obesity, breast, and colon cancer. Traci [name] agrees to continue to take prescription Vit D @50 ,000 IU every week #4 with no refills and will follow up for routine testing of vitamin D, at least 2-3 times per year. She was informed of the risk of over-replacement of vitamin D and agrees to not increase her dose unless she discusses this with [place] first. Traci [name] agrees to follow up in 2 weeks as directed.  Obesity Traci [name] is currently in the action stage of change. As such, her goal is to continue with weight loss efforts. She has agreed to keep a food journal with 400 to 600 calories and 40 grams of protein or follow the Category 3 plan. Traci [name] has been instructed to work up to a goal of 150 minutes of combined cardio and strengthening exercise per week for weight loss and overall health benefits. We discussed the following Behavioral Modification Strategies today: increasing lean protein intake and decreasing simple carbohydrates.  Traci [name] has agreed to follow up with our clinic in 2 weeks. She was informed of the importance of frequent follow up visits to maximize her success with intensive lifestyle modifications for her multiple health conditions.  ALLERGIES: Allergies  Allergen Reactions  . Wellbutrin [Bupropion] Anxiety  . Adhesive [Tape]      MEDICATIONS: Current Outpatient Medications on File Prior to Visit  Medication Sig Dispense Refill  . acetaminophen (TYLENOL) 325 MG tablet Take 650 mg by mouth every 6 (six) hours as needed.    Marland Kitchen azaTHIOprine (IMURAN) 50 MG tablet Take 125 mg by mouth daily.    . clonazePAM (KLONOPIN) 0.5 MG tablet 1/2 po q am, 1/2 lunch, 1/2 afternoon, 1 po qhs. 225 tablet 0  . cyanocobalamin (,VITAMIN B-12,) 1000 MCG/ML injection Inject 1,000 mcg into the muscle every 30 (thirty) days.    . ferrous sulfate 325 (65 FE) MG tablet Take by mouth.    . folic acid (FOLVITE) 1 MG tablet Take 1 mg by mouth daily.    Marland Kitchen ibuprofen (ADVIL) 200 MG tablet Take 200 mg by mouth every 6 (six) hours as needed.    . inFLIXimab (REMICADE IV) Inject into the vein.    Marland Kitchen lamoTRIgine (LAMICTAL) 100 MG tablet Take 1.5 tablets (150 mg total) by mouth daily. 135 tablet 0  . loratadine (CLARITIN) 10 MG tablet Take 10 mg by mouth daily.    . Melatonin 10 MG TABS Take 10 mg by mouth.    . montelukast (SINGULAIR) 10 MG tablet Take 10 mg by mouth at bedtime.    Marland Kitchen omeprazole (PRILOSEC) 20 MG capsule Take 20 mg by mouth daily.    Marland Kitchen rOPINIRole (REQUIP) 4 MG tablet Take 2 tablets (8 mg total) by mouth at bedtime. 180 tablet 1  . sertraline (ZOLOFT) 100 MG tablet Take 2 tablets (200 mg total) by mouth daily. (Patient taking differently: Take 200 mg by mouth daily. With increase to 241m before menses.) 180 tablet 1   No current facility-administered medications on file prior to visit.     PAST MEDICAL HISTORY: Past Medical History:  Diagnosis Date  . Anemia   . Anxiety   . Blood clot in vein    Right lower leg after surgery  . Crohn's disease ([place]   . Depression   . Dyspnea   . Elevated BP without diagnosis of hypertension   . Enlarged liver   . GERD (gastroesophageal reflux disease)   . IBS (irritable bowel syndrome)   . Laceration of left little finger 02/28/2018  . Lower extremity edema   . Restless legs   . Seasonal  allergies   . Uterine fibroid   . Vitamin B 12 deficiency   . Vitamin D deficiency     PAST SURGICAL HISTORY: Past Surgical History:  Procedure Laterality Date  . BOWEL RESECTION    . BREAST BIOPSY    . NASAL SINUS SURGERY      SOCIAL HISTORY: Social History   Tobacco Use  . Smoking status: Never Smoker  . Smokeless tobacco: Never Used  Substance Use Topics  . Alcohol use: Never    Frequency: Never  . Drug use: Never    FAMILY HISTORY: Family History  Problem Relation Age of Onset  . Heart failure Other   . Leukemia Paternal Grandmother   . Hyperlipidemia Mother   . Thyroid disease Mother   . Anxiety disorder Mother   . Depression Mother     ROS: Review of Systems  Constitutional:  Negative for weight loss.  Gastrointestinal: Positive for diarrhea.    PHYSICAL EXAM: Blood pressure 109/77, pulse (!) 101, temperature 98.1 F (36.7 C), temperature source Oral, height 5' 7"  (1.702 m), weight 278 lb (126.1 kg), last menstrual period 02/02/2019, SpO2 98 %. Body mass index is 43.54 kg/m. Physical Exam Vitals signs reviewed.  Constitutional:      Appearance: Normal appearance. She is obese.  Cardiovascular:     Rate and Rhythm: Normal rate.  Pulmonary:     Effort: Pulmonary effort is normal.  Musculoskeletal: Normal range of motion.  Skin:    General: Skin is warm and dry.  Neurological:     Mental Status: She is alert and oriented to person, place, and time.  Psychiatric:        Mood and Affect: Mood normal.        Behavior: Behavior normal.     RECENT LABS AND TESTS: BMET    Component Value Date/Time   NA 141 12/02/2018 1355   K 4.4 12/02/2018 1355   CL 103 12/02/2018 1355   CO2 22 12/02/2018 1355   GLUCOSE 91 12/02/2018 1355   BUN 15 12/02/2018 1355   CREATININE 0.74 12/02/2018 1355   CALCIUM 9.3 12/02/2018 1355   GFRNONAA 105 12/02/2018 1355   GFRAA 121 12/02/2018 1355   Lab Results  Component Value Date   HGBA1C 5.1 12/02/2018   Lab  Results  Component Value Date   INSULIN 11.7 12/02/2018   CBC    Component Value Date/Time   WBC 6.7 12/02/2018 1355   RBC 4.88 12/02/2018 1355   HGB 13.6 12/02/2018 1355   HCT 42.8 12/02/2018 1355   MCV 88 12/02/2018 1355   MCH 27.9 12/02/2018 1355   MCHC 31.8 12/02/2018 1355   RDW 15.4 12/02/2018 1355   LYMPHSABS 1.5 12/02/2018 1355   EOSABS 0.2 12/02/2018 1355   BASOSABS 0.1 12/02/2018 1355   Iron/TIBC/Ferritin/ %Sat No results found for: IRON, TIBC, FERRITIN, IRONPCTSAT Lipid Panel     Component Value Date/Time   CHOL 232 (H) 12/02/2018 1355   TRIG 132 12/02/2018 1355   HDL 60 12/02/2018 1355   LDLCALC 146 (H) 12/02/2018 1355   Hepatic Function Panel     Component Value Date/Time   PROT 6.5 12/02/2018 1355   ALBUMIN 4.2 12/02/2018 1355   AST 27 12/02/2018 1355   ALT 14 12/02/2018 1355   ALKPHOS 101 12/02/2018 1355   BILITOT 0.3 12/02/2018 1355      Component Value Date/Time   TSH 1.790 12/02/2018 1355   Results for SEBRINA, KESSNER (MRN [id number]) as of 02/16/2019 11:56  Ref. Range 12/02/2018 13:55  Vitamin D, 25-Hydroxy Latest Ref Range: 30.0 - 100.0 ng/mL 11.4 (L)    OBESITY BEHAVIORAL INTERVENTION VISIT  Today's visit was # 5   Starting weight: 297 lbs Starting date: 12/02/18 Today's weight : Weight: 278 lb (126.1 kg)  Today's date: 02/16/2019 Total lbs lost to date: 19    02/16/2019  Height 5' 7"  (1.702 m)  Weight 278 lb (126.1 kg)  BMI (Calculated) 43.53  BLOOD PRESSURE - SYSTOLIC 409  BLOOD PRESSURE - DIASTOLIC 77   Body Fat % 49 %  Total Body Water (lbs) 103.8 lbs    ASK: We discussed the diagnosis of obesity with Traci [name] today and Traci [name] agreed to give [place] permission to discuss obesity behavioral modification therapy today.  ASSESS: Traci [name] has the diagnosis of obesity and her BMI today is 43.53. Natayla is in the action stage  of change   ADVISE: Traci [name] was educated on the multiple health risks of obesity as well  as the benefit of weight loss to improve her health. She was advised of the need for long term treatment and the importance of lifestyle modifications to improve her current health and to decrease her risk of future health problems.  AGREE: Multiple dietary modification options and treatment options were discussed and Traci [name] agreed to follow the recommendations documented in the above note.  ARRANGE: Traci [name] was educated on the importance of frequent visits to treat obesity as outlined per CMS and USPSTF guidelines and agreed to schedule her next follow up appointment today.  IMarcille Blanco, CMA, am acting as transcriptionist for Starlyn Skeans, MD I have reviewed the above documentation for accuracy and completeness, and I agree with the above. -Dennard Nip, MD

## 2019-03-05 ENCOUNTER — Ambulatory Visit (INDEPENDENT_AMBULATORY_CARE_PROVIDER_SITE_OTHER): Payer: BC Managed Care – PPO | Admitting: Family Medicine

## 2019-03-17 ENCOUNTER — Other Ambulatory Visit: Payer: Self-pay | Admitting: Physician Assistant

## 2019-03-26 ENCOUNTER — Ambulatory Visit (INDEPENDENT_AMBULATORY_CARE_PROVIDER_SITE_OTHER): Payer: BC Managed Care – PPO | Admitting: Family Medicine

## 2019-03-30 ENCOUNTER — Ambulatory Visit: Payer: BC Managed Care – PPO | Admitting: Physician Assistant

## 2019-04-08 ENCOUNTER — Other Ambulatory Visit: Payer: Self-pay

## 2019-04-08 ENCOUNTER — Telehealth: Payer: Self-pay | Admitting: Physician Assistant

## 2019-04-08 MED ORDER — LAMOTRIGINE 100 MG PO TABS: 150.0000 mg | ORAL_TABLET | Freq: Every day | ORAL | 0 refills | Status: DC

## 2019-04-08 NOTE — Telephone Encounter (Signed)
Refill submitted for 90 day Lamotrigine 100 mg take 1.5 tablets

## 2019-04-08 NOTE — Telephone Encounter (Signed)
Pt requesting a prescription for Lamictal 150. Stated her previous prescription was for 100 but she's taking 150. Send to the [facility] on [address].

## 2019-04-22 ENCOUNTER — Other Ambulatory Visit: Payer: Self-pay | Admitting: Physician Assistant

## 2019-04-27 ENCOUNTER — Ambulatory Visit: Payer: BC Managed Care – PPO | Admitting: Physician Assistant

## 2019-05-04 ENCOUNTER — Other Ambulatory Visit: Payer: Self-pay | Admitting: Physician Assistant

## 2019-05-04 NOTE — Telephone Encounter (Signed)
Apt 01/15 Gets 90 day refill

## 2019-05-22 ENCOUNTER — Encounter: Payer: Self-pay | Admitting: Physician Assistant

## 2019-05-22 ENCOUNTER — Ambulatory Visit (INDEPENDENT_AMBULATORY_CARE_PROVIDER_SITE_OTHER): Payer: BC Managed Care – PPO | Admitting: Physician Assistant

## 2019-05-22 DIAGNOSIS — F3281 Premenstrual dysphoric disorder: Secondary | ICD-10-CM | POA: Diagnosis not present

## 2019-05-22 DIAGNOSIS — E559 Vitamin D deficiency, unspecified: Secondary | ICD-10-CM

## 2019-05-22 DIAGNOSIS — F411 Generalized anxiety disorder: Secondary | ICD-10-CM

## 2019-05-22 DIAGNOSIS — G2581 Restless legs syndrome: Secondary | ICD-10-CM

## 2019-05-22 DIAGNOSIS — F3341 Major depressive disorder, recurrent, in partial remission: Secondary | ICD-10-CM | POA: Diagnosis not present

## 2019-05-22 MED ORDER — LAMOTRIGINE 100 MG PO TABS: 100.0000 mg | ORAL_TABLET | Freq: Two times a day (BID) | ORAL | 0 refills | Status: DC

## 2019-05-22 MED ORDER — SERTRALINE HCL 100 MG PO TABS: 200.0000 mg | ORAL_TABLET | Freq: Every day | ORAL | 0 refills | Status: DC

## 2019-05-22 MED ORDER — CLONAZEPAM 0.5 MG PO TABS: ORAL_TABLET | ORAL | 0 refills | Status: DC

## 2019-05-22 MED ORDER — VITAMIN D (ERGOCALCIFEROL) 1.25 MG (50000 UNIT) PO CAPS: 50000.0000 [IU] | ORAL_CAPSULE | ORAL | 0 refills | Status: DC

## 2019-05-22 NOTE — Progress Notes (Signed)
[facility]  Patient ID: Traci [name],  MRN: [id number]  PCP: Algis Greenhouse, MD  Date of Evaluation: 05/22/2019 Time spent:35 minutes  Chief Complaint:  Chief Complaint    Anxiety; Depression; Follow-up     Virtual Visit via Telephone Note  I connected with patient by a video enabled telemedicine application or telephone, with their informed consent, and verified patient privacy and that I am speaking with the correct person using two identifiers.  I am private, in my home and the patient is in her car.  I discussed the limitations, risks, security and privacy concerns of performing an evaluation and management service by telephone and the availability of in person appointments. I also discussed with the patient that there may be a patient responsible charge related to this service. The patient expressed understanding and agreed to proceed.   I discussed the assessment and treatment plan with the patient. The patient was provided an opportunity to ask questions and all were answered. The patient agreed with the plan and demonstrated an understanding of the instructions.   The patient was advised to call back or seek an in-person evaluation if the symptoms worsen or if the condition fails to improve as anticipated.  I provided 35 minutes of non-face-to-face time during this encounter.  HISTORY/CURRENT STATUS: HPI For routine med check.  Her husband, [name], is on the phone as well.  States she has been little more depressed here lately. "But I think with everything going on, the political problems, COVID, it being winter time, it is just a lot."  She is able to enjoy things when she has the chance.  Energy and motivation are slightly low but it does not prevent her from going to work or doing things that she needs to do.  Due to coronavirus pandemic, she does not have a lot of things to look forward to.  She cries easily at times but she reports now, as she has in the  past, that she is a sensitive person anyway.  She denies suicidal or homicidal thoughts.  She was given vitamin D, 50,000 IUs weekly for 4 weeks, last fall by her weight loss physician.  She has not taken any since November 2020.  She thinks it might have made her feel better and wonders if she should go back on it.  She does not get out in the sun very much and because of the ulcerative colitis, she does not absorb things as well as some people.  She is also aware that vitamin D is something that is recommended to help prevent and treat COVID so she thinks it might be a good idea to go back on it or even an over-the-counter supplement.  Anxiety is still a problem but the Klonopin does help when she takes it.  Sometimes, it just does not last long enough.  She has more of an underlying sense of anxiety instead of panic attacks.  Sometimes she will have palpitations and sweaty palms however.  The anxiety is not always triggered by something, it can just happen.  She is still increasing the Zoloft before her menses.  States it is hard to tell how much it really helps with the PMDD.  Her menses is still irregular so it is not easy trying to figure out when to start taking the extra Zoloft.  Ropinirole continues to help with the restlessness of her legs.  "I do not know what I would do without it."  She usually sleeps  well with that and with the help of melatonin.  Denies dizziness, syncope, seizures, numbness, tingling, tremor, tics, unsteady gait, slurred speech, confusion. Denies muscle or joint pain, stiffness, or dystonia.  Individual Medical History/ Review of Systems: Changes? :No   Ulcerative colitis is stable.  Past medications for mental health diagnoses include: Zoloft, Xanax, Wellbutrin, Lexapro, Effexor, Luvox, Cymbaltacaused more anxiety then depression  Allergies: Wellbutrin [bupropion] and Adhesive [tape]  Current Medications:  Current Outpatient Medications:  .  acetaminophen  (TYLENOL) 325 MG tablet, Take 650 mg by mouth every 6 (six) hours as needed., Disp: , Rfl:  .  azaTHIOprine (IMURAN) 50 MG tablet, Take 125 mg by mouth daily., Disp: , Rfl:  .  clonazePAM (KLONOPIN) 0.5 MG tablet, TAKE 1/2- 1 TABLET BY MOUTH three times DAILY AS NEEDED, Disp: 270 tablet, Rfl: 0 .  cyanocobalamin (,VITAMIN B-12,) 1000 MCG/ML injection, Inject 1,000 mcg into the muscle every 30 (thirty) days., Disp: , Rfl:  .  ferrous sulfate 325 (65 FE) MG tablet, Take by mouth., Disp: , Rfl:  .  folic acid (FOLVITE) 1 MG tablet, Take 1 mg by mouth daily., Disp: , Rfl:  .  ibuprofen (ADVIL) 200 MG tablet, Take 200 mg by mouth every 6 (six) hours as needed., Disp: , Rfl:  .  inFLIXimab (REMICADE IV), Inject into the vein., Disp: , Rfl:  .  lamoTRIgine (LAMICTAL) 100 MG tablet, Take 1 tablet (100 mg total) by mouth 2 (two) times daily., Disp: 180 tablet, Rfl: 0 .  loratadine (CLARITIN) 10 MG tablet, Take 10 mg by mouth daily., Disp: , Rfl:  .  Melatonin 10 MG TABS, Take 10 mg by mouth., Disp: , Rfl:  .  montelukast (SINGULAIR) 10 MG tablet, Take 10 mg by mouth at bedtime., Disp: , Rfl:  .  omeprazole (PRILOSEC) 20 MG capsule, Take 20 mg by mouth daily., Disp: , Rfl:  .  rOPINIRole (REQUIP) 4 MG tablet, TAKE 2 TABLETS(8 MG) BY MOUTH AT BEDTIME, Disp: 180 tablet, Rfl: 1 .  sertraline (ZOLOFT) 100 MG tablet, Take 2 tablets (200 mg total) by mouth daily. With increase to 248m before menses., Disp: 200 tablet, Rfl: 0 .  Vitamin D, Ergocalciferol, (DRISDOL) 1.25 MG (50000 UNIT) CAPS capsule, Take 1 capsule (50,000 Units total) by mouth every 7 (seven) days., Disp: 12 capsule, Rfl: 0 Medication Side Effects: none  Family Medical/ Social History: Changes? No  MENTAL HEALTH EXAM:  There were no vitals taken for this visit.There is no height or weight on file to calculate BMI.  General Appearance: unable to assess  Eye Contact:  unable to assess  Speech:  Clear and Coherent  Volume:  Normal  Mood:   Euthymic  Affect:  unable to assess  Thought Process:  Goal Directed and Descriptions of Associations: Intact  Orientation:  Full (Time, Place, and Person)  Thought Content: Logical   Suicidal Thoughts:  No  Homicidal Thoughts:  No  Memory:  WNL  Judgement:  Good  Insight:  Good  Psychomotor Activity:  unable to assess  Concentration:  Concentration: Good  Recall:  Good  Fund of Knowledge: Good  Language: Good  Assets:  Desire for Improvement  ADL's:  Intact  Cognition: WNL  Prognosis:  Good    DIAGNOSES:    ICD-10-CM   1. Recurrent major depressive disorder, in partial remission ([place]  F33.41   2. Vitamin D deficiency  E55.9 Vitamin D, Ergocalciferol, (DRISDOL) 1.25 MG (50000 UNIT) CAPS capsule    VITAMIN D  25 Hydroxy (Vit-D Deficiency, Fractures)  3. Generalized anxiety disorder  F41.1   4. PMDD (premenstrual dysphoric disorder)  F32.81   5. Restless leg syndrome  G25.81     Receiving Psychotherapy: No    RECOMMENDATIONS:  I spent 35 minutes with her and her husband. PDMP was reviewed. We discussed the anxiety.  She may be more tolerant to the Klonopin now so we agreed to increase the dose.  Also she should decrease caffeine, and use other coping skills that she has learned over the years.   As far as the worsening of depression, I think it would be beneficial to increase the Lamictal.  She and her husband agree. She does not have an appointment any time soon to see her weight loss provider.  I will be happy to check her vitamin D level and prescribe the vitamin D until she can get back in to see them. Increase Klonopin to 0.5 mg, 1/2-1 p.o. 3 times daily as needed. Increase Lamictal to 100 mg p.o. twice daily. Continue melatonin 10 mg nightly as needed. Continue ropinirole 4 mg, 2 nightly. Continue Zoloft 100 mg, 2 p.o. daily with an increase to 250 mg daily for 1 week prior to menses. Restart vitamin D 50,000 IUs 1 p.o. weekly.  She will hold off on starting that  until she has her labs drawn. Draw 25-hydroxy vitamin D level. Return in 4 to 6 weeks.  [name], PA-C

## 2019-06-24 ENCOUNTER — Ambulatory Visit (INDEPENDENT_AMBULATORY_CARE_PROVIDER_SITE_OTHER): Payer: BC Managed Care – PPO | Admitting: Physician Assistant

## 2019-06-24 ENCOUNTER — Encounter: Payer: Self-pay | Admitting: Physician Assistant

## 2019-06-24 ENCOUNTER — Other Ambulatory Visit: Payer: Self-pay

## 2019-06-24 DIAGNOSIS — G2581 Restless legs syndrome: Secondary | ICD-10-CM | POA: Diagnosis not present

## 2019-06-24 DIAGNOSIS — F411 Generalized anxiety disorder: Secondary | ICD-10-CM

## 2019-06-24 DIAGNOSIS — E559 Vitamin D deficiency, unspecified: Secondary | ICD-10-CM | POA: Diagnosis not present

## 2019-06-24 DIAGNOSIS — F3341 Major depressive disorder, recurrent, in partial remission: Secondary | ICD-10-CM | POA: Diagnosis not present

## 2019-06-24 MED ORDER — LAMOTRIGINE 200 MG PO TABS: 200.0000 mg | ORAL_TABLET | Freq: Every day | ORAL | 0 refills | Status: DC

## 2019-06-24 NOTE — Progress Notes (Signed)
[facility]  Patient ID: Traci [name],  MRN: [id number]  PCP: Algis Greenhouse, MD  Date of Evaluation: 06/24/2019 Time spent:20 minutes  Chief Complaint:  Chief Complaint    Depression; Anxiety      HISTORY/CURRENT STATUS: HPI for routine follow-up.  Her husband Traci [name] is with her.  At her last visit a month ago, we increased the Lamictal.  States she feels better.  Her husband has seen a change in her mood as well.  She is more able to enjoy things, energy and motivation are better, she is not as sad or tearful as she was.  Memory is okay and focus and concentration are fine.  She denies suicidal or homicidal thoughts.  Anxiety is about the same but is controlled with the Klonopin.  She sleeps well now at least most of the time.  Work is going fine.  She still works at [employer] and the [employer] clinic.  She found that she was forgetting to take the Lamictal twice daily so she started taking both at at bedtime.  She did not start the vitamin D weekly yet.  She never got the order from [place] but her GYN drew the lab work yesterday.  After she gets the results back then she will start the weekly dose.  Denies dizziness, syncope, seizures, numbness, tingling, tremor, tics, unsteady gait, slurred speech, confusion. Denies muscle or joint pain, stiffness, or dystonia.  Individual Medical History/ Review of Systems: Changes? :No    Past medications for mental health diagnoses include: Zoloft, Xanax, Wellbutrin, Lexapro, Effexor, Luvox, Cymbaltacaused more anxiety then depression  Allergies: Wellbutrin [bupropion] and Adhesive [tape]  Current Medications:  Current Outpatient Medications:  .  acetaminophen (TYLENOL) 325 MG tablet, Take 650 mg by mouth every 6 (six) hours as needed., Disp: , Rfl:  .  azaTHIOprine (IMURAN) 50 MG tablet, Take 125 mg by mouth daily., Disp: , Rfl:  .  clonazePAM (KLONOPIN) 0.5 MG tablet, TAKE 1/2- 1 TABLET BY MOUTH three times DAILY AS  NEEDED, Disp: 270 tablet, Rfl: 0 .  cyanocobalamin (,VITAMIN B-12,) 1000 MCG/ML injection, Inject 1,000 mcg into the muscle every 30 (thirty) days., Disp: , Rfl:  .  ferrous sulfate 325 (65 FE) MG tablet, Take by mouth., Disp: , Rfl:  .  folic acid (FOLVITE) 1 MG tablet, Take 1 mg by mouth daily., Disp: , Rfl:  .  ibuprofen (ADVIL) 200 MG tablet, Take 200 mg by mouth every 6 (six) hours as needed., Disp: , Rfl:  .  inFLIXimab (REMICADE IV), Inject into the vein., Disp: , Rfl:  .  loratadine (CLARITIN) 10 MG tablet, Take 10 mg by mouth daily., Disp: , Rfl:  .  Melatonin 10 MG TABS, Take 10 mg by mouth., Disp: , Rfl:  .  montelukast (SINGULAIR) 10 MG tablet, Take 10 mg by mouth at bedtime., Disp: , Rfl:  .  omeprazole (PRILOSEC) 20 MG capsule, Take 20 mg by mouth daily., Disp: , Rfl:  .  rOPINIRole (REQUIP) 4 MG tablet, TAKE 2 TABLETS(8 MG) BY MOUTH AT BEDTIME, Disp: 180 tablet, Rfl: 1 .  sertraline (ZOLOFT) 100 MG tablet, Take 2 tablets (200 mg total) by mouth daily. With increase to 264m before menses., Disp: 200 tablet, Rfl: 0 .  Vitamin D, Ergocalciferol, (DRISDOL) 1.25 MG (50000 UNIT) CAPS capsule, Take 1 capsule (50,000 Units total) by mouth every 7 (seven) days., Disp: 12 capsule, Rfl: 0 .  lamoTRIgine (LAMICTAL) 200 MG tablet, Take 1 tablet (200 mg total)  by mouth at bedtime., Disp: 90 tablet, Rfl: 0 Medication Side Effects: none  Family Medical/ Social History: Changes? Yes Her Dad had an MI on 06/11/2019, had stent placed. Doing great!   MENTAL HEALTH EXAM:  There were no vitals taken for this visit.There is no height or weight on file to calculate BMI.  General Appearance: Casual, Neat, Well Groomed and Obese  Eye Contact:  Good  Speech:  Clear and Coherent  Volume:  Normal  Mood:  Euthymic  Affect:  Appropriate  Thought Process:  Goal Directed and Descriptions of Associations: Intact  Orientation:  Full (Time, Place, and Person)  Thought Content: Logical   Suicidal Thoughts:   No  Homicidal Thoughts:  No  Memory:  WNL  Judgement:  Good  Insight:  Good  Psychomotor Activity:  Normal  Concentration:  Concentration: Good and Attention Span: Good  Recall:  Good  Fund of Knowledge: Good  Language: Good  Assets:  Desire for Improvement  ADL's:  Intact  Cognition: WNL  Prognosis:  Good    DIAGNOSES:    ICD-10-CM   1. Recurrent major depressive disorder, in partial remission ([place])  F33.41   2. Generalized anxiety disorder  F41.1   3. Restless leg syndrome  G25.81   4. Vitamin D deficiency  E55.9     Receiving Psychotherapy: No    RECOMMENDATIONS:  PDMP was reviewed. I am glad she is doing so much better. I spent 20 minutes with her and her husband. Continue Klonopin 0.5 mg 1 3 times daily as needed. Continue Lamictal 200 mg nightly. Continue ropinirole 4 mg, 2 nightly. Continue Zoloft 100 mg, 2 p.o. daily. Return in 3 months.  [name], PA-C

## 2019-06-29 NOTE — Progress Notes (Signed)
Let her know Vit D is very low (11) so take the Rx she has for 50,000 iu q wk.  Need to repeat lab in 3 months.

## 2019-09-09 ENCOUNTER — Ambulatory Visit: Payer: BC Managed Care – PPO | Admitting: Physician Assistant

## 2019-09-23 ENCOUNTER — Other Ambulatory Visit: Payer: Self-pay

## 2019-09-23 ENCOUNTER — Encounter: Payer: Self-pay | Admitting: Physician Assistant

## 2019-09-23 ENCOUNTER — Ambulatory Visit (INDEPENDENT_AMBULATORY_CARE_PROVIDER_SITE_OTHER): Payer: BC Managed Care – PPO | Admitting: Physician Assistant

## 2019-09-23 DIAGNOSIS — F411 Generalized anxiety disorder: Secondary | ICD-10-CM | POA: Diagnosis not present

## 2019-09-23 DIAGNOSIS — F3281 Premenstrual dysphoric disorder: Secondary | ICD-10-CM

## 2019-09-23 DIAGNOSIS — G2581 Restless legs syndrome: Secondary | ICD-10-CM | POA: Diagnosis not present

## 2019-09-23 DIAGNOSIS — F3341 Major depressive disorder, recurrent, in partial remission: Secondary | ICD-10-CM

## 2019-09-23 MED ORDER — SERTRALINE HCL 100 MG PO TABS: 200.0000 mg | ORAL_TABLET | Freq: Every day | ORAL | 1 refills | Status: DC

## 2019-09-23 MED ORDER — LAMOTRIGINE 100 MG PO TABS: 250.0000 mg | ORAL_TABLET | Freq: Every day | ORAL | 1 refills | Status: DC

## 2019-09-23 MED ORDER — ROPINIROLE HCL 4 MG PO TABS: ORAL_TABLET | ORAL | 1 refills | Status: DC

## 2019-09-23 NOTE — Progress Notes (Signed)
[facility]  Patient ID: Traci [name],  MRN: [id number]  PCP: Algis Greenhouse, MD  Date of Evaluation: 09/23/2019 Time spent:20 minutes  Chief Complaint:  Chief Complaint    Follow-up      HISTORY/CURRENT STATUS: HPI for routine follow-up.  Her husband [name] is with her.  Feels like she has been a bit more depressed.  Not wanting to shower for several days.  Feels lazy and likes to sit on the couch and not do much of anything else.  She is working her routine schedule, as an Geologist, engineering at [employer].  Has very vivid dreams but is sleeping better since she stopped taking melatonin a week or so ago.  Feels anxious though even during the night, she will wake up and feel like she is in a ball of nerves.  She over thinks a lot of things.  The Klonopin does help with the anxiety but she still has generalized anxiety even with the Klonopin.  PMDD is not as severe lately.  She does not always remember to increase the Zoloft the week before menses either.  No suicidal or homicidal thoughts.  Denies dizziness, syncope, seizures, numbness, tingling, tremor, tics, unsteady gait, slurred speech, confusion. Denies muscle or joint pain, stiffness, or dystonia.  Individual Medical History/ Review of Systems: Changes? :No    Past medications for mental health diagnoses include: Zoloft, Xanax, Wellbutrin, Lexapro, Effexor, Luvox, Cymbaltacaused more anxiety then depression  Allergies: Wellbutrin [bupropion] and Adhesive [tape]  Current Medications:  Current Outpatient Medications:  .  acetaminophen (TYLENOL) 325 MG tablet, Take 650 mg by mouth every 6 (six) hours as needed., Disp: , Rfl:  .  azaTHIOprine (IMURAN) 50 MG tablet, Take 125 mg by mouth daily., Disp: , Rfl:  .  clonazePAM (KLONOPIN) 0.5 MG tablet, TAKE 1/2- 1 TABLET BY MOUTH three times DAILY AS NEEDED, Disp: 270 tablet, Rfl: 0 .  cyanocobalamin (,VITAMIN B-12,) 1000 MCG/ML injection, Inject 1,000 mcg  into the muscle every 30 (thirty) days., Disp: , Rfl:  .  ferrous sulfate 325 (65 FE) MG tablet, Take by mouth., Disp: , Rfl:  .  folic acid (FOLVITE) 1 MG tablet, Take 1 mg by mouth daily., Disp: , Rfl:  .  ibuprofen (ADVIL) 200 MG tablet, Take 200 mg by mouth every 6 (six) hours as needed., Disp: , Rfl:  .  inFLIXimab (REMICADE IV), Inject into the vein., Disp: , Rfl:  .  loratadine (CLARITIN) 10 MG tablet, Take 10 mg by mouth daily., Disp: , Rfl:  .  Melatonin 10 MG TABS, Take 10 mg by mouth., Disp: , Rfl:  .  montelukast (SINGULAIR) 10 MG tablet, Take 10 mg by mouth at bedtime., Disp: , Rfl:  .  omeprazole (PRILOSEC) 20 MG capsule, Take 20 mg by mouth daily., Disp: , Rfl:  .  rOPINIRole (REQUIP) 4 MG tablet, TAKE 2 TABLETS(8 MG) BY MOUTH AT BEDTIME, Disp: 180 tablet, Rfl: 1 .  sertraline (ZOLOFT) 100 MG tablet, Take 2 tablets (200 mg total) by mouth daily. With increase to 228m before menses., Disp: 200 tablet, Rfl: 1 .  Vitamin D, Ergocalciferol, (DRISDOL) 1.25 MG (50000 UNIT) CAPS capsule, Take 1 capsule (50,000 Units total) by mouth every 7 (seven) days., Disp: 12 capsule, Rfl: 0 .  lamoTRIgine (LAMICTAL) 100 MG tablet, Take 2.5 tablets (250 mg total) by mouth daily., Disp: 75 tablet, Rfl: 1 Medication Side Effects: none  Family Medical/ Social History: Changes? No  MENTAL HEALTH EXAM:  There were no vitals taken for this visit.There is no height or weight on file to calculate BMI.  General Appearance: Casual, Neat, Well Groomed and Obese  Eye Contact:  Good  Speech:  Clear and Coherent and Normal Rate  Volume:  Normal  Mood:  Euthymic  Affect:  Appropriate  Thought Process:  Goal Directed and Descriptions of Associations: Intact  Orientation:  Full (Time, Place, and Person)  Thought Content: Logical   Suicidal Thoughts:  No  Homicidal Thoughts:  No  Memory:  WNL  Judgement:  Good  Insight:  Good  Psychomotor Activity:  Normal  Concentration:  Concentration: Good and  Attention Span: Good  Recall:  Good  Fund of Knowledge: Good  Language: Good  Assets:  Desire for Improvement  ADL's:  Intact  Cognition: WNL  Prognosis:  Good    DIAGNOSES:    ICD-10-CM   1. Recurrent major depressive disorder, in partial remission ([place])  F33.41   2. Generalized anxiety disorder  F41.1   3. Restless leg syndrome  G25.81   4. PMDD (premenstrual dysphoric disorder)  F32.81     Receiving Psychotherapy: No    RECOMMENDATIONS:  PDMP was reviewed. I spent 20 minutes with her and her husband. Continue Klonopin 0.5 mg, 1 po 3 times daily as needed. Increase Lamictal to 250 mg total/day.  If she can tolerate it, take the 100 mg, 1.5 pills every morning and then 1 p.o. nightly.  If she is too drowsy, she can take the entire dose at night.   Continue ropinirole 4 mg, 2 nightly. Continue Zoloft 100 mg, 2 p.o. daily.  She increases it to 250 mg the week before menses. Return in 4 weeks.  [name], PA-C

## 2019-10-22 ENCOUNTER — Ambulatory Visit (INDEPENDENT_AMBULATORY_CARE_PROVIDER_SITE_OTHER): Payer: BC Managed Care – PPO | Admitting: Physician Assistant

## 2019-10-22 ENCOUNTER — Encounter: Payer: Self-pay | Admitting: Physician Assistant

## 2019-10-22 ENCOUNTER — Other Ambulatory Visit: Payer: Self-pay

## 2019-10-22 DIAGNOSIS — F3341 Major depressive disorder, recurrent, in partial remission: Secondary | ICD-10-CM | POA: Diagnosis not present

## 2019-10-22 DIAGNOSIS — F3281 Premenstrual dysphoric disorder: Secondary | ICD-10-CM

## 2019-10-22 DIAGNOSIS — Z3009 Encounter for other general counseling and advice on contraception: Secondary | ICD-10-CM

## 2019-10-22 DIAGNOSIS — G2581 Restless legs syndrome: Secondary | ICD-10-CM | POA: Diagnosis not present

## 2019-10-22 DIAGNOSIS — F411 Generalized anxiety disorder: Secondary | ICD-10-CM | POA: Diagnosis not present

## 2019-10-22 MED ORDER — LAMOTRIGINE 150 MG PO TABS: 150.0000 mg | ORAL_TABLET | Freq: Two times a day (BID) | ORAL | 0 refills | Status: DC

## 2019-10-22 MED ORDER — CLONAZEPAM 0.5 MG PO TABS: ORAL_TABLET | ORAL | 0 refills | Status: DC

## 2019-10-22 NOTE — Progress Notes (Signed)
[facility]  Patient ID: Traci [name],  MRN: [id number]  PCP: Algis Greenhouse, MD  Date of Evaluation: 10/22/2019 Time spent:30 minutes  Chief Complaint:  Chief Complaint    Anxiety; Depression      HISTORY/CURRENT STATUS: HPI for routine follow-up.  Her husband Traci [name] is with her.  At [place] 1 month ago, we increased the Lamictal. "I can tell it's doing something."  But thinks it will probably need to be increased. States the Lamictal has helped more than anything else ever has.  Is able to enjoy things, energy and motivation are ok but can be low d/t physical problems too.  No SI/HI.  Patient denies increased energy with decreased need for sleep, no increased talkativeness, no racing thoughts, no impulsivity or risky behaviors, no increased spending, no increased libido, no grandiosity, no increased irritability or anger, and no hallucinations.  Anxiety is pretty well-controlled. Klonopin does help a lot. Work is going ok. Sleeps well as long as the ropinerole helps the RLS.  She asked about medications that are safe in pregnancy for restless leg syndrome.  She and her husband are not trying to get pregnant right now but she is giving a "what if" scenario.  They are not even sure if they will try to have a child.  Denies dizziness, syncope, seizures, numbness, tingling, tremor, tics, unsteady gait, slurred speech, confusion. Denies muscle or joint pain, stiffness, or dystonia.  Individual Medical History/ Review of Systems: Changes? :No    Past medications for mental health diagnoses include: Zoloft, Xanax, Wellbutrin, Lexapro, Effexor, Luvox, Cymbaltacaused more anxiety then depression  Allergies: Wellbutrin [bupropion] and Adhesive [tape]  Current Medications:  Current Outpatient Medications:  .  acetaminophen (TYLENOL) 325 MG tablet, Take 650 mg by mouth every 6 (six) hours as needed., Disp: , Rfl:  .  azaTHIOprine (IMURAN) 50 MG tablet, Take 125 mg by mouth  daily., Disp: , Rfl:  .  clonazePAM (KLONOPIN) 0.5 MG tablet, TAKE 1/2- 1 TABLET BY MOUTH three times DAILY AS NEEDED, Disp: 270 tablet, Rfl: 0 .  cyanocobalamin (,VITAMIN B-12,) 1000 MCG/ML injection, Inject 1,000 mcg into the muscle every 30 (thirty) days., Disp: , Rfl:  .  ferrous sulfate 325 (65 FE) MG tablet, Take by mouth., Disp: , Rfl:  .  folic acid (FOLVITE) 1 MG tablet, Take 1 mg by mouth daily., Disp: , Rfl:  .  ibuprofen (ADVIL) 200 MG tablet, Take 200 mg by mouth every 6 (six) hours as needed., Disp: , Rfl:  .  inFLIXimab (REMICADE IV), Inject into the vein., Disp: , Rfl:  .  loratadine (CLARITIN) 10 MG tablet, Take 10 mg by mouth daily., Disp: , Rfl:  .  Melatonin 10 MG TABS, Take 10 mg by mouth., Disp: , Rfl:  .  montelukast (SINGULAIR) 10 MG tablet, Take 10 mg by mouth at bedtime., Disp: , Rfl:  .  omeprazole (PRILOSEC) 20 MG capsule, Take 20 mg by mouth daily., Disp: , Rfl:  .  rOPINIRole (REQUIP) 4 MG tablet, TAKE 2 TABLETS(8 MG) BY MOUTH AT BEDTIME, Disp: 180 tablet, Rfl: 1 .  sertraline (ZOLOFT) 100 MG tablet, Take 2 tablets (200 mg total) by mouth daily. With increase to 218m before menses., Disp: 200 tablet, Rfl: 1 .  lamoTRIgine (LAMICTAL) 150 MG tablet, Take 1 tablet (150 mg total) by mouth 2 (two) times daily., Disp: 180 tablet, Rfl: 0 .  Vitamin D, Ergocalciferol, (DRISDOL) 1.25 MG (50000 UNIT) CAPS capsule, Take 1 capsule (50,000 Units total)  by mouth every 7 (seven) days. (Patient not taking: Reported on 10/22/2019), Disp: 12 capsule, Rfl: 0 Medication Side Effects: none  Family Medical/ Social History: Changes? No  MENTAL HEALTH EXAM:  There were no vitals taken for this visit.There is no height or weight on file to calculate BMI.  General Appearance: Casual, Neat, Well Groomed and Obese  Eye Contact:  Good  Speech:  Clear and Coherent and Normal Rate  Volume:  Normal  Mood:  Euthymic  Affect:  Appropriate  Thought Process:  Goal Directed and Descriptions of  Associations: Intact  Orientation:  Full (Time, Place, and Person)  Thought Content: Logical   Suicidal Thoughts:  No  Homicidal Thoughts:  No  Memory:  WNL  Judgement:  Good  Insight:  Good  Psychomotor Activity:  Normal  Concentration:  Concentration: Good and Attention Span: Good  Recall:  Good  Fund of Knowledge: Good  Language: Good  Assets:  Desire for Improvement  ADL's:  Intact  Cognition: WNL  Prognosis:  Good    DIAGNOSES:    ICD-10-CM   1. Generalized anxiety disorder  F41.1   2. Recurrent major depressive disorder, in partial remission ([name])  F33.41   3. Restless leg syndrome  G25.81   4. PMDD (premenstrual dysphoric disorder)  F32.81   5. Family planning advice  Z30.09     Receiving Psychotherapy: No    RECOMMENDATIONS:  PDMP was reviewed. We discussed the effects of her mental health medications on a preborn child.  The Lamictal would be safe, and most likely the Zoloft would be, however the OB/GYN's prefer people not to be on any medications whatsoever.  If the benefit far outweighs the risk then it is worth her staying on it.  I would definitely recommend weaning off of the ropinirole and the Klonopin before she conceives.  Family planning was discussed briefly. Recommend that we increase Lamictal.  Think she will get even more benefit.  She and her husband agree. Increase Lamictal to 150 mg, 1 p.o. twice daily. Continue ropinirole 4 mg, 2 p.o. nightly. Continue Klonopin 0.5 mg, 1/2-1 3 times daily as needed. Continue Zoloft 100 mg, 2 p.o. q. daily with increase to 250 mg daily before menses, then back to 200 mg total. Return in 6 weeks.  [name], PA-C

## 2019-10-27 DIAGNOSIS — J324 Chronic pansinusitis: Secondary | ICD-10-CM | POA: Insufficient documentation

## 2019-12-04 ENCOUNTER — Other Ambulatory Visit: Payer: Self-pay

## 2019-12-04 ENCOUNTER — Ambulatory Visit (INDEPENDENT_AMBULATORY_CARE_PROVIDER_SITE_OTHER): Payer: BC Managed Care – PPO | Admitting: Physician Assistant

## 2019-12-04 ENCOUNTER — Encounter: Payer: Self-pay | Admitting: Physician Assistant

## 2019-12-04 DIAGNOSIS — F3341 Major depressive disorder, recurrent, in partial remission: Secondary | ICD-10-CM | POA: Diagnosis not present

## 2019-12-04 DIAGNOSIS — F3281 Premenstrual dysphoric disorder: Secondary | ICD-10-CM

## 2019-12-04 DIAGNOSIS — G2581 Restless legs syndrome: Secondary | ICD-10-CM

## 2019-12-04 DIAGNOSIS — F411 Generalized anxiety disorder: Secondary | ICD-10-CM | POA: Diagnosis not present

## 2019-12-04 NOTE — Progress Notes (Signed)
[facility]  Patient ID: Traci [name],  MRN: [id number]  PCP: Algis Greenhouse, MD  Date of Evaluation: 12/04/2019 Time spent:20 minutes  Chief Complaint:  Chief Complaint    Follow-up      HISTORY/CURRENT STATUS: HPI for routine follow-up.  Her husband Traci [name] is with her.  Doing much better since increasing the Lamictal last month. Is more able to enjoy things, energy and motivation are good. Not crying easily, sleeping ok. No SI/HI.  Patient denies increased energy with decreased need for sleep, no increased talkativeness, no racing thoughts, no impulsivity or risky behaviors, no increased spending, no increased libido, no grandiosity, no increased irritability or anger, and no hallucinations.  Anxiety is pretty well-controlled. Klonopin does help a lot. Work is going ok. Sleeps well as long as the ropinerole helps the RLS.  Denies dizziness, syncope, seizures, numbness, tingling, tremor, tics, unsteady gait, slurred speech, confusion. Denies muscle or joint pain, stiffness, or dystonia.  Individual Medical History/ Review of Systems: Changes? :No    Past medications for mental health diagnoses include: Zoloft, Xanax, Wellbutrin, Lexapro, Effexor, Luvox, Cymbaltacaused more anxiety then depression  Allergies: Wellbutrin [bupropion] and Adhesive [tape]  Current Medications:  Current Outpatient Medications:  .  acetaminophen (TYLENOL) 325 MG tablet, Take 650 mg by mouth every 6 (six) hours as needed., Disp: , Rfl:  .  azaTHIOprine (IMURAN) 50 MG tablet, Take 125 mg by mouth daily., Disp: , Rfl:  .  clonazePAM (KLONOPIN) 0.5 MG tablet, TAKE 1/2- 1 TABLET BY MOUTH three times DAILY AS NEEDED, Disp: 270 tablet, Rfl: 0 .  cyanocobalamin (,VITAMIN B-12,) 1000 MCG/ML injection, Inject 1,000 mcg into the muscle every 30 (thirty) days., Disp: , Rfl:  .  ferrous sulfate 325 (65 FE) MG tablet, Take by mouth., Disp: , Rfl:  .  folic acid (FOLVITE) 1 MG tablet, Take 1 mg  by mouth daily., Disp: , Rfl:  .  ibuprofen (ADVIL) 200 MG tablet, Take 200 mg by mouth every 6 (six) hours as needed., Disp: , Rfl:  .  inFLIXimab (REMICADE IV), Inject into the vein., Disp: , Rfl:  .  lamoTRIgine (LAMICTAL) 150 MG tablet, Take 1 tablet (150 mg total) by mouth 2 (two) times daily., Disp: 180 tablet, Rfl: 0 .  loratadine (CLARITIN) 10 MG tablet, Take 10 mg by mouth daily., Disp: , Rfl:  .  Melatonin 10 MG TABS, Take 10 mg by mouth., Disp: , Rfl:  .  montelukast (SINGULAIR) 10 MG tablet, Take 10 mg by mouth at bedtime., Disp: , Rfl:  .  omeprazole (PRILOSEC) 20 MG capsule, Take 20 mg by mouth daily., Disp: , Rfl:  .  rOPINIRole (REQUIP) 4 MG tablet, TAKE 2 TABLETS(8 MG) BY MOUTH AT BEDTIME, Disp: 180 tablet, Rfl: 1 .  sertraline (ZOLOFT) 100 MG tablet, Take 2 tablets (200 mg total) by mouth daily. With increase to 273m before menses., Disp: 200 tablet, Rfl: 1 .  Vitamin D, Ergocalciferol, (DRISDOL) 1.25 MG (50000 UNIT) CAPS capsule, Take 1 capsule (50,000 Units total) by mouth every 7 (seven) days. (Patient not taking: Reported on 10/22/2019), Disp: 12 capsule, Rfl: 0 Medication Side Effects: none  Family Medical/ Social History: Changes? Getting their new puppy, Gus, tomorrow.   MENTAL HEALTH EXAM:  There were no vitals taken for this visit.There is no height or weight on file to calculate BMI.  General Appearance: Casual, Neat, Well Groomed and Obese  Eye Contact:  Good  Speech:  Clear and Coherent and Normal Rate  Volume:  Normal  Mood:  Euthymic  Affect:  Appropriate  Thought Process:  Goal Directed and Descriptions of Associations: Intact  Orientation:  Full (Time, Place, and Person)  Thought Content: Logical   Suicidal Thoughts:  No  Homicidal Thoughts:  No  Memory:  WNL  Judgement:  Good  Insight:  Good  Psychomotor Activity:  Normal  Concentration:  Concentration: Good and Attention Span: Good  Recall:  Good  Fund of Knowledge: Good  Language: Good   Assets:  Desire for Improvement  ADL's:  Intact  Cognition: WNL  Prognosis:  Good    DIAGNOSES:    ICD-10-CM   1. Recurrent major depressive disorder, in partial remission ([place])  F33.41   2. Restless leg syndrome  G25.81   3. Generalized anxiety disorder  F41.1   4. PMDD (premenstrual dysphoric disorder)  F32.81     Receiving Psychotherapy: No    RECOMMENDATIONS:  PDMP was reviewed. Continue Lamictal 150 mg, 1 p.o. twice daily. Continue ropinirole 4 mg, 2 p.o. nightly. Continue Klonopin 0.5 mg, 1/2-1 3 times daily as needed. Continue Zoloft 100 mg, 2 p.o. q. daily with increase to 250 mg daily before menses, then back to 200 mg total. Return in 3 months  [name], PA-C

## 2020-01-25 ENCOUNTER — Other Ambulatory Visit: Payer: Self-pay | Admitting: Physician Assistant

## 2020-01-26 NOTE — Telephone Encounter (Signed)
review 

## 2020-02-18 ENCOUNTER — Telehealth: Payer: Self-pay | Admitting: Physician Assistant

## 2020-02-18 NOTE — Telephone Encounter (Signed)
Pt called requesting Klonopin. There are no refills. [facility] on [address]

## 2020-02-19 ENCOUNTER — Telehealth: Payer: Self-pay | Admitting: Physician Assistant

## 2020-02-19 ENCOUNTER — Other Ambulatory Visit: Payer: Self-pay

## 2020-02-19 MED ORDER — CLONAZEPAM 0.5 MG PO TABS: ORAL_TABLET | ORAL | 0 refills | Status: DC

## 2020-02-19 NOTE — Telephone Encounter (Signed)
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

## 2020-02-19 NOTE — Telephone Encounter (Signed)
Prescription was sent

## 2020-02-19 NOTE — Telephone Encounter (Signed)
No I haven't

## 2020-02-19 NOTE — Telephone Encounter (Signed)
Have you received a refill request for her?

## 2020-02-19 NOTE — Telephone Encounter (Signed)
Tammy I did not receive a refill request from her? Do you know where it was sent?

## 2020-02-19 NOTE — Telephone Encounter (Signed)
I'll send in the Rx once the system is back up.

## 2020-02-19 NOTE — Telephone Encounter (Signed)
Pt called to update on call left yesterday. Apt 10/29.  Leaving Saturday 10[place]16 for beach and will be there all next week. Have enough Clonazepam until mid next week. If system issue not resolved today, Pt advised ok to call in at beach location if we advise Pt will give pharmacy details at beach location. Work # [phone] Cell # [phone]

## 2020-03-04 ENCOUNTER — Encounter: Payer: Self-pay | Admitting: Physician Assistant

## 2020-03-04 ENCOUNTER — Telehealth (INDEPENDENT_AMBULATORY_CARE_PROVIDER_SITE_OTHER): Payer: BC Managed Care – PPO | Admitting: Physician Assistant

## 2020-03-04 DIAGNOSIS — F411 Generalized anxiety disorder: Secondary | ICD-10-CM

## 2020-03-04 DIAGNOSIS — F3281 Premenstrual dysphoric disorder: Secondary | ICD-10-CM | POA: Diagnosis not present

## 2020-03-04 DIAGNOSIS — F3341 Major depressive disorder, recurrent, in partial remission: Secondary | ICD-10-CM

## 2020-03-04 DIAGNOSIS — G2581 Restless legs syndrome: Secondary | ICD-10-CM

## 2020-03-04 MED ORDER — SERTRALINE HCL 100 MG PO TABS: 200.0000 mg | ORAL_TABLET | Freq: Every day | ORAL | 1 refills | Status: DC

## 2020-03-04 NOTE — Progress Notes (Signed)
[facility]  Patient ID: Traci [name],  MRN: [id number]  PCP: Algis Greenhouse, MD  Date of Evaluation: 03/04/2020 Time spent:20 minutes  Chief Complaint:  Chief Complaint    Anxiety; Depression     Virtual Visit via Telehealth  I connected with patient by a video enabled telemedicine application, with their informed consent, and verified patient privacy and that I am speaking with the correct person using two identifiers.  I am private, in my office and the patient is in her car.   I discussed the limitations, risks, security and privacy concerns of performing an evaluation and management service by video and the availability of in person appointments. I also discussed with the patient that there may be a patient responsible charge related to this service. The patient expressed understanding and agreed to proceed.   I discussed the assessment and treatment plan with the patient. The patient was provided an opportunity to ask questions and all were answered. The patient agreed with the plan and demonstrated an understanding of the instructions.   The patient was advised to call back or seek an in-person evaluation if the symptoms worsen or if the condition fails to improve as anticipated.  I provided 20  minutes of non-face-to-face time during this encounter.  HISTORY/CURRENT STATUS: HPI for routine follow-up.    Is doing well. Feels like medicines are working like they should. Is more able to enjoy things, energy and motivation are good. Not crying easily, sleeping ok. No SI/HI.  The Zoloft increase before menses sometimes helps, 'maybe.' She doesn't always know when menses will begin, so she can increase the Zoloft. "It's tolerable though."  RLS is well controlled with the Ropinerole.   Patient denies increased energy with decreased need for sleep, no increased talkativeness, no racing thoughts, no impulsivity or risky behaviors, no increased spending, no  increased libido, no grandiosity, no increased irritability or anger, no paranoia, and no hallucinations.  Anxiety is pretty well-controlled. Klonopin does help a lot. Work is going ok. Sleeps well as long as the ropinerole helps the RLS.  Denies dizziness, syncope, seizures, numbness, tingling, tremor, tics, unsteady gait, slurred speech, confusion. Denies muscle or joint pain, stiffness, or dystonia.  Individual Medical History/ Review of Systems: Changes? :Yes    Is going to be worked up for OSA soon.  Has had infection around tooth that had spread into maxiallary sinus and had endodontal surgery yesterday.   Past medications for mental health diagnoses include: Zoloft, Xanax, Wellbutrin, Lexapro, Effexor, Luvox, Cymbaltacaused more anxiety then depression  Allergies: Wellbutrin [bupropion] and Adhesive [tape]  Current Medications:  Current Outpatient Medications:  .  acetaminophen (TYLENOL) 325 MG tablet, Take 650 mg by mouth every 6 (six) hours as needed., Disp: , Rfl:  .  amoxicillin (AMOXIL) 500 MG capsule, Take by mouth., Disp: , Rfl:  .  azaTHIOprine (IMURAN) 50 MG tablet, Take 125 mg by mouth daily., Disp: , Rfl:  .  clonazePAM (KLONOPIN) 0.5 MG tablet, TAKE 1/2- 1 TABLET BY MOUTH three times DAILY AS NEEDED, Disp: 270 tablet, Rfl: 0 .  cyanocobalamin (,VITAMIN B-12,) 1000 MCG/ML injection, Inject 1,000 mcg into the muscle every 30 (thirty) days., Disp: , Rfl:  .  ferrous sulfate 325 (65 FE) MG tablet, Take by mouth., Disp: , Rfl:  .  folic acid (FOLVITE) 1 MG tablet, Take 1 mg by mouth daily., Disp: , Rfl:  .  ibuprofen (ADVIL) 200 MG tablet, Take 200 mg by mouth every 6 (six) hours  as needed., Disp: , Rfl:  .  inFLIXimab (REMICADE IV), Inject into the vein., Disp: , Rfl:  .  lamoTRIgine (LAMICTAL) 150 MG tablet, TAKE 1 TABLET(150 MG) BY MOUTH TWICE DAILY, Disp: 180 tablet, Rfl: 0 .  loratadine (CLARITIN) 10 MG tablet, Take 10 mg by mouth daily., Disp: , Rfl:  .  Melatonin 10  MG TABS, Take 10 mg by mouth., Disp: , Rfl:  .  montelukast (SINGULAIR) 10 MG tablet, Take 10 mg by mouth at bedtime., Disp: , Rfl:  .  omeprazole (PRILOSEC) 20 MG capsule, Take 20 mg by mouth daily., Disp: , Rfl:  .  rOPINIRole (REQUIP) 4 MG tablet, TAKE 2 TABLETS(8 MG) BY MOUTH AT BEDTIME, Disp: 180 tablet, Rfl: 1 .  sertraline (ZOLOFT) 100 MG tablet, Take 2 tablets (200 mg total) by mouth daily. With increase to 256m before menses., Disp: 200 tablet, Rfl: 1 .  Vitamin D, Ergocalciferol, (DRISDOL) 1.25 MG (50000 UNIT) CAPS capsule, Take 1 capsule (50,000 Units total) by mouth every 7 (seven) days. (Patient not taking: Reported on 10/22/2019), Disp: 12 capsule, Rfl: 0 Medication Side Effects: none  Family Medical/ Social History: Changes? No   MENTAL HEALTH EXAM:  There were no vitals taken for this visit.There is no height or weight on file to calculate BMI.  General Appearance: Casual, Neat, Well Groomed and Obese  Eye Contact:  Good  Speech:  Clear and Coherent and Normal Rate  Volume:  Normal  Mood:  Euthymic  Affect:  Appropriate  Thought Process:  Goal Directed and Descriptions of Associations: Intact  Orientation:  Full (Time, Place, and Person)  Thought Content: Logical   Suicidal Thoughts:  No  Homicidal Thoughts:  No  Memory:  WNL  Judgement:  Good  Insight:  Good  Psychomotor Activity:  Normal  Concentration:  Concentration: Good and Attention Span: Good  Recall:  Good  Fund of Knowledge: Good  Language: Good  Assets:  Desire for Improvement  ADL's:  Intact  Cognition: WNL  Prognosis:  Good    DIAGNOSES:    ICD-10-CM   1. Recurrent major depressive disorder, in partial remission ([place]  F33.41   2. Generalized anxiety disorder  F41.1   3. Restless leg syndrome  G25.81   4. PMDD (premenstrual dysphoric disorder)  F32.81     Receiving Psychotherapy: No    RECOMMENDATIONS:  PDMP was reviewed. I provided 20 minutes of non face to face time during this  encounter.  Continue Lamictal 150 mg, 1 p.o. twice daily. Continue ropinirole 4 mg, 2 p.o. nightly. Continue Klonopin 0.5 mg, 1/2-1 3 times daily as needed. Continue Zoloft 100 mg, 2 p.o. q. daily with increase to 250 mg daily before menses, then back to 200 mg total. Return in 3 months  TDonnal Moat PA-C

## 2020-03-06 ENCOUNTER — Telehealth: Payer: Self-pay | Admitting: Physician Assistant

## 2020-03-06 NOTE — Telephone Encounter (Signed)
[name], mohar are scheduled for a virtual visit with your provider today.    Just as we do with appointments in the office, we must obtain your consent to participate.  Your consent will be active for this visit and any virtual visit you may have with one of our providers in the next 365 days.    If you have a MyChart account, I can also send a copy of this consent to you electronically.  All virtual visits are billed to your insurance company just like a traditional visit in the office.  As this is a virtual visit, video technology does not allow for your provider to perform a traditional examination.  This may limit your provider's ability to fully assess your condition.  If your provider identifies any concerns that need to be evaluated in person or the need to arrange testing such as labs, EKG, etc, we will make arrangements to do so.    Although advances in technology are sophisticated, we cannot ensure that it will always work on either your end or our end.  If the connection with a video visit is poor, we may have to switch to a telephone visit.  With either a video or telephone visit, we are not always able to ensure that we have a secure connection.   I need to obtain your verbal consent now.   Are you willing to proceed with your visit today?   Traci [name] has provided verbal consent on 03/06/2020 for a virtual visit (video or telephone).   [name], PA-C 03/06/2020  10:13 PM

## 2020-04-16 ENCOUNTER — Other Ambulatory Visit: Payer: Self-pay | Admitting: Physician Assistant

## 2020-06-01 ENCOUNTER — Ambulatory Visit (INDEPENDENT_AMBULATORY_CARE_PROVIDER_SITE_OTHER): Payer: BC Managed Care – PPO | Admitting: Physician Assistant

## 2020-06-01 ENCOUNTER — Other Ambulatory Visit: Payer: Self-pay

## 2020-06-01 ENCOUNTER — Encounter: Payer: Self-pay | Admitting: Physician Assistant

## 2020-06-01 DIAGNOSIS — F3341 Major depressive disorder, recurrent, in partial remission: Secondary | ICD-10-CM | POA: Diagnosis not present

## 2020-06-01 DIAGNOSIS — G2581 Restless legs syndrome: Secondary | ICD-10-CM

## 2020-06-01 DIAGNOSIS — Z3009 Encounter for other general counseling and advice on contraception: Secondary | ICD-10-CM | POA: Diagnosis not present

## 2020-06-01 DIAGNOSIS — F411 Generalized anxiety disorder: Secondary | ICD-10-CM | POA: Diagnosis not present

## 2020-06-01 MED ORDER — CLONAZEPAM 0.5 MG PO TABS: ORAL_TABLET | ORAL | 1 refills | Status: DC

## 2020-06-01 NOTE — Progress Notes (Signed)
[facility]  Patient ID: Fartun Paradiso,  MRN: [id number]  PCP: Algis Greenhouse, MD  Date of Evaluation: 06/01/2020 Time spent:30 minutes  Chief Complaint:  Chief Complaint    Anxiety; Depression      HISTORY/CURRENT STATUS: HPI for routine follow-up.    Doing pretty well overall. Meds are working well. Able to enjoy things, energy and motivation are good, for the most part.  Work is going fine.  She does not isolate.  Not crying easily.  No suicidal or homicidal thoughts.  She and her husband are ill trying to decide if they want to have a child or not.  [name] is concerned she would have to get off of most of her medications that are helping her mental and physical illnesses.  States that the a lot to think about.  RLS is well controlled with the Ropinerole. And FeSO4 which has helped a lot.   Patient denies increased energy with decreased need for sleep, no increased talkativeness, no racing thoughts, no impulsivity or risky behaviors, no increased spending, no increased libido, no grandiosity, no increased irritability or anger, no paranoia, and no hallucinations.  Anxiety is pretty well-controlled. Klonopin does help a lot. Work is going ok. Sleeps well as long as the ropinerole helps the RLS.  Denies dizziness, syncope, seizures, numbness, tingling, tremor, tics, unsteady gait, slurred speech, confusion. Denies muscle or joint pain, stiffness, or dystonia.  Individual Medical History/ Review of Systems: Changes? :Yes      Past medications for mental health diagnoses include: Zoloft, Xanax, Wellbutrin, Lexapro, Effexor, Luvox, Cymbaltacaused more anxiety then depression  Allergies: Wellbutrin [bupropion] and Adhesive [tape]  Current Medications:  Current Outpatient Medications:  .  acetaminophen (TYLENOL) 325 MG tablet, Take 650 mg by mouth every 6 (six) hours as needed., Disp: , Rfl:  .  azaTHIOprine (IMURAN) 50 MG tablet, Take 125 mg by mouth daily., Disp:  , Rfl:  .  cyanocobalamin (,VITAMIN B-12,) 1000 MCG/ML injection, Inject 1,000 mcg into the muscle every 30 (thirty) days., Disp: , Rfl:  .  ferrous sulfate 325 (65 FE) MG tablet, Take by mouth., Disp: , Rfl:  .  folic acid (FOLVITE) 1 MG tablet, Take 1 mg by mouth daily., Disp: , Rfl:  .  ibuprofen (ADVIL) 200 MG tablet, Take 200 mg by mouth every 6 (six) hours as needed., Disp: , Rfl:  .  inFLIXimab (REMICADE IV), Inject into the vein., Disp: , Rfl:  .  lamoTRIgine (LAMICTAL) 150 MG tablet, TAKE 1 TABLET(150 MG) BY MOUTH TWICE DAILY, Disp: 180 tablet, Rfl: 1 .  loratadine (CLARITIN) 10 MG tablet, Take 10 mg by mouth daily., Disp: , Rfl:  .  Melatonin 10 MG TABS, Take 10 mg by mouth., Disp: , Rfl:  .  montelukast (SINGULAIR) 10 MG tablet, Take 10 mg by mouth at bedtime., Disp: , Rfl:  .  omeprazole (PRILOSEC) 20 MG capsule, Take 40 mg by mouth daily., Disp: , Rfl:  .  rOPINIRole (REQUIP) 4 MG tablet, TAKE 2 TABLETS(8 MG) BY MOUTH AT BEDTIME, Disp: 180 tablet, Rfl: 1 .  sertraline (ZOLOFT) 100 MG tablet, Take 2 tablets (200 mg total) by mouth daily. With increase to 215m before menses., Disp: 200 tablet, Rfl: 1 .  amoxicillin (AMOXIL) 500 MG capsule, Take by mouth. (Patient not taking: Reported on 06/01/2020), Disp: , Rfl:  .  clonazePAM (KLONOPIN) 0.5 MG tablet, TAKE 1/2- 1 TABLET BY MOUTH three times DAILY AS NEEDED, Disp: 270 tablet, Rfl: 1 .  Vitamin D, Ergocalciferol, (DRISDOL) 1.25 MG (50000 UNIT) CAPS capsule, Take 1 capsule (50,000 Units total) by mouth every 7 (seven) days. (Patient not taking: No sig reported), Disp: 12 capsule, Rfl: 0 Medication Side Effects: none  Family Medical/ Social History: Changes? No   MENTAL HEALTH EXAM:  There were no vitals taken for this visit.There is no height or weight on file to calculate BMI.  General Appearance: Casual, Neat, Well Groomed and Obese  Eye Contact:  Good  Speech:  Clear and Coherent and Normal Rate  Volume:  Normal  Mood:   Euthymic  Affect:  Appropriate  Thought Process:  Goal Directed and Descriptions of Associations: Intact  Orientation:  Full (Time, Place, and Person)  Thought Content: Logical   Suicidal Thoughts:  No  Homicidal Thoughts:  No  Memory:  WNL  Judgement:  Good  Insight:  Good  Psychomotor Activity:  Normal  Concentration:  Concentration: Good and Attention Span: Good  Recall:  Good  Fund of Knowledge: Good  Language: Good  Assets:  Desire for Improvement  ADL's:  Intact  Cognition: WNL  Prognosis:  Good    DIAGNOSES:    ICD-10-CM   1. Recurrent major depressive disorder, in partial remission ([place])  F33.41   2. Generalized anxiety disorder  F41.1   3. Restless leg syndrome  G25.81   4. Family planning advice  Z30.09     Receiving Psychotherapy: No     RECOMMENDATIONS:  PDMP was reviewed. I provided 30 minutes of face to face time during this encounter, in which we discussed her psychiatric medications and pregnancy.  The one that would be worrisome is the ropinirole.  She would like to go ahead and start weaning down if possible just to see if she would be able to if they start actively trying to conceive.  I told her that Lamictal and Zoloft are usually just fine in pregnancy.  Millions of women have been pregnant on these medications with no increased risks of birth defects than women who are not on these medications.  The Klonopin may be very different and that would be limited to extreme emergencies.  She verbalizes understanding and would like to go ahead and try decreasing the dose on the ropinirole. Continue Lamictal 150 mg, 1 p.o. twice daily. Decrease ropinirole to a total of 6 mg for 2 weeks.  If tolerates it well, go down to 4 mg.  Will stay at that.  She can call if questions.   Continue Klonopin 0.5 mg, 1/2-1 3 times daily as needed. Continue Zoloft 100 mg, 2 p.o. q. daily with increase to 250 mg daily before menses, then back to 200 mg total. Return in 3  months  [name], PA-C

## 2020-08-31 ENCOUNTER — Ambulatory Visit (INDEPENDENT_AMBULATORY_CARE_PROVIDER_SITE_OTHER): Payer: BC Managed Care – PPO | Admitting: Physician Assistant

## 2020-08-31 ENCOUNTER — Other Ambulatory Visit: Payer: Self-pay

## 2020-08-31 ENCOUNTER — Encounter: Payer: Self-pay | Admitting: Physician Assistant

## 2020-08-31 DIAGNOSIS — F3341 Major depressive disorder, recurrent, in partial remission: Secondary | ICD-10-CM | POA: Diagnosis not present

## 2020-08-31 DIAGNOSIS — F411 Generalized anxiety disorder: Secondary | ICD-10-CM

## 2020-08-31 DIAGNOSIS — G2581 Restless legs syndrome: Secondary | ICD-10-CM

## 2020-08-31 NOTE — Progress Notes (Signed)
[facility]  Patient ID: Traci [name],  MRN: [id number]  PCP: London Pepper, MD  Date of Evaluation: 08/31/2020 Time spent:20 minutes  Chief Complaint:  Chief Complaint    Anxiety; Depression; Follow-up      HISTORY/CURRENT STATUS: HPI For routine med check. Husband [name] is with her.  Three months ago, we started weaning the Ropinerole, b/c might be trying to conceive. Has been going down really slow and tolerating it ok. They are still not sure they want to have a child but this change is in preparation for that if so. The RLS is a little bit of a problem now but tolerable and certainly not bad enough to increase the dose of Ropinerole.  Anxiety is well-controlled. Klonopin is still very effective.   Patient denies loss of interest in usual activities and is able to enjoy things.  Denies decreased energy or motivation.  Appetite has not changed.  No extreme sadness, tearfulness, or feelings of hopelessness.  Denies any changes in concentration, making decisions or remembering things.  Sleeps well. Denies suicidal or homicidal thoughts.  Patient denies increased energy with decreased need for sleep, no increased talkativeness, no racing thoughts, no impulsivity or risky behaviors, no increased spending, no increased libido, no grandiosity, no increased irritability or anger, and no hallucinations.  Denies dizziness, syncope, seizures, numbness, tingling, tremor, tics, unsteady gait, slurred speech, confusion. Denies muscle or joint pain, stiffness, or dystonia.  Individual Medical History/ Review of Systems: Changes? :Yes  fibroids and thickening of endometrial lining. Having further work up soon.  Past medications for mental health diagnoses include: Zoloft, Xanax, Wellbutrin, Lexapro, Effexor, Luvox, Cymbaltacaused more anxiety then depression  Allergies: Wellbutrin [bupropion] and Adhesive [tape]  Current Medications:  Current Outpatient Medications:  .   azaTHIOprine (IMURAN) 50 MG tablet, Take 125 mg by mouth daily., Disp: , Rfl:  .  clonazePAM (KLONOPIN) 0.5 MG tablet, TAKE 1/2- 1 TABLET BY MOUTH three times DAILY AS NEEDED, Disp: 270 tablet, Rfl: 1 .  cyanocobalamin (,VITAMIN B-12,) 1000 MCG/ML injection, Inject 1,000 mcg into the muscle every 30 (thirty) days., Disp: , Rfl:  .  ferrous sulfate 325 (65 FE) MG tablet, Take by mouth., Disp: , Rfl:  .  folic acid (FOLVITE) 1 MG tablet, Take 1 mg by mouth daily., Disp: , Rfl:  .  inFLIXimab (REMICADE IV), Inject into the vein., Disp: , Rfl:  .  lamoTRIgine (LAMICTAL) 150 MG tablet, TAKE 1 TABLET(150 MG) BY MOUTH TWICE DAILY, Disp: 180 tablet, Rfl: 1 .  loratadine (CLARITIN) 10 MG tablet, Take 10 mg by mouth daily., Disp: , Rfl:  .  Melatonin 10 MG TABS, Take 10 mg by mouth., Disp: , Rfl:  .  montelukast (SINGULAIR) 10 MG tablet, Take 10 mg by mouth at bedtime., Disp: , Rfl:  .  omeprazole (PRILOSEC) 20 MG capsule, Take 40 mg by mouth daily., Disp: , Rfl:  .  rOPINIRole (REQUIP) 4 MG tablet, TAKE 2 TABLETS(8 MG) BY MOUTH AT BEDTIME (Patient taking differently: 5 mg.), Disp: 180 tablet, Rfl: 1 .  sertraline (ZOLOFT) 100 MG tablet, Take 2 tablets (200 mg total) by mouth daily. With increase to 264m before menses., Disp: 200 tablet, Rfl: 1 .  acetaminophen (TYLENOL) 325 MG tablet, Take 650 mg by mouth every 6 (six) hours as needed. (Patient not taking: Reported on 08/31/2020), Disp: , Rfl:  .  amoxicillin (AMOXIL) 500 MG capsule, Take by mouth. (Patient not taking: No sig reported), Disp: , Rfl:  .  ibuprofen (  ADVIL) 200 MG tablet, Take 200 mg by mouth every 6 (six) hours as needed. (Patient not taking: Reported on 08/31/2020), Disp: , Rfl:  .  Vitamin D, Ergocalciferol, (DRISDOL) 1.25 MG (50000 UNIT) CAPS capsule, Take 1 capsule (50,000 Units total) by mouth every 7 (seven) days. (Patient not taking: No sig reported), Disp: 12 capsule, Rfl: 0 Medication Side Effects: none  Family Medical/ Social  History: Changes? No  MENTAL HEALTH EXAM:  There were no vitals taken for this visit.There is no height or weight on file to calculate BMI.  General Appearance: Casual, Well Groomed and Obese  Eye Contact:  Good  Speech:  Clear and Coherent and Normal Rate  Volume:  Normal  Mood:  Euthymic  Affect:  Appropriate  Thought Process:  Goal Directed and Descriptions of Associations: Circumstantial  Orientation:  Full (Time, Place, and Person)  Thought Content: Logical   Suicidal Thoughts:  No  Homicidal Thoughts:  No  Memory:  WNL  Judgement:  Good  Insight:  Good  Psychomotor Activity:  Normal  Concentration:  Concentration: Good  Recall:  Good  Fund of Knowledge: Good  Language: Good  Assets:  Desire for Improvement  ADL's:  Intact  Cognition: WNL  Prognosis:  Good    DIAGNOSES:    ICD-10-CM   1. Recurrent major depressive disorder, in partial remission ([place])  F33.41   2. Generalized anxiety disorder  F41.1   3. Restless leg syndrome  G25.81     Receiving Psychotherapy: No    RECOMMENDATIONS:  PDMP reviewed. I provided 20 mins of face to face time during this encounter, including time spent in records review and charting.  She's doing well with slow wean of Ropinerole so will cont to wean as tolerated. Continue Ropinerole total of 5 mg q evening for 1 month and then decrease by 1 mg for 1 month or.' Continue Klonopin 0.5 mg, 1/2-1 tid prn Continue Lamictal 150 mg, 1 po bid. Cont Zoloft 100 mg, 2 qd. Continue MVI, Vit D, B Complex. Return in 3 months,  [name], [place]

## 2020-09-22 ENCOUNTER — Other Ambulatory Visit: Payer: Self-pay

## 2020-09-22 ENCOUNTER — Ambulatory Visit: Payer: BC Managed Care – PPO | Admitting: Podiatry

## 2020-09-22 ENCOUNTER — Encounter: Payer: Self-pay | Admitting: Podiatry

## 2020-09-22 DIAGNOSIS — B351 Tinea unguium: Secondary | ICD-10-CM | POA: Diagnosis not present

## 2020-09-22 MED ORDER — TERBINAFINE HCL 250 MG PO TABS: ORAL_TABLET | ORAL | 0 refills | Status: DC

## 2020-09-22 NOTE — Progress Notes (Signed)
Subjective:   Patient ID: Traci [name], female   DOB: 40 y.o.   MRN: [id number]   HPI Patient presents stating she has had a lot of discoloration in her left big toenail and its been thick and going on for approximately a year.  Does not remember specific injury but did wear some tight boots last year.  Patient does not smoke likes to be active   Review of Systems  All other systems reviewed and are negative.       Objective:  Physical Exam Vitals and nursing note reviewed.  Constitutional:      Appearance: She is well-developed.  Pulmonary:     Effort: Pulmonary effort is normal.  Musculoskeletal:        General: Normal range of motion.  Skin:    General: Skin is warm.  Neurological:     Mental Status: She is alert.     Neurovascular status found to be intact muscle strength was found to be adequate range of motion was found to be adequate.  Patient's left hallux nail distal two thirds is yellow brittle and has some thickness to it.  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  At this point going to place her on a Lamisil pulse therapy 1 pill for a day for a week monthly for 4 months and we will initiate laser of the left hallux nail.  I do feel this will help her but cannot give her any guarantees

## 2020-09-30 ENCOUNTER — Other Ambulatory Visit: Payer: BC Managed Care – PPO

## 2020-10-06 ENCOUNTER — Other Ambulatory Visit: Payer: Self-pay | Admitting: Physician Assistant

## 2020-10-22 ENCOUNTER — Other Ambulatory Visit: Payer: Self-pay | Admitting: Physician Assistant

## 2020-10-24 ENCOUNTER — Other Ambulatory Visit: Payer: Self-pay | Admitting: Physician Assistant

## 2020-10-26 ENCOUNTER — Ambulatory Visit: Payer: BC Managed Care – PPO | Admitting: Neurology

## 2020-11-26 ENCOUNTER — Encounter (HOSPITAL_BASED_OUTPATIENT_CLINIC_OR_DEPARTMENT_OTHER): Payer: Self-pay

## 2020-11-26 ENCOUNTER — Emergency Department (HOSPITAL_BASED_OUTPATIENT_CLINIC_OR_DEPARTMENT_OTHER)
Admission: EM | Admit: 2020-11-26 | Discharge: 2020-11-26 | Disposition: A | Discharge status: Home / Self Care | Payer: BC Managed Care – PPO | Attending: Emergency Medicine | Admitting: Emergency Medicine

## 2020-11-26 ENCOUNTER — Emergency Department (HOSPITAL_BASED_OUTPATIENT_CLINIC_OR_DEPARTMENT_OTHER): Payer: BC Managed Care – PPO

## 2020-11-26 ENCOUNTER — Other Ambulatory Visit: Payer: Self-pay

## 2020-11-26 DIAGNOSIS — Z79899 Other long term (current) drug therapy: Secondary | ICD-10-CM | POA: Diagnosis not present

## 2020-11-26 DIAGNOSIS — N23 Unspecified renal colic: Secondary | ICD-10-CM | POA: Diagnosis not present

## 2020-11-26 DIAGNOSIS — N200 Calculus of kidney: Secondary | ICD-10-CM | POA: Diagnosis not present

## 2020-11-26 DIAGNOSIS — R109 Unspecified abdominal pain: Secondary | ICD-10-CM | POA: Diagnosis present

## 2020-11-26 LAB — URINALYSIS, ROUTINE W REFLEX MICROSCOPIC
Bilirubin Urine: NEGATIVE
Glucose, UA: NEGATIVE mg/dL
Ketones, ur: NEGATIVE mg/dL
Leukocytes,Ua: NEGATIVE
Nitrite: NEGATIVE
Protein, ur: NEGATIVE mg/dL
Specific Gravity, Urine: >1.03 — ABNORMAL HIGH (ref 1.005–1.030)
pH: 5.5 (ref 5.0–8.0)

## 2020-11-26 LAB — URINALYSIS, MICROSCOPIC (REFLEX)

## 2020-11-26 LAB — PREGNANCY, URINE: Preg Test, Ur: NEGATIVE

## 2020-11-26 MED ORDER — HYDROMORPHONE HCL 1 MG/ML IJ SOLN
1.0000 mg | Freq: Once | INTRAMUSCULAR | Status: AC
  Administered 2020-11-26: 0.5 mg via INTRAVENOUS
  Filled 2020-11-26: qty 1

## 2020-11-26 MED ORDER — KETOROLAC TROMETHAMINE 30 MG/ML IJ SOLN
15.0000 mg | Freq: Once | INTRAMUSCULAR | Status: AC
Start: 2020-11-26 — End: 2020-11-26
  Administered 2020-11-26: 15 mg via INTRAVENOUS
  Filled 2020-11-26: qty 1

## 2020-11-26 MED ORDER — OXYCODONE-ACETAMINOPHEN 5-325 MG PO TABS: 1.0000 | ORAL_TABLET | ORAL | 0 refills | Status: DC | PRN

## 2020-11-26 MED ORDER — OXYCODONE-ACETAMINOPHEN 5-325 MG PO TABS
1.0000 | ORAL_TABLET | Freq: Once | ORAL | Status: AC
Start: 2020-11-26 — End: 2020-11-26
  Administered 2020-11-26: 1 via ORAL
  Filled 2020-11-26: qty 1

## 2020-11-26 MED ORDER — ONDANSETRON HCL 4 MG/2ML IJ SOLN
4.0000 mg | Freq: Once | INTRAMUSCULAR | Status: AC
  Administered 2020-11-26: 4 mg via INTRAVENOUS
  Filled 2020-11-26: qty 2

## 2020-11-26 MED ORDER — ONDANSETRON 4 MG PO TBDP: 4.0000 mg | ORAL_TABLET | Freq: Three times a day (TID) | ORAL | 0 refills | Status: DC | PRN

## 2020-11-26 NOTE — ED Notes (Signed)
Patient transported to X-ray. Will get vitals when she returns

## 2020-11-26 NOTE — Discharge Instructions (Addendum)
Follow up with [facility] for further evaluation of right flank pain felt caused by stones. Take medications as prescribed.   If you develop any fever, have uncontrolled pain or vomiting, return to the emergency department immediately for further treatment.

## 2020-11-26 NOTE — ED Provider Notes (Signed)
[facility] EMERGENCY DEPARTMENT Provider Note   CSN: [id number] Arrival date & time: 11/26/20  1434     History Chief Complaint  Patient presents with   Flank Pain    Traci [name] is a 40 y.o. female.  Patient to ED with right flank pain that started earlier in the day and continues to get progressively worse. Associated with nausea, no vomiting or fever. Has had kidney stones on previous CT's done for evaluation of Crohn's but never had a passed ureteral stone that she is aware of. The pain stays in the flank area without radiation to the abdomen. She reports currently menstruating but does not feel she has hematuria.   The history is provided by the patient. No language interpreter was used.  Flank Pain Pertinent negatives include no chest pain, no abdominal pain and no shortness of breath.      Past Medical History:  Diagnosis Date   Anemia    Anxiety    Blood clot in vein    Right lower leg after surgery   Crohn's disease (HCC)    Depression    Dyspnea    Elevated BP without diagnosis of hypertension    Enlarged liver    GERD (gastroesophageal reflux disease)    IBS (irritable bowel syndrome)    Laceration of left little finger 02/28/2018   Lower extremity edema    Restless legs    Seasonal allergies    Uterine fibroid    Vitamin B 12 deficiency    Vitamin D deficiency     Patient Active Problem List   Diagnosis Date Noted   Crohn's disease ([place]) 05/31/2018   GAD (generalized anxiety disorder) 02/24/2018   MDD (major depressive disorder) 02/24/2018    Past Surgical History:  Procedure Laterality Date   BOWEL RESECTION     BREAST BIOPSY     NASAL SINUS SURGERY       OB History     Gravida  0   Para  0   Term  0   Preterm  0   AB  0   Living  0      SAB  0   IAB  0   Ectopic  0   Multiple  0   Live Births  0           Family History  Problem Relation Age of Onset   Heart failure Other    Leukemia Paternal  Grandmother    Hyperlipidemia Mother    Thyroid disease Mother    Anxiety disorder Mother    Depression Mother    Heart attack Father     Social History   Tobacco Use   Smoking status: Never   Smokeless tobacco: Never  Substance Use Topics   Alcohol use: Never   Drug use: Never    Home Medications Prior to Admission medications   Medication Sig Start Date End Date Taking? Authorizing Provider  azaTHIOprine (IMURAN) 50 MG tablet Take 125 mg by mouth daily.    [provider]  clonazePAM (KLONOPIN) 0.5 MG tablet TAKE 1/2- 1 TABLET BY MOUTH three times DAILY AS NEEDED 06/01/20   [name] T, PA-C  cyanocobalamin (,VITAMIN B-12,) 1000 MCG/ML injection Inject 1,000 mcg into the muscle every 30 (thirty) days.    [provider]  ferrous sulfate 325 (65 FE) MG tablet Take by mouth.    [provider]  folic acid (FOLVITE) 1 MG tablet Take 1 mg by mouth daily.  [provider]  inFLIXimab (REMICADE IV) Inject into the vein.    [provider]  lamoTRIgine (LAMICTAL) 150 MG tablet TAKE 1 TABLET(150 MG) BY MOUTH TWICE DAILY 10/25/20   [name] T, PA-C  loratadine (CLARITIN) 10 MG tablet Take 10 mg by mouth daily.    [provider]  Melatonin 10 MG TABS Take 10 mg by mouth.    [provider]  montelukast (SINGULAIR) 10 MG tablet Take 10 mg by mouth at bedtime.    [provider]  omeprazole (PRILOSEC) 20 MG capsule Take 40 mg by mouth daily.    [provider]  rOPINIRole (REQUIP) 4 MG tablet TAKE 2 TABLETS(8 MG) BY MOUTH AT BEDTIME 10/25/20   Hurst, Helene Kelp T, PA-C  sertraline (ZOLOFT) 100 MG tablet Take by mouth.    [provider]  sertraline (ZOLOFT) 100 MG tablet TAKE 2 TABLETS BY MOUTH EVERY DAY, CAN INCREASE TO 250MG BEFORE MENSES AS DIRECTED 10/07/20   Adelene Idler, Helene Kelp T, PA-C  terbinafine (LAMISIL) 250 MG tablet Please take one a day x 7days, repeat every 4 weeks x 4 months 09/22/20   Wallene Huh, DPM  Vitamin D, Ergocalciferol, (DRISDOL) 1.25 MG (50000 UNIT) CAPS capsule Take 1 capsule (50,000 Units total) by mouth every 7 (seven) days. Patient not taking: No sig reported 05/22/19   [name] T, PA-C    Allergies    Wellbutrin [bupropion] and Adhesive [tape]  Review of Systems   Review of Systems  Constitutional:  Negative for fever.  Respiratory:  Negative for shortness of breath.   Cardiovascular:  Negative for chest pain.  Gastrointestinal:  Positive for nausea. Negative for abdominal pain, constipation, diarrhea and vomiting.  Genitourinary:  Positive for flank pain. Negative for dysuria.  Musculoskeletal:  Negative for myalgias.  Skin:  Negative for color change and rash.   Physical Exam Updated Vital Signs BP (!) 143/99 (BP Location: Left Arm)   Pulse 91   Temp 98.9 F (37.2 C) (Oral)   Resp 18   Ht 5' 7"  (1.702 m)   Wt 131.5 kg   LMP 11/25/2020 (Exact Date)   SpO2 100%   BMI 45.42 kg/m   Physical Exam Constitutional:      General: She is not in acute distress.    Appearance: She is well-developed. She is not ill-appearing.     Comments: Uncomfortable appearing.  Pulmonary:     Effort: Pulmonary effort is normal.  Abdominal:     General: There is no distension.     Palpations: Abdomen is soft.     Tenderness: There is no abdominal tenderness. There is right CVA tenderness. There is no left CVA tenderness.  Musculoskeletal:        General: Normal range of motion.     Cervical back: Normal range of motion.  Skin:    General: Skin is warm and dry.  Neurological:     Mental Status: She is alert and oriented to person, place, and time.    ED Results / Procedures / Treatments   Labs (all labs ordered are listed, but only abnormal results are displayed) Labs Reviewed  URINALYSIS, ROUTINE W REFLEX MICROSCOPIC - Abnormal; Notable for the following components:      Result Value   Specific Gravity, Urine >1.030 (*)    Hgb urine dipstick  MODERATE (*)    All other components within normal limits  URINALYSIS, MICROSCOPIC (REFLEX) - Abnormal; Notable for the following components:   Bacteria, UA  FEW (*)    All other components within normal limits  PREGNANCY, URINE    EKG None  Radiology No results found.  Procedures Procedures   Medications Ordered in ED Medications  ondansetron (ZOFRAN) injection 4 mg (has no administration in time range)  HYDROmorphone (DILAUDID) injection 1 mg (has no administration in time range)  ketorolac (TORADOL) 30 MG/ML injection 15 mg (has no administration in time range)    ED Course  I have reviewed the triage vital signs and the nursing notes.  Pertinent labs & imaging results that were available during my care of the patient were reviewed by me and considered in my medical decision making (see chart for details).    MDM Rules/Calculators/A&P                           Patient to ED with right flank pain, nausea w/o vomiting, no fever. H/o renal stones.   CT renal study is not available today. Plain film ordered and shows 3 mm right rental stone. No visualized stones in the ureters or bladder. UA without infection but there are CA ox crystals, c/w stones. Suspect this is the source of her pain. Discussed with Dr. Gilford Raid.   She can be discharged home and will be referred to urology. Will prescribe supportive medications. Return precautions discussed.       Final Clinical Impression(s) / ED Diagnoses Final diagnoses:  None   Renal colic   Rx / DC Orders ED Discharge Orders     None        Dennie Bible 11/26/20 1738    Isla Pence, MD 11/26/20 1818

## 2020-11-26 NOTE — ED Triage Notes (Signed)
Pt c/o right flank pain starting today. Denies urinary symptoms. States saw a kidney stone on her CT 3 years ago. Never did anything with it

## 2020-11-26 NOTE — ED Notes (Signed)
Patient transported to CT 

## 2020-11-28 ENCOUNTER — Ambulatory Visit: Payer: BC Managed Care – PPO | Admitting: Podiatry

## 2020-11-29 ENCOUNTER — Ambulatory Visit: Payer: BC Managed Care – PPO | Admitting: Physician Assistant

## 2020-11-29 ENCOUNTER — Other Ambulatory Visit: Payer: Self-pay

## 2020-11-29 ENCOUNTER — Encounter: Payer: Self-pay | Admitting: Physician Assistant

## 2020-11-29 DIAGNOSIS — F411 Generalized anxiety disorder: Secondary | ICD-10-CM | POA: Diagnosis not present

## 2020-11-29 DIAGNOSIS — Z3009 Encounter for other general counseling and advice on contraception: Secondary | ICD-10-CM

## 2020-11-29 DIAGNOSIS — F3281 Premenstrual dysphoric disorder: Secondary | ICD-10-CM

## 2020-11-29 DIAGNOSIS — G2581 Restless legs syndrome: Secondary | ICD-10-CM

## 2020-11-29 DIAGNOSIS — E559 Vitamin D deficiency, unspecified: Secondary | ICD-10-CM

## 2020-11-29 DIAGNOSIS — F3341 Major depressive disorder, recurrent, in partial remission: Secondary | ICD-10-CM

## 2020-11-29 DIAGNOSIS — K50919 Crohn's disease, unspecified, with unspecified complications: Secondary | ICD-10-CM

## 2020-11-29 DIAGNOSIS — N200 Calculus of kidney: Secondary | ICD-10-CM

## 2020-11-29 NOTE — Progress Notes (Signed)
[facility]  Patient ID: Traci [name],  MRN: [id number]  PCP: London Pepper, MD  Date of Evaluation: 11/29/2020  time spent:30 minutes  Chief Complaint:  Chief Complaint   Follow-up; Depression; Anxiety     HISTORY/CURRENT STATUS: HPI For routine 41-monthmed check.   Meds are still working well. Has had some triggers for sadness like one of her cousins had a baby, she and her husband may want a child as well.  Difficult decision and she is not using birth control, still not getting pregnant.  She is continuing to decrease the ropinirole, feeling that if and when she does conceive, she feels that would not be drug seeking continue.  She is still tolerating the slow decrease in meds.  RLS symptoms are mostly well controlled.  Patient denies loss of interest in usual activities and is able to enjoy things.  Denies decreased energy or motivation.  Work is going well but a little stressful as her boss will be retiring within the next year.  They have a good working relationship so it will be difficult having someone new.  Appetite has not changed.  No extreme sadness, tearfulness, or feelings of hopelessness.  Denies any changes in concentration, making decisions or remembering things.  Sleeps well. Denies suicidal or homicidal thoughts.  Anxiety is still a problem.  She knows what her triggers will be and takes Klonopin beforehand if needed.  She will have racing or ruminating thoughts and she does not take the Klonopin.  It is still effective.   Patient denies increased energy with decreased need for sleep, no increased talkativeness, no racing thoughts, no impulsivity or risky behaviors, no increased spending, no increased libido, no grandiosity, no increased irritability or anger, and no hallucinations.  Denies dizziness, syncope, seizures, numbness, tingling, tremor, tics, unsteady gait, slurred speech, confusion. Denies muscle or joint pain, stiffness, or  dystonia.  Individual Medical History/ Review of Systems: Changes? :Yes   had covid since LOV and recovered fine.  Kidney stone, had to go to ER for that and will have lithotripsy soon.  Crohn's disease symptoms are stable.  Continues to monitor and treat per GI.  Past medications for mental health diagnoses include: Zoloft, Xanax, Wellbutrin, Lexapro, Effexor, Luvox, Cymbalta caused more anxiety then depression  Allergies: Wellbutrin [bupropion] and Adhesive [tape]  Current Medications:  Current Outpatient Medications:    azaTHIOprine (IMURAN) 50 MG tablet, Take 125 mg by mouth daily., Disp: , Rfl:    Cholecalciferol 25 MCG (1000 UT) tablet, Take by mouth., Disp: , Rfl:    clonazePAM (KLONOPIN) 0.5 MG tablet, TAKE 1/2- 1 TABLET BY MOUTH three times DAILY AS NEEDED, Disp: 270 tablet, Rfl: 1   cyanocobalamin (,VITAMIN B-12,) 1000 MCG/ML injection, Inject 1,000 mcg into the muscle every 30 (thirty) days., Disp: , Rfl:    ferrous sulfate 325 (65 FE) MG tablet, Take by mouth., Disp: , Rfl:    fluconazole (DIFLUCAN) 150 MG tablet, Take 1 tablet by mouth as needed., Disp: , Rfl:    fluticasone (FLONASE) 50 MCG/ACT nasal spray, Place into the nose., Disp: , Rfl:    folic acid (FOLVITE) 1 MG tablet, Take 1 mg by mouth daily., Disp: , Rfl:    inFLIXimab (REMICADE IV), Inject into the vein., Disp: , Rfl:    lamoTRIgine (LAMICTAL) 150 MG tablet, TAKE 1 TABLET(150 MG) BY MOUTH TWICE DAILY, Disp: 180 tablet, Rfl: 0   loratadine (CLARITIN) 10 MG tablet, Take 10 mg by mouth daily., Disp: , Rfl:  Melatonin 10 MG TABS, Take by mouth., Disp: , Rfl:    montelukast (SINGULAIR) 10 MG tablet, TAKE 1 TABLET(10 MG) BY MOUTH DAILY, Disp: , Rfl:    omeprazole (PRILOSEC) 40 MG capsule, Take 40 mg by mouth daily., Disp: , Rfl:    oxyCODONE-acetaminophen (PERCOCET/ROXICET) 5-325 MG tablet, Take 1 tablet by mouth every 4 (four) hours as needed for severe pain., Disp: 15 tablet, Rfl: 0   rOPINIRole (REQUIP) 4 MG  tablet, TAKE 2 TABLETS(8 MG) BY MOUTH AT BEDTIME (Patient taking differently: Take 3 mg by mouth. Q hs.), Disp: 180 tablet, Rfl: 0   sertraline (ZOLOFT) 100 MG tablet, TAKE 2 TABLETS BY MOUTH EVERY DAY, CAN INCREASE TO 250MG BEFORE MENSES AS DIRECTED, Disp: 200 tablet, Rfl: 0   Vitamin D, Ergocalciferol, (DRISDOL) 1.25 MG (50000 UNIT) CAPS capsule, Take 1 capsule (50,000 Units total) by mouth every 7 (seven) days., Disp: 12 capsule, Rfl: 0   ondansetron (ZOFRAN ODT) 4 MG disintegrating tablet, Take 1 tablet (4 mg total) by mouth every 8 (eight) hours as needed for nausea or vomiting. (Patient not taking: Reported on 11/29/2020), Disp: 12 tablet, Rfl: 0   terbinafine (LAMISIL) 250 MG tablet, Please take one a day x 7days, repeat every 4 weeks x 4 months (Patient not taking: Reported on 11/29/2020), Disp: 28 tablet, Rfl: 0 Medication Side Effects: none  Family Medical/ Social History: Changes? No  MENTAL HEALTH EXAM:  Last menstrual period 11/25/2020.There is no height or weight on file to calculate BMI.  General Appearance: Casual, Well Groomed and Obese  Eye Contact:  Good  Speech:  Clear and Coherent and Normal Rate  Volume:  Normal  Mood:  Euthymic  Affect:  Appropriate  Thought Process:  Goal Directed and Descriptions of Associations: Intact  Orientation:  Full (Time, Place, and Person)  Thought Content: Logical   Suicidal Thoughts:  No  Homicidal Thoughts:  No  Memory:  WNL  Judgement:  Good  Insight:  Good  Psychomotor Activity:  Normal  Concentration:  Concentration: Good  Recall:  Good  Fund of Knowledge: Good  Language: Good  Assets:  Desire for Improvement  ADL's:  Intact  Cognition: WNL  Prognosis:  Good    DIAGNOSES:    ICD-10-CM   1. Recurrent major depressive disorder, in partial remission ([place])  F33.41     2. Restless leg syndrome  G25.81     3. Generalized anxiety disorder  F41.1     4. Family planning advice  Z30.09     5. PMDD (premenstrual dysphoric  disorder)  F32.81     6. Vitamin D deficiency  E55.9     7. Crohn's disease with complication, unspecified gastrointestinal tract location ([place])  K50.919     8. Kidney stone  N20.0        Receiving Psychotherapy: No    RECOMMENDATIONS:  PDMP reviewed. I provided 30 minutes of face to face time during this encounter, including time spent before and after the visit in records review, medical decision making, and charting.  She's doing well with slow wean of Ropinerole so will cont to wean as tolerated. Briefly discussed family planning. Continue Ropinerole 3 mg nightly.  When she feels like she is ready she can go to 2 mg.   Continue Klonopin 0.5 mg, 1/2-1 tid prn Continue Lamictal 150 mg, 1 po bid. Cont Zoloft 100 mg, 2 qd. Continue MVI, Vit D, B Complex. Return in 3 months,  [name], [place]

## 2020-11-30 ENCOUNTER — Other Ambulatory Visit: Payer: Self-pay | Admitting: Urology

## 2020-11-30 DIAGNOSIS — N200 Calculus of kidney: Secondary | ICD-10-CM

## 2020-12-12 NOTE — Progress Notes (Signed)
Patient to arrive at 1315 on 12/15/2020. History and medications reviewed. Pre-procedure instructions given. Reports stopping Ibuprofen on 12/14/2020. NPO after 0915 day of procedure, clear liquids until 1115. Driver secured.

## 2020-12-15 ENCOUNTER — Encounter (HOSPITAL_BASED_OUTPATIENT_CLINIC_OR_DEPARTMENT_OTHER)
Admission: RE | Disposition: A | Discharge status: Home / Self Care | Payer: Self-pay | Source: Ambulatory Visit | Attending: Urology

## 2020-12-15 ENCOUNTER — Ambulatory Visit (HOSPITAL_BASED_OUTPATIENT_CLINIC_OR_DEPARTMENT_OTHER)
Admission: RE | Admit: 2020-12-15 | Discharge: 2020-12-15 | Disposition: A | Discharge status: Home / Self Care | Payer: BC Managed Care – PPO | Source: Ambulatory Visit | Attending: Urology | Admitting: Urology

## 2020-12-15 ENCOUNTER — Ambulatory Visit (HOSPITAL_COMMUNITY): Payer: BC Managed Care – PPO

## 2020-12-15 ENCOUNTER — Ambulatory Visit: Payer: BC Managed Care – PPO | Admitting: Neurology

## 2020-12-15 ENCOUNTER — Encounter (HOSPITAL_BASED_OUTPATIENT_CLINIC_OR_DEPARTMENT_OTHER): Payer: Self-pay | Admitting: Urology

## 2020-12-15 ENCOUNTER — Other Ambulatory Visit: Payer: Self-pay

## 2020-12-15 DIAGNOSIS — Z6841 Body Mass Index (BMI) 40.0 and over, adult: Secondary | ICD-10-CM | POA: Diagnosis not present

## 2020-12-15 DIAGNOSIS — E669 Obesity, unspecified: Secondary | ICD-10-CM | POA: Diagnosis not present

## 2020-12-15 DIAGNOSIS — N201 Calculus of ureter: Secondary | ICD-10-CM | POA: Diagnosis not present

## 2020-12-15 DIAGNOSIS — K5090 Crohn's disease, unspecified, without complications: Secondary | ICD-10-CM | POA: Diagnosis not present

## 2020-12-15 DIAGNOSIS — N200 Calculus of kidney: Secondary | ICD-10-CM

## 2020-12-15 HISTORY — PX: EXTRACORPOREAL SHOCK WAVE LITHOTRIPSY: SHX1557

## 2020-12-15 LAB — POCT PREGNANCY, URINE: Preg Test, Ur: NEGATIVE

## 2020-12-15 SURGERY — LITHOTRIPSY, ESWL
Anesthesia: LOCAL | Laterality: Right

## 2020-12-15 MED ORDER — TRAMADOL HCL 50 MG PO TABS: 50.0000 mg | ORAL_TABLET | Freq: Four times a day (QID) | ORAL | 0 refills | Status: DC | PRN

## 2020-12-15 MED ORDER — DIPHENHYDRAMINE HCL 25 MG PO CAPS
25.0000 mg | ORAL_CAPSULE | ORAL | Status: AC
  Administered 2020-12-15: 25 mg via ORAL

## 2020-12-15 MED ORDER — DIPHENHYDRAMINE HCL 25 MG PO CAPS
ORAL_CAPSULE | ORAL | Status: AC
  Filled 2020-12-15: qty 1

## 2020-12-15 MED ORDER — SODIUM CHLORIDE 0.9 % IV SOLN
INTRAVENOUS | Status: DC
  Administered 2020-12-15: 1000 mL via INTRAVENOUS

## 2020-12-15 MED ORDER — DIAZEPAM 5 MG PO TABS
ORAL_TABLET | ORAL | Status: AC
  Filled 2020-12-15: qty 2

## 2020-12-15 MED ORDER — CIPROFLOXACIN HCL 500 MG PO TABS
ORAL_TABLET | ORAL | Status: AC
  Filled 2020-12-15: qty 1

## 2020-12-15 MED ORDER — DIAZEPAM 5 MG PO TABS
10.0000 mg | ORAL_TABLET | ORAL | Status: AC
  Administered 2020-12-15: 10 mg via ORAL

## 2020-12-15 MED ORDER — CIPROFLOXACIN HCL 500 MG PO TABS
500.0000 mg | ORAL_TABLET | ORAL | Status: AC
  Administered 2020-12-15: 500 mg via ORAL

## 2020-12-15 NOTE — Op Note (Signed)
See [facility] OP note scanned into chart. Also because of the size, density, location and other factors that cannot be anticipated I feel this will likely be a staged procedure. This fact supersedes any indication in the scanned [facility] operative note to the contrary.  

## 2020-12-15 NOTE — H&P (Signed)
See scanned H&P

## 2020-12-16 ENCOUNTER — Encounter (HOSPITAL_BASED_OUTPATIENT_CLINIC_OR_DEPARTMENT_OTHER): Payer: Self-pay | Admitting: Urology

## 2020-12-19 ENCOUNTER — Encounter: Payer: Self-pay | Admitting: Neurology

## 2020-12-19 ENCOUNTER — Ambulatory Visit: Payer: BC Managed Care – PPO | Admitting: Neurology

## 2020-12-19 VITALS — BP 131/89 | HR 101 | Ht 67.0 in | Wt 289.5 lb

## 2020-12-19 DIAGNOSIS — G44229 Chronic tension-type headache, not intractable: Secondary | ICD-10-CM

## 2020-12-19 DIAGNOSIS — G4440 Drug-induced headache, not elsewhere classified, not intractable: Secondary | ICD-10-CM

## 2020-12-19 DIAGNOSIS — R208 Other disturbances of skin sensation: Secondary | ICD-10-CM

## 2020-12-19 DIAGNOSIS — M542 Cervicalgia: Secondary | ICD-10-CM

## 2020-12-19 DIAGNOSIS — Z8619 Personal history of other infectious and parasitic diseases: Secondary | ICD-10-CM

## 2020-12-19 NOTE — Patient Instructions (Addendum)
Continue your current medications  Limit the use of Tylenol/Ibuprofen to 3 times a week to reduce medication over use headaches  Recommend a trial of acupuncture for neck pain  Neck exercise  Follow up with your PMD as scheduled

## 2020-12-19 NOTE — Progress Notes (Signed)
[facility]  PATIENT: Traci [name] DOB: 1980-05-13  REFERRING CLINICIAN: London Pepper, MD HISTORY FROM: Patient  REASON FOR VISIT: Headaches (resolved)    HISTORICAL  CHIEF COMPLAINT:  Chief Complaint  Patient presents with   Headache    New patient: Headaches as long as she can remember: stopped nsaids. Room 13, alone in room    HISTORY OF PRESENT ILLNESS:  This is a 40 year old woman with past medical history of Major Depression, Anxiety, Crohn's disease who was presenting with chronic headaches and left neck/shoulder pain.   Patient said that she has a history of chronic headaches, headaches throughout her life.  She was having daily headache and was using over-the-counter ibuprofen and Tylenol daily.  Patient said that in April she followed-up with her primary care doctor who recommended to decrease/stop the Tylenol-ibuprofen to avoid rebound headache.  Patient said that since decreasing the pain meds, her headaches have resolved.  Currently she is having about 1 headache per week.  She continues to take still Tylenol ibuprofen as needed but no more than once a week.  When she was having headaches she described the headaches are all over the head, sometimes she feels the pain under the left cheek.  She reports a history of sinusitis and reported they can be associated with headaches.  No nausea, no vomiting, no photophobia, and no phonophobia associated with headaches.    Patient is also reporting a history of neck pain and left left shoulder blade pain. She has been following up with a chiropractor for shoulder pain and neck pain with some mild improvement.  The left shoulder blade pain radiates to the left arm and left thorax.  Patient states that she has a history of chickenpox as a child and has a history of shingles 15 years ago and this sensation feels similar to the pain she had when she had shingles in the same region.  She denies any recurrence of  shingles and denies any rash and redness along that region.   She works as a Garment/textile technologist at [employer]    Headache History and Characteristics:  Onset: all her adult life   Location: All over head Quality: Aching, currently none Intensity: 0/10.  Duration: Daily  Migrainous Features: None Aura: No  History of brain injury or tumor: No  Previous Treatment:  Prophylactic: No Abortive: OTC Tylenol/Motrin Injections: None   OTC: tylenol, codeine Caffeine: no  Prior prophylaxis: Propranolol: No  Verapamil:No TCA: No Topamax: No Depakote: No Effexor: No Cymbalta: No Neurontin:No  Prior abortives: Triptan: No Anti-emetic: No Steroids: No Ergotamine suppository: No  Prior interventions: None   OTHER MEDICAL CONDITIONS: Chron's Disease, MDD, Anxiety, Kidney stone    REVIEW OF SYSTEMS: Full 14 system review of systems performed and negative with exception of: as noted in the HPI  ALLERGIES: Allergies  Allergen Reactions   Wellbutrin [Bupropion] Anxiety   Adhesive [Tape]     HOME MEDICATIONS: Outpatient Medications Prior to Visit  Medication Sig Dispense Refill   acetaminophen (TYLENOL) 500 MG tablet Take 1,000 mg by mouth 2 (two) times daily as needed. Rapid release     azaTHIOprine (IMURAN) 50 MG tablet Take 125 mg by mouth daily.     Cholecalciferol 25 MCG (1000 UT) tablet Take by mouth.     clonazePAM (KLONOPIN) 0.5 MG tablet TAKE 1/2- 1 TABLET BY MOUTH three times DAILY AS NEEDED 270 tablet 1   cyanocobalamin (,VITAMIN B-12,) 1000 MCG/ML injection Inject 1,000 mcg into the  muscle every 30 (thirty) days.     ferrous sulfate 325 (65 FE) MG tablet Take by mouth.     fluconazole (DIFLUCAN) 150 MG tablet Take 1 tablet by mouth as needed.     fluticasone (FLONASE) 50 MCG/ACT nasal spray Place into the nose.     folic acid (FOLVITE) 1 MG tablet Take 1 mg by mouth daily.     ibuprofen (ADVIL) 600 MG tablet Take 600 mg by mouth every 6 (six) hours as needed.      inFLIXimab (REMICADE IV) Inject into the vein.     lamoTRIgine (LAMICTAL) 150 MG tablet TAKE 1 TABLET(150 MG) BY MOUTH TWICE DAILY 180 tablet 0   loratadine (CLARITIN) 10 MG tablet Take 10 mg by mouth daily.     Melatonin 10 MG TABS Take by mouth.     montelukast (SINGULAIR) 10 MG tablet TAKE 1 TABLET(10 MG) BY MOUTH DAILY     omeprazole (PRILOSEC) 40 MG capsule Take 40 mg by mouth daily.     ondansetron (ZOFRAN ODT) 4 MG disintegrating tablet Take 1 tablet (4 mg total) by mouth every 8 (eight) hours as needed for nausea or vomiting. 12 tablet 0   oxyCODONE-acetaminophen (PERCOCET/ROXICET) 5-325 MG tablet Take 1 tablet by mouth every 4 (four) hours as needed for severe pain. 15 tablet 0   rOPINIRole (REQUIP) 4 MG tablet TAKE 2 TABLETS(8 MG) BY MOUTH AT BEDTIME (Patient taking differently: 3 mg.) 180 tablet 0   tamsulosin (FLOMAX) 0.4 MG CAPS capsule Take 0.4 mg by mouth once.     traMADol (ULTRAM) 50 MG tablet Take 1 tablet (50 mg total) by mouth every 6 (six) hours as needed. 10 tablet 0   Vitamin D, Ergocalciferol, (DRISDOL) 1.25 MG (50000 UNIT) CAPS capsule Take 1 capsule (50,000 Units total) by mouth every 7 (seven) days. 12 capsule 0   sertraline (ZOLOFT) 100 MG tablet TAKE 2 TABLETS BY MOUTH EVERY DAY, CAN INCREASE TO 250MG BEFORE MENSES AS DIRECTED 200 tablet 0   terbinafine (LAMISIL) 250 MG tablet Please take one a day x 7days, repeat every 4 weeks x 4 months 28 tablet 0   No facility-administered medications prior to visit.    PAST MEDICAL HISTORY: Past Medical History:  Diagnosis Date   Anemia    Anxiety    Blood clot in vein    Right lower leg after surgery   Crohn's disease (HCC)    Depression    Dyspnea    Elevated BP without diagnosis of hypertension    Enlarged liver    GERD (gastroesophageal reflux disease)    Headache    IBS (irritable bowel syndrome)    Laceration of left little finger 02/28/2018   Lower extremity edema    Restless legs    Seasonal allergies     Uterine fibroid    Vitamin B 12 deficiency    Vitamin D deficiency     PAST SURGICAL HISTORY: Past Surgical History:  Procedure Laterality Date   BOWEL RESECTION     BREAST BIOPSY     EXTRACORPOREAL SHOCK WAVE LITHOTRIPSY Right 12/15/2020   Procedure: EXTRACORPOREAL SHOCK WAVE LITHOTRIPSY (ESWL);  Surgeon: Lucas Mallow, MD;  [facility];  Service: Urology;  Laterality: Right;   NASAL SINUS SURGERY      FAMILY HISTORY: Family History  Problem Relation Age of Onset   Heart failure Other    Leukemia Paternal Grandmother    Hyperlipidemia Mother    Thyroid disease Mother  Anxiety disorder Mother    Depression Mother    Heart attack Father     SOCIAL HISTORY: Social History   Socioeconomic History   Marital status: Married    Spouse name: Donne Robillard   Number of children: Not on file   Years of education: Not on file   Highest education level: Not on file  Occupational History   Occupation: Xray Tech   Tobacco Use   Smoking status: Never   Smokeless tobacco: Never  Substance and Sexual Activity   Alcohol use: Never   Drug use: Never   Sexual activity: Not on file  Other Topics Concern   Not on file  Social History Narrative   Lives with husband   Right Handed   Drinks 2-3 cups caffeine   Social Determinants of Health   Financial Resource Strain: Not on file  Food Insecurity: Not on file  Transportation Needs: Not on file  Physical Activity: Not on file  Stress: Not on file  Social Connections: Not on file  Intimate Partner Violence: Not on file     PHYSICAL EXAM  GENERAL EXAM/CONSTITUTIONAL: Vitals:  Vitals:   12/19/20 0748  BP: 131/89  Pulse: (!) 101  Weight: 289 lb 8 oz (131.3 kg)  Height: 5' 7"  (1.702 m)   Body mass index is 45.34 kg/m. Wt Readings from Last 3 Encounters:  12/19/20 289 lb 8 oz (131.3 kg)  12/15/20 290 lb 11.2 oz (131.9 kg)  11/26/20 290 lb (131.5 kg)   Patient is in no distress; well  developed, nourished and groomed; neck is supple  CARDIOVASCULAR: Examination of carotid arteries is normal; no carotid bruits Regular rate and rhythm, no murmurs Examination of peripheral vascular system by observation and palpation is normal  EYES: Pupils round and reactive to light, Visual fields full to confrontation, Extraocular movements intacts,   MUSCULOSKELETAL: Gait, strength, tone, movements noted in Neurologic exam below. Skin examination shows no rash, no redness, no vesicles.   NEUROLOGIC: MENTAL STATUS:  awake, alert, oriented to person, place and time recent and remote memory intact normal attention and concentration language fluent, comprehension intact, naming intact fund of knowledge appropriate  CRANIAL NERVE: 2nd, 3rd, 4th, 6th - pupils equal and reactive to light, visual fields full to confrontation, extraocular muscles intact, no nystagmus 5th - facial sensation symmetric 7th - facial strength symmetric 8th - hearing intact 9th - palate elevates symmetrically, uvula midline 11th - shoulder shrug symmetric 12th - tongue protrusion midline  MOTOR:  normal bulk and tone, full strength in the BUE, BLE. There is mild left paraspinal tenderness  SENSORY:  normal and symmetric to light touch, pinprick,  vibration.   COORDINATION:  finger-nose-finger, fine finger movements normal  REFLEXES:  deep tendon reflexes present and symmetric  GAIT/STATION:  normal   DIAGNOSTIC DATA (LABS, IMAGING, TESTING) - I reviewed patient records, labs, notes, testing and imaging myself where available.  Lab Results  Component Value Date   WBC 6.7 12/02/2018   HGB 13.6 12/02/2018   HCT 42.8 12/02/2018   MCV 88 12/02/2018      Component Value Date/Time   NA 141 12/02/2018 1355   K 4.4 12/02/2018 1355   CL 103 12/02/2018 1355   CO2 22 12/02/2018 1355   GLUCOSE 91 12/02/2018 1355   BUN 15 12/02/2018 1355   CREATININE 0.74 12/02/2018 1355   CALCIUM 9.3  12/02/2018 1355   PROT 6.5 12/02/2018 1355   ALBUMIN 4.2 12/02/2018 1355   AST 27 12/02/2018  1355   ALT 14 12/02/2018 1355   ALKPHOS 101 12/02/2018 1355   BILITOT 0.3 12/02/2018 1355   GFRNONAA 105 12/02/2018 1355   GFRAA 121 12/02/2018 1355   Lab Results  Component Value Date   CHOL 232 (H) 12/02/2018   HDL 60 12/02/2018   LDLCALC 146 (H) 12/02/2018   TRIG 132 12/02/2018   Lab Results  Component Value Date   HGBA1C 5.1 12/02/2018   Lab Results  Component Value Date   [id number] 371 12/02/2018   Lab Results  Component Value Date   TSH 1.790 12/02/2018     ASSESSMENT AND PLAN  40 y.o. year old female  with past medical history of Crohn's disease, anxiety, depression who is presenting with chronic headache.  Since reducing of daily Tylenol/ ibuprofen to 1 day per week, she reported improvement of her headache.  Currently she is having about 1 headache per week and she uses Tylenol ibuprofen as needed.  Headaches are not associated with nausea, no vomiting, no photophobia or phonophobia no migrainous features.  She also reported history of mild left neck pain, left shoulder blade pain for which she is seeing a chiropractor with mild relief.  She still having tension headache but frequency have markedly improved.  For her left shoulder blade pain/abnormal sensation she reported history of chickenpox as a child, a history of shingles 15 years ago in the same distribution.  I believe that she has Allodynia the same region.  Currently it is not painful, it does not affect her daily activity.  I recommended patient that we continue to monitor but if the gets worse we can prescribe her some lidocaine cream or capsaicin cream for the pain.  I also recommended patient to try acupuncture to see if will help her with her neck pain.  Follow-up with your primary care doctor or return if worse.   1. Chronic tension-type headache, not intractable   2. History of shingles   3. Neck pain   4.  Rebound headache   5. Allodynia      PLAN: Continue your current medications  Limit the use of Tylenol/Ibuprofen to 3 times a week to reduce medication over use headaches  Recommend a trial of acupuncture for neck pain  Neck exercise  Follow up with your PMD as scheduled    No orders of the defined types were placed in this encounter.   No orders of the defined types were placed in this encounter.   Return if symptoms worsen or fail to improve.   Alric Ran, MD 12/19/2020, 8:39 AM  [facility] [address] [place], [place] [address] [phone]

## 2021-01-18 ENCOUNTER — Other Ambulatory Visit: Payer: Self-pay | Admitting: Physician Assistant

## 2021-01-23 NOTE — Telephone Encounter (Signed)
Pt calling to check on status of Zoloft and Lamictal.  Needs to go to [place].

## 2021-02-10 NOTE — Progress Notes (Signed)
[facility] [place] [address] [place] Phone: [phone] Subjective:   [name], am serving as a scribe for Dr. Hulan Saas. This visit occurred during the SARS-CoV-2 public health emergency.  Safety protocols were in place, including screening questions prior to the visit, additional usage of staff PPE, and extensive cleaning of exam room while observing appropriate contact time as indicated for disinfecting solutions.   I'm seeing this patient by the request  of:  London Pepper, MD  CC: Neck and back pain  IRW:[id number]  Traci [name] is a 40 y.o. female coming in with complaint of neck and back pain. Left side of her neck has pain that radiates into her scapula and sometimes the axillary area. No numbness of tingling down the arm or back. Has been seeing a chiropractor for over a year, it helps, but is only momentary.       Past Medical History:  Diagnosis Date   Anemia    Anxiety    Blood clot in vein    Right lower leg after surgery   Crohn's disease (HCC)    Depression    Dyspnea    Elevated BP without diagnosis of hypertension    Enlarged liver    GERD (gastroesophageal reflux disease)    Headache    IBS (irritable bowel syndrome)    Laceration of left little finger 02/28/2018   Lower extremity edema    Restless legs    Seasonal allergies    Uterine fibroid    Vitamin B 12 deficiency    Vitamin D deficiency    Past Surgical History:  Procedure Laterality Date   BOWEL RESECTION     BREAST BIOPSY     EXTRACORPOREAL SHOCK WAVE LITHOTRIPSY Right 12/15/2020   Procedure: EXTRACORPOREAL SHOCK WAVE LITHOTRIPSY (ESWL);  Surgeon: Lucas Mallow, MD;  Location: [facility];  Service: Urology;  Laterality: Right;   NASAL SINUS SURGERY     Social History   Socioeconomic History   Marital status: Married    Spouse name: Kayden Amend   Number of children: Not on file   Years of education: Not on  file   Highest education level: Not on file  Occupational History   Occupation: Xray Tech   Tobacco Use   Smoking status: Never   Smokeless tobacco: Never  Substance and Sexual Activity   Alcohol use: Never   Drug use: Never   Sexual activity: Not on file  Other Topics Concern   Not on file  Social History Narrative   Lives with husband   Right Handed   Drinks 2-3 cups caffeine   Social Determinants of Health   Financial Resource Strain: Not on file  Food Insecurity: Not on file  Transportation Needs: Not on file  Physical Activity: Not on file  Stress: Not on file  Social Connections: Not on file   Allergies  Allergen Reactions   Wellbutrin [Bupropion] Anxiety   Adhesive [Tape]    Family History  Problem Relation Age of Onset   Heart failure Other    Leukemia Paternal Grandmother    Hyperlipidemia Mother    Thyroid disease Mother    Anxiety disorder Mother    Depression Mother    Heart attack Father       Current Outpatient Medications (Respiratory):    fluticasone (FLONASE) 50 MCG/ACT nasal spray, Place into the nose.   loratadine (CLARITIN) 10 MG tablet, Take 10 mg by mouth daily.  montelukast (SINGULAIR) 10 MG tablet, TAKE 1 TABLET(10 MG) BY MOUTH DAILY  Current Outpatient Medications (Analgesics):    acetaminophen (TYLENOL) 500 MG tablet, Take 1,000 mg by mouth 2 (two) times daily as needed. Rapid release   ibuprofen (ADVIL) 600 MG tablet, Take 600 mg by mouth every 6 (six) hours as needed.   oxyCODONE-acetaminophen (PERCOCET/ROXICET) 5-325 MG tablet, Take 1 tablet by mouth every 4 (four) hours as needed for severe pain.   traMADol (ULTRAM) 50 MG tablet, Take 1 tablet (50 mg total) by mouth every 6 (six) hours as needed.  Current Outpatient Medications (Hematological):    cyanocobalamin (,VITAMIN B-12,) 1000 MCG/ML injection, Inject 1,000 mcg into the muscle every 30 (thirty) days.   ferrous sulfate 325 (65 FE) MG tablet, Take by mouth.   folic acid  (FOLVITE) 1 MG tablet, Take 1 mg by mouth daily.  Current Outpatient Medications (Other):    azaTHIOprine (IMURAN) 50 MG tablet, Take 125 mg by mouth daily.   Cholecalciferol 25 MCG (1000 UT) tablet, Take by mouth.   clonazePAM (KLONOPIN) 0.5 MG tablet, TAKE 1/2- 1 TABLET BY MOUTH three times DAILY AS NEEDED   fluconazole (DIFLUCAN) 150 MG tablet, Take 1 tablet by mouth as needed.   inFLIXimab (REMICADE IV), Inject into the vein.   lamoTRIgine (LAMICTAL) 150 MG tablet, TAKE 1 TABLET(150 MG) BY MOUTH TWICE DAILY   Melatonin 10 MG TABS, Take by mouth.   omeprazole (PRILOSEC) 40 MG capsule, Take 40 mg by mouth daily.   ondansetron (ZOFRAN ODT) 4 MG disintegrating tablet, Take 1 tablet (4 mg total) by mouth every 8 (eight) hours as needed for nausea or vomiting.   rOPINIRole (REQUIP) 4 MG tablet, TAKE 2 TABLETS(8 MG) BY MOUTH AT BEDTIME (Patient taking differently: 3 mg.)   sertraline (ZOLOFT) 100 MG tablet, TAKE 2 TABLETS BY MOUTH DAILY, CAN INCREASE TO 250MG BEFORE MENSES AS DIRECTED.   tamsulosin (FLOMAX) 0.4 MG CAPS capsule, Take 0.4 mg by mouth once.   terbinafine (LAMISIL) 250 MG tablet, Please take one a day x 7days, repeat every 4 weeks x 4 months   Vitamin D, Ergocalciferol, (DRISDOL) 1.25 MG (50000 UNIT) CAPS capsule, Take 1 capsule (50,000 Units total) by mouth every 7 (seven) days.   Reviewed prior external information including notes and imaging from  primary care provider As well as notes that were available from care everywhere and other healthcare systems.  Patient has seen neurology for tension type headaches, Seen chiropractors previously as well.  Patient also sees her gastroenterology regularly for her Crohn's disease  Past medical history, social, surgical and family history all reviewed in electronic medical record.  No pertanent information unless stated regarding to the chief complaint.   Review of Systems:  No headache, visual changes, nausea, vomiting, diarrhea,  constipation, dizziness, abdominal pain, skin rash, fevers, chills, night sweats, weight loss, swollen lymph nodes, body aches, joint swelling, chest pain, shortness of breath, mood changes. POSITIVE muscle aches  Objective  Blood pressure 126/84, pulse (!) 105, height 5' 7"  (1.702 m), weight 294 lb (133.4 kg), last menstrual period 02/06/2021, SpO2 99 %.   General: No apparent distress alert and oriented x3 mood and affect normal, [name], [name], non tender, no erythema  Gait normal with good balance and coordination.  MSK: Upper back exam shows the patient does have significant tightness  of the left trapezius.  Some scapular dyskinesis noted.  Tenderness to palpation mostly in the parascapular region.  Tightness of the neck with right and left sidebending.  Low back does have tightness in the thoracolumbar juncture and minorly in the sacroiliac joint.  Poor core strength  Osteopathic findings  C7 flexed rotated and side bent left T4 extended rotated and side bent left inhaled third rib L2 flexed rotated and side bent right Sacrum right on right     Impression and Recommendations:     The above documentation has been reviewed and is accurate and complete Lyndal Pulley, DO

## 2021-02-13 ENCOUNTER — Other Ambulatory Visit: Payer: Self-pay

## 2021-02-13 ENCOUNTER — Ambulatory Visit (INDEPENDENT_AMBULATORY_CARE_PROVIDER_SITE_OTHER): Payer: BC Managed Care – PPO

## 2021-02-13 ENCOUNTER — Ambulatory Visit (INDEPENDENT_AMBULATORY_CARE_PROVIDER_SITE_OTHER): Payer: BC Managed Care – PPO | Admitting: Family Medicine

## 2021-02-13 VITALS — BP 126/84 | HR 105 | Ht 67.0 in | Wt 294.0 lb

## 2021-02-13 DIAGNOSIS — M9903 Segmental and somatic dysfunction of lumbar region: Secondary | ICD-10-CM | POA: Diagnosis not present

## 2021-02-13 DIAGNOSIS — M549 Dorsalgia, unspecified: Secondary | ICD-10-CM

## 2021-02-13 DIAGNOSIS — G2589 Other specified extrapyramidal and movement disorders: Secondary | ICD-10-CM

## 2021-02-13 DIAGNOSIS — M9908 Segmental and somatic dysfunction of rib cage: Secondary | ICD-10-CM

## 2021-02-13 DIAGNOSIS — M542 Cervicalgia: Secondary | ICD-10-CM

## 2021-02-13 DIAGNOSIS — M9902 Segmental and somatic dysfunction of thoracic region: Secondary | ICD-10-CM

## 2021-02-13 DIAGNOSIS — M9904 Segmental and somatic dysfunction of sacral region: Secondary | ICD-10-CM | POA: Diagnosis not present

## 2021-02-13 DIAGNOSIS — M9901 Segmental and somatic dysfunction of cervical region: Secondary | ICD-10-CM

## 2021-02-13 NOTE — Patient Instructions (Addendum)
Xrays today Do prescribed exercises at least 3x a week Vit D 2000 CoQ 10 200 daily Tart cherry 1200 Glutathione 537m See you again in 4-6 weeks

## 2021-02-14 DIAGNOSIS — M9902 Segmental and somatic dysfunction of thoracic region: Secondary | ICD-10-CM | POA: Insufficient documentation

## 2021-02-14 DIAGNOSIS — G2589 Other specified extrapyramidal and movement disorders: Secondary | ICD-10-CM | POA: Insufficient documentation

## 2021-02-14 NOTE — Assessment & Plan Note (Signed)

## 2021-02-14 NOTE — Assessment & Plan Note (Signed)
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  Patient did respond extremely well to osteopathic manipulation.  Discussed which activities to do and which ones to avoid.  Increase activity slowly.  Follow-up with me again 4 to 6 weeks otherwise.  Discussed ergonomics otherwise as well.

## 2021-02-15 ENCOUNTER — Encounter: Payer: Self-pay | Admitting: Family Medicine

## 2021-03-01 ENCOUNTER — Encounter: Payer: Self-pay | Admitting: Physician Assistant

## 2021-03-01 ENCOUNTER — Ambulatory Visit: Payer: BC Managed Care – PPO | Admitting: Physician Assistant

## 2021-03-01 ENCOUNTER — Other Ambulatory Visit: Payer: Self-pay

## 2021-03-01 DIAGNOSIS — G2581 Restless legs syndrome: Secondary | ICD-10-CM

## 2021-03-01 DIAGNOSIS — Z3009 Encounter for other general counseling and advice on contraception: Secondary | ICD-10-CM

## 2021-03-01 DIAGNOSIS — F3341 Major depressive disorder, recurrent, in partial remission: Secondary | ICD-10-CM | POA: Diagnosis not present

## 2021-03-01 DIAGNOSIS — K50919 Crohn's disease, unspecified, with unspecified complications: Secondary | ICD-10-CM

## 2021-03-01 DIAGNOSIS — F411 Generalized anxiety disorder: Secondary | ICD-10-CM | POA: Diagnosis not present

## 2021-03-01 MED ORDER — LAMOTRIGINE 150 MG PO TABS
ORAL_TABLET | ORAL | 3 refills | Status: DC
Start: 2021-03-01 — End: 2021-07-19

## 2021-03-01 MED ORDER — CLONAZEPAM 0.5 MG PO TABS: ORAL_TABLET | ORAL | 1 refills | Status: DC

## 2021-03-01 MED ORDER — SERTRALINE HCL 100 MG PO TABS: ORAL_TABLET | ORAL | 3 refills | Status: DC

## 2021-03-01 NOTE — Progress Notes (Signed)
[facility]  Patient ID: Traci [name],  MRN: [id number]  PCP: London Pepper, MD  Date of Evaluation: 03/01/2021  time spent:30 minutes  Chief Complaint:  Chief Complaint   Anxiety; Depression; Follow-up      HISTORY/CURRENT STATUS: HPI For routine 2-monthmed check.  Her husband SRemo Lippsis with her.  Doing well with mental health meds.  Work is busy but good.  She sleeps well.  She is able to enjoy things and she [place]  They will be staying at an all-inclusive resort.  Denies decreased energy or motivation.  Appetite has not changed.  No extreme sadness, tearfulness, or feelings of hopelessness.  Denies any changes in concentration, making decisions or remembering things.  Denies suicidal or homicidal thoughts.  Has seen GYN for start of fertility work-up.  She and her husband both state "if it happens it happens, if it does not we will be happy either way."  She has been weaning off the ropinirole in preparation though just in case she gets pregnant and is not able to stay on it.  The restless leg is not too bad actually.  She is surprised at how well she is doing without the higher dose.  She eventually wants to get off of the Klonopin as well but right now she has too much anxiety when she does not take it.    Patient denies increased energy with decreased need for sleep, no increased talkativeness, no racing thoughts, no impulsivity or risky behaviors, no increased spending, no increased libido, no grandiosity, no increased irritability or anger, and no hallucinations.  Denies dizziness, syncope, seizures, numbness, tingling, tremor, tics, unsteady gait, slurred speech, confusion. Denies muscle or joint pain, stiffness, or dystonia.  Individual Medical History/ Review of Systems: Changes? :Yes   [facility] for scapular dyskinesis. Had kidney stone[facility] had lithotripsy and stent removal when returned here.  Past medications for mental health diagnoses include: Zoloft, Xanax, Wellbutrin, Lexapro, Effexor, Luvox, Cymbalta caused more anxiety then depression  Allergies: Wellbutrin [bupropion] and Adhesive [tape]  Current Medications:  Current Outpatient Medications:    acetaminophen (TYLENOL) 500 MG tablet, Take 1,000 mg by mouth 2 (two) times daily as needed. Rapid release, Disp: , Rfl:    azaTHIOprine (IMURAN) 50 MG tablet, Take 125 mg by mouth daily., Disp: , Rfl:    Cholecalciferol 25 MCG (1000 UT) tablet, Take by mouth., Disp: , Rfl:    cyanocobalamin (,VITAMIN B-12,) 1000 MCG/ML injection, Inject 1,000 mcg into the muscle every 30 (thirty) days., Disp: , Rfl:    ferrous sulfate 325 (65 FE) MG tablet, Take by mouth., Disp: , Rfl:    fluconazole (DIFLUCAN) 150 MG tablet, Take 1 tablet by mouth as needed., Disp: , Rfl:    fluticasone (FLONASE) 50 MCG/ACT nasal spray, Place into the nose., Disp: , Rfl:    folic acid (FOLVITE) 1 MG tablet, Take 1 mg by mouth daily., Disp: , Rfl:    ibuprofen (ADVIL) 600 MG tablet, Take 600 mg by mouth every 6 (six) hours as needed., Disp: , Rfl:    inFLIXimab (REMICADE IV), Inject into the vein., Disp: , Rfl:    loratadine (CLARITIN) 10 MG tablet, Take 10 mg by mouth daily., Disp: , Rfl:    Melatonin 10 MG TABS, Take by mouth., Disp: , Rfl:  montelukast (SINGULAIR) 10 MG tablet, TAKE 1 TABLET(10 MG) BY MOUTH DAILY, Disp: , Rfl:    norethindrone (AYGESTIN) 5 MG tablet, Take 5 mg by mouth daily., Disp: , Rfl:    omeprazole (PRILOSEC) 40 MG capsule, Take 40 mg by mouth daily., Disp: , Rfl:    rOPINIRole (REQUIP) 4 MG tablet, TAKE 2 TABLETS(8 MG) BY MOUTH AT BEDTIME (Patient taking differently: 1 mg.), Disp: 180 tablet, Rfl: 0   clonazePAM (KLONOPIN) 0.5 MG tablet, TAKE 1/2- 1 TABLET BY MOUTH three times DAILY AS NEEDED, Disp: 270 tablet, Rfl: 1   lamoTRIgine  (LAMICTAL) 150 MG tablet, TAKE 1 TABLET(150 MG) BY MOUTH TWICE DAILY, Disp: 180 tablet, Rfl: 3   sertraline (ZOLOFT) 100 MG tablet, TAKE 2 TABLETS BY MOUTH DAILY, CAN INCREASE TO 250MG BEFORE MENSES AS DIRECTED., Disp: 200 tablet, Rfl: 3 Medication Side Effects: none  Family Medical/ Social History: Changes? No  MENTAL HEALTH EXAM:  Last menstrual period 02/06/2021.There is no height or weight on file to calculate BMI.  General Appearance: Casual, Well Groomed and Obese  Eye Contact:  Good  Speech:  Clear and Coherent and Normal Rate  Volume:  Normal  Mood:  Euthymic  Affect:  Appropriate  Thought Process:  Goal Directed and Descriptions of Associations: Circumstantial  Orientation:  Full (Time, Place, and Person)  Thought Content: Logical   Suicidal Thoughts:  No  Homicidal Thoughts:  No  Memory:  WNL  Judgement:  Good  Insight:  Good  Psychomotor Activity:  Normal  Concentration:  Concentration: Good  Recall:  Good  Fund of Knowledge: Good  Language: Good  Assets:  Desire for Improvement  ADL's:  Intact  Cognition: WNL  Prognosis:  Good    DIAGNOSES:    ICD-10-CM   1. Recurrent major depressive disorder, in partial remission ([place])  F33.41     2. Restless leg syndrome  G25.81     3. Generalized anxiety disorder  F41.1     4. Crohn's disease with complication, unspecified gastrointestinal tract location ([place])  K50.919     5. Family planning advice  Z30.09         Receiving Psychotherapy: No    RECOMMENDATIONS:  PDMP reviewed.  No results available. I provided 30 minutes of face to face time during this encounter, including time spent before and after the visit in records review, medical decision making, counseling pertinent to today's visit, and charting.  I am glad to see her doing so well!  No changes will be made. Family planning discussed.  I agree I hope we can get her completely off the Klonopin before she conceives but I did tell her that most OBs will  allow that in emergencies.  Hopefully she will not need it at all. Continue Ropinerole 1 mg p.o. nightly.   Continue Klonopin 0.5 mg, 1/2-1 tid prn Continue Lamictal 150 mg, 1 po bid. Cont Zoloft 100 mg, 2 qd. Continue MVI, Vit D, B Complex. Return in 3 months,  [name], [place]

## 2021-03-07 NOTE — Progress Notes (Signed)
Benito Mccreedy D.[facility] [place] Phone: [phone]   Assessment and Plan:     1. Acute left-sided thoracic back pain 2. Somatic dysfunction of cervical region 3. Somatic dysfunction of thoracic region 4. Somatic dysfunction of lumbar region 5. Somatic dysfunction of pelvic region 6. Somatic dysfunction of rib region -Chronic with exacerbation, subsequent sports medicine visit - Recurrence of thoracic pain primarily on left side into left shoulder.  Patient had significant relief with OMT in the past so repeat OMT today.  Tolerated well per note below - Start meloxicam 15 mg daily as needed for pain relief   Decision today to treat with OMT was based on Physical Exam   After verbal consent patient was treated with HVLA (high velocity low amplitude), ME (muscle energy), FPR (flex positional release), ST (soft tissue), PC/PD (Pelvic Compression/ Pelvic Decompression) techniques in cervical, rib, thoracic, lumbar, and pelvic areas. Patient tolerated the procedure well with improvement in symptoms.  Patient educated on potential side effects of soreness and recommended to rest, hydrate, and use Tylenol as needed for pain control.   Pertinent previous records reviewed include none   Follow Up: In 4 to 6 weeks for repeat OMT   Subjective:   I, Judy Pimple, am serving as a scribe for Dr. Glennon Mac  Chief Complaint: Left shoulder pain   HPI:   03/08/21 Patient is a 40 year old female presenting with left shoulder pain. Patient was last seen by Dr. Tamala Julian on 02/13/21 and had OMT. Today patient states that the back of left shoulder up to the neck has been sore and tight. Patient states that she felt so good that she never started the exercises and then was not taking it easy. Patient states that she is going to [place] in a few days and wanted to have OMT before leaving and then when she gets back she will start all the  reccommended supplements.  Relevant Historical Information: Crohn's disease  Additional pertinent review of systems negative.  Current Outpatient Medications  Medication Sig Dispense Refill   acetaminophen (TYLENOL) 500 MG tablet Take 1,000 mg by mouth 2 (two) times daily as needed. Rapid release     azaTHIOprine (IMURAN) 50 MG tablet Take 125 mg by mouth daily.     Cholecalciferol 25 MCG (1000 UT) tablet Take by mouth.     clonazePAM (KLONOPIN) 0.5 MG tablet TAKE 1/2- 1 TABLET BY MOUTH three times DAILY AS NEEDED 270 tablet 1   cyanocobalamin (,VITAMIN B-12,) 1000 MCG/ML injection Inject 1,000 mcg into the muscle every 30 (thirty) days.     ferrous sulfate 325 (65 FE) MG tablet Take by mouth.     fluconazole (DIFLUCAN) 150 MG tablet Take 1 tablet by mouth as needed.     fluticasone (FLONASE) 50 MCG/ACT nasal spray Place into the nose.     folic acid (FOLVITE) 1 MG tablet Take 1 mg by mouth daily.     ibuprofen (ADVIL) 600 MG tablet Take 600 mg by mouth every 6 (six) hours as needed.     inFLIXimab (REMICADE IV) Inject into the vein.     lamoTRIgine (LAMICTAL) 150 MG tablet TAKE 1 TABLET(150 MG) BY MOUTH TWICE DAILY 180 tablet 3   loratadine (CLARITIN) 10 MG tablet Take 10 mg by mouth daily.     Melatonin 10 MG TABS Take by mouth.     meloxicam (MOBIC) 15 MG tablet Take 1 tablet (15 mg total) by  mouth daily. 30 tablet 0   montelukast (SINGULAIR) 10 MG tablet TAKE 1 TABLET(10 MG) BY MOUTH DAILY     norethindrone (AYGESTIN) 5 MG tablet Take 5 mg by mouth daily.     omeprazole (PRILOSEC) 40 MG capsule Take 40 mg by mouth daily.     rOPINIRole (REQUIP) 4 MG tablet TAKE 2 TABLETS(8 MG) BY MOUTH AT BEDTIME (Patient taking differently: 1 mg.) 180 tablet 0   sertraline (ZOLOFT) 100 MG tablet TAKE 2 TABLETS BY MOUTH DAILY, CAN INCREASE TO 250MG BEFORE MENSES AS DIRECTED. 200 tablet 3   No current facility-administered medications for this visit.      Objective:     Vitals:   03/08/21 0807   BP: 120/72  Pulse: 82  SpO2: 98%  Weight: 292 lb (132.5 kg)  Height: 5' 7"  (1.702 m)      Body mass index is 45.73 kg/m.    Physical Exam:     General: Well-appearing, cooperative, sitting comfortably in no acute distress.   OMT Physical Exam:  ASIS Compression Test: Positive Right Cervical: TTP paraspinal, C3 RRSL Rib: Bilateral elevated first rib with TTP Thoracic: TTP paraspinal, T4-7 RRSL Lumbar: TTP paraspinal, T12-L2 RLSR Pelvis: Right anterior innominate  Electronically signed by:  Benito Mccreedy D.[facility] 8:48 AM 03/08/21

## 2021-03-08 ENCOUNTER — Ambulatory Visit (INDEPENDENT_AMBULATORY_CARE_PROVIDER_SITE_OTHER): Payer: BC Managed Care – PPO | Admitting: Sports Medicine

## 2021-03-08 ENCOUNTER — Other Ambulatory Visit: Payer: Self-pay | Admitting: Sports Medicine

## 2021-03-08 ENCOUNTER — Other Ambulatory Visit: Payer: Self-pay

## 2021-03-08 VITALS — BP 120/72 | HR 82 | Ht 67.0 in | Wt 292.0 lb

## 2021-03-08 DIAGNOSIS — M9908 Segmental and somatic dysfunction of rib cage: Secondary | ICD-10-CM

## 2021-03-08 DIAGNOSIS — M9901 Segmental and somatic dysfunction of cervical region: Secondary | ICD-10-CM

## 2021-03-08 DIAGNOSIS — M9903 Segmental and somatic dysfunction of lumbar region: Secondary | ICD-10-CM | POA: Diagnosis not present

## 2021-03-08 DIAGNOSIS — M9905 Segmental and somatic dysfunction of pelvic region: Secondary | ICD-10-CM

## 2021-03-08 DIAGNOSIS — M546 Pain in thoracic spine: Secondary | ICD-10-CM | POA: Diagnosis not present

## 2021-03-08 DIAGNOSIS — M9902 Segmental and somatic dysfunction of thoracic region: Secondary | ICD-10-CM

## 2021-03-08 MED ORDER — MELOXICAM 15 MG PO TABS: 15.0000 mg | ORAL_TABLET | Freq: Every day | ORAL | 0 refills | Status: DC

## 2021-03-08 NOTE — Patient Instructions (Addendum)
Good to see you  Meloxicam 24m daily as needed  Follow up 4-6 weeks for repeat OMT

## 2021-03-24 ENCOUNTER — Ambulatory Visit: Payer: BC Managed Care – PPO | Admitting: Family Medicine

## 2021-04-03 ENCOUNTER — Other Ambulatory Visit: Payer: Self-pay | Admitting: Sports Medicine

## 2021-04-13 ENCOUNTER — Other Ambulatory Visit: Payer: Self-pay

## 2021-04-13 ENCOUNTER — Encounter: Payer: Self-pay | Admitting: Podiatry

## 2021-04-13 ENCOUNTER — Ambulatory Visit (INDEPENDENT_AMBULATORY_CARE_PROVIDER_SITE_OTHER): Payer: BC Managed Care – PPO | Admitting: Podiatry

## 2021-04-13 DIAGNOSIS — L600 Ingrowing nail: Secondary | ICD-10-CM | POA: Diagnosis not present

## 2021-04-13 NOTE — Patient Instructions (Signed)

## 2021-04-14 NOTE — Progress Notes (Signed)
Subjective:   Patient ID: Traci [name], female   DOB: 40 y.o.   MRN: [id number]   HPI Patient presents stating my nail has been very painful and I think I am going to have to have it removed at this point   ROS      Objective:  Physical Exam  Neurovascular status intact with thick deformed left hallux nail that is painful when pressed     Assessment:  Chronic deformed painful left hallux nail that is not responded to trimming and soaks     Plan:  H&P discussed treatment options and she is opted for permanent removal.  I explained procedure risk and allowed her to sign consent form after review and at this time I went ahead and infiltrated the left hallux 60 mg like Marcaine mixture sterile prep done and using sterile instrumentation remove the left hallux nail exposed matrix applied phenol 5 applications 30 seconds followed by alcohol lavage sterile dressing gave instructions on soaks applied sterile dressing instructed to leave it on 24 hours but take it off earlier if any throbbing were to occur and she is encouraged to call with questions concerns

## 2021-04-18 NOTE — Progress Notes (Signed)
[facility] [place] [address] [place] Phone: [phone] Subjective:   [name], am serving as a scribe for Dr. Hulan Saas. This visit occurred during the SARS-CoV-2 public health emergency.  Safety protocols were in place, including screening questions prior to the visit, additional usage of staff PPE, and extensive cleaning of exam room while observing appropriate contact time as indicated for disinfecting solutions.   I'm seeing this patient by the request  of:  London Pepper, MD  CC: Back and neck pain follow-up  [id number]  Traci [name] is a 40 y.o. female coming in with complaint of back and neck pain. OMT 02/13/2021. Patient states neck and other areas still bothersome, but getting much better. No new complaints.  Patient states that the osteopathic evaluation has been beneficial.  If she does the exercises on a regular basis does seem to be better as well.  Medications patient has been prescribed: None          Past Medical History:  Diagnosis Date   Anemia    Anxiety    Blood clot in vein    Right lower leg after surgery   Crohn's disease (HCC)    Depression    Dyspnea    Elevated BP without diagnosis of hypertension    Enlarged liver    GERD (gastroesophageal reflux disease)    Headache    IBS (irritable bowel syndrome)    Laceration of left little finger 02/28/2018   Lower extremity edema    Restless legs    Seasonal allergies    Uterine fibroid    Vitamin B 12 deficiency    Vitamin D deficiency     Allergies  Allergen Reactions   Wellbutrin [Bupropion] Anxiety   Adhesive [Tape]      Review of Systems:  No headache, visual changes, nausea, vomiting, diarrhea, constipation, dizziness, abdominal pain, skin rash, fevers, chills, night sweats, weight loss, swollen lymph nodes, body aches, joint swelling, chest pain, shortness of breath, mood changes. POSITIVE muscle aches  Objective  Blood  pressure 118/84, pulse 98, height 5' 7"  (1.702 m), weight 290 lb (131.5 kg), SpO2 98 %.   General: No apparent distress alert and oriented x3 mood and affect normal, [name], [name], non tender, no erythema  Neck exam does have some mild loss of lordosis.  The patient does have some mild increase in kyphosis of the upper thoracic spine.  Does have tightness noted in the parascapular region bilaterally right greater than left. Low back exam did have more tenderness noted over the right sacroiliac joint with positive FABER test.  Osteopathic findings  C2 flexed rotated and side bent right C6 flexed rotated and side bent left T3 extended rotated and side bent right inhaled rib T9 extended rotated and side bent left L2 flexed rotated and side bent right Sacrum right on right       Assessment and Plan:  Scapular dyskinesis Chronic problem but overall stable.  Does respond well to osteopathic manipulation.  Discussed posture and ergonomics, discussed which activities to do and which ones to avoid.  Did manipulate more of the lower back as well secondary to tightness today.  Increase activity slowly and follow-up again in 6 to 8 weeks   Nonallopathic problems  Decision today to treat with OMT was based on  Physical Exam  After verbal consent patient was treated with HVLA, ME, [name], rib, thoracic, lumbar, [name], stretches and lifestyle modifications  See medications in patient instructions if given  Patient will follow up in 6-8 weeks      The above documentation has been reviewed and is accurate and complete Lyndal Pulley, DO        Note: This dictation was prepared  with Dragon dictation along with smaller phrase technology. Any transcriptional errors that result from this process are unintentional.

## 2021-04-19 ENCOUNTER — Other Ambulatory Visit: Payer: Self-pay

## 2021-04-19 ENCOUNTER — Ambulatory Visit (INDEPENDENT_AMBULATORY_CARE_PROVIDER_SITE_OTHER): Payer: BC Managed Care – PPO | Admitting: Family Medicine

## 2021-04-19 VITALS — BP 118/84 | HR 98 | Ht 67.0 in | Wt 290.0 lb

## 2021-04-19 DIAGNOSIS — M9902 Segmental and somatic dysfunction of thoracic region: Secondary | ICD-10-CM | POA: Diagnosis not present

## 2021-04-19 DIAGNOSIS — M9903 Segmental and somatic dysfunction of lumbar region: Secondary | ICD-10-CM | POA: Diagnosis not present

## 2021-04-19 DIAGNOSIS — M9901 Segmental and somatic dysfunction of cervical region: Secondary | ICD-10-CM

## 2021-04-19 DIAGNOSIS — M9904 Segmental and somatic dysfunction of sacral region: Secondary | ICD-10-CM

## 2021-04-19 DIAGNOSIS — M9908 Segmental and somatic dysfunction of rib cage: Secondary | ICD-10-CM

## 2021-04-19 DIAGNOSIS — G2589 Other specified extrapyramidal and movement disorders: Secondary | ICD-10-CM

## 2021-04-19 NOTE — Assessment & Plan Note (Signed)
Chronic problem but overall stable.  Does respond well to osteopathic manipulation.  Discussed posture and ergonomics, discussed which activities to do and which ones to avoid.  Did manipulate more of the lower back as well secondary to tightness today.  Increase activity slowly and follow-up again in 6 to 8 weeks

## 2021-04-19 NOTE — Patient Instructions (Signed)
Good to see you! You popped and cracked well Try to do the exercises when time permits See you again in 2 months

## 2021-05-24 NOTE — Progress Notes (Signed)
Traci [name] [address] [place] Phone: [phone] Subjective:   [name], am serving as a scribe for Dr. Hulan Saas. This visit occurred during the SARS-CoV-2 public health emergency.  Safety protocols were in place, including screening questions prior to the visit, additional usage of staff PPE, and extensive cleaning of exam room while observing appropriate contact time as indicated for disinfecting solutions.   I'm seeing this patient by the request  of:  London Pepper, MD  CC: Low back pain follow-up  [id number]  Dacey Milberger is a 41 y.o. female coming in with complaint of back and neck pain. OMT 04/19/2021. Patient states lower back pain started Monday after helping someone move and then slept on a different bed. Took meloxicam and heating pad helped. No other complaints.  Patient denies any significant radiation of pain.  States that it is very tight though.  Medications patient has been prescribed: None  Taking:         Reviewed prior external information including notes and imaging from previsou exam, outside providers and external EMR if available.   As well as notes that were available from care everywhere and other healthcare systems.  Past medical history, social, surgical and family history all reviewed in electronic medical record.  No pertanent information unless stated regarding to the chief complaint.   Past Medical History:  Diagnosis Date   Anemia    Anxiety    Blood clot in vein    Right lower leg after surgery   Crohn's disease (HCC)    Depression    Dyspnea    Elevated BP without diagnosis of hypertension    Enlarged liver    GERD (gastroesophageal reflux disease)    Headache    IBS (irritable bowel syndrome)    Laceration of left little finger 02/28/2018   Lower extremity edema    Restless legs    Seasonal allergies    Uterine fibroid    Vitamin B 12 deficiency    Vitamin D  deficiency     Allergies  Allergen Reactions   Wellbutrin [Bupropion] Anxiety   Adhesive [Tape]      Review of Systems:  No headache, visual changes, nausea, vomiting, diarrhea, constipation, dizziness, abdominal pain, skin rash, fevers, chills, night sweats, weight loss, swollen lymph nodes, body aches, joint swelling, chest pain, shortness of breath, mood changes. POSITIVE muscle aches  Objective  Blood pressure 124/90, pulse 99, height 5' 7"  (1.702 m), weight 288 lb (130.6 kg), SpO2 98 %.   General: No apparent distress alert and oriented x3 mood and affect normal, [name], [name], non tender, no erythema  Low back exam does have some loss of lordosis.  5 out of 5 strength in lower extremities.  Increasing tightness of the left FABER test.  Osteopathic findings  C6 flexed rotated and side bent left T3 extended rotated and side bent left inhaled rib T9 extended rotated and side bent left L2 flexed rotated and side bent right Sacrum left on left       Assessment and Plan:  Low back pain Pain seems to be more muscular in nature.  Significant pain now that is limiting range of motion.  Discussed icing regimen and home exercises.  Discussed which activities to do which wants to avoid.  Toradol and Depo-Medrol  given today.  Given Zanaflex to take at night will be beneficial.  Patient will increase activity slowly.  Follow-up with me again 4 weeks   Nonallopathic problems  Decision today to treat with OMT was based on Physical Exam  After verbal consent patient was treated with HVLA, ME, [name], rib, thoracic, lumbar, [name], stretches and lifestyle modifications  See  medications in patient instructions if given  Patient will follow up in 4-8 weeks      The above documentation has been reviewed and is accurate and complete Lyndal Pulley, DO       Note: This dictation was prepared with Dragon dictation along with smaller phrase technology. Any transcriptional errors that result from this process are unintentional.

## 2021-05-25 ENCOUNTER — Ambulatory Visit: Payer: BC Managed Care – PPO | Admitting: Family Medicine

## 2021-05-25 ENCOUNTER — Encounter: Payer: Self-pay | Admitting: Family Medicine

## 2021-05-25 ENCOUNTER — Other Ambulatory Visit: Payer: Self-pay

## 2021-05-25 VITALS — BP 124/90 | HR 99 | Ht 67.0 in | Wt 288.0 lb

## 2021-05-25 DIAGNOSIS — M9908 Segmental and somatic dysfunction of rib cage: Secondary | ICD-10-CM | POA: Diagnosis not present

## 2021-05-25 DIAGNOSIS — M545 Low back pain, unspecified: Secondary | ICD-10-CM | POA: Diagnosis not present

## 2021-05-25 DIAGNOSIS — M9904 Segmental and somatic dysfunction of sacral region: Secondary | ICD-10-CM

## 2021-05-25 DIAGNOSIS — M9902 Segmental and somatic dysfunction of thoracic region: Secondary | ICD-10-CM

## 2021-05-25 DIAGNOSIS — M9901 Segmental and somatic dysfunction of cervical region: Secondary | ICD-10-CM

## 2021-05-25 DIAGNOSIS — M9903 Segmental and somatic dysfunction of lumbar region: Secondary | ICD-10-CM

## 2021-05-25 MED ORDER — TIZANIDINE HCL 4 MG PO TABS: 4.0000 mg | ORAL_TABLET | Freq: Every day | ORAL | 0 refills | Status: DC

## 2021-05-25 MED ORDER — METHYLPREDNISOLONE ACETATE 80 MG/ML IJ SUSP
80.0000 mg | Freq: Once | INTRAMUSCULAR | Status: AC
  Administered 2021-05-25: 80 mg via INTRAMUSCULAR

## 2021-05-25 MED ORDER — KETOROLAC TROMETHAMINE 60 MG/2ML IM SOLN
60.0000 mg | Freq: Once | INTRAMUSCULAR | Status: AC
  Administered 2021-05-25: 60 mg via INTRAMUSCULAR

## 2021-05-25 NOTE — Patient Instructions (Signed)
Zanaflex 26m at night Do prescribed exercises at least 3x a week  See you again in 4 weeks

## 2021-05-25 NOTE — Assessment & Plan Note (Signed)
Pain seems to be more muscular in nature.  Significant pain now that is limiting range of motion.  Discussed icing regimen and home exercises.  Discussed which activities to do which wants to avoid.  Toradol and Depo-Medrol given today.  Given Zanaflex to take at night will be beneficial.  Patient will increase activity slowly.  Follow-up with me again 4 weeks

## 2021-06-07 ENCOUNTER — Other Ambulatory Visit: Payer: Self-pay

## 2021-06-07 ENCOUNTER — Encounter: Payer: Self-pay | Admitting: Physician Assistant

## 2021-06-07 ENCOUNTER — Ambulatory Visit (INDEPENDENT_AMBULATORY_CARE_PROVIDER_SITE_OTHER): Payer: BC Managed Care – PPO | Admitting: Physician Assistant

## 2021-06-07 DIAGNOSIS — F4321 Adjustment disorder with depressed mood: Secondary | ICD-10-CM

## 2021-06-07 DIAGNOSIS — F3341 Major depressive disorder, recurrent, in partial remission: Secondary | ICD-10-CM

## 2021-06-07 DIAGNOSIS — F411 Generalized anxiety disorder: Secondary | ICD-10-CM

## 2021-06-07 DIAGNOSIS — G2581 Restless legs syndrome: Secondary | ICD-10-CM

## 2021-06-07 NOTE — Progress Notes (Signed)
[facility]  Patient ID: Traci [name],  MRN: [id number]  PCP: London Pepper, MD  Date of Evaluation: 06/07/2021 time spent:20 minutes  Chief Complaint:  Chief Complaint   Anxiety; Depression; Follow-up     HISTORY/CURRENT STATUS: HPI For routine 69-monthmed check.    Her father-in-law died suddenly last week . That's been tough. But states she's doing ok for the most part.  She is happy for him [place].  Has been able to enjoy things.  Denies decreased energy or motivation.  Appetite has not changed.  No extreme sadness, tearfulness, or feelings of hopelessness.  Denies any changes in concentration, making decisions or remembering things.  Denies suicidal or homicidal thoughts.  Anxiety is well-controlled, even in spite of everything that's going on. 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  Sleeping okay for the most part.  Has not decreased the ropinirole any further.  Patient denies increased energy with decreased need for sleep, no increased talkativeness, no racing thoughts, no impulsivity or risky behaviors, no increased spending, no increased libido, no grandiosity, no increased irritability or anger, and no hallucinations.  Denies dizziness, syncope, seizures, numbness, tingling, tremor, tics, unsteady gait, slurred speech, confusion. Denies muscle or joint pain, stiffness, or dystonia.  Individual Medical History/ Review of Systems: Changes? :No      Past medications for mental health diagnoses include: Zoloft, Xanax, Wellbutrin, Lexapro, Effexor, Luvox, Cymbalta caused more anxiety then depression  Allergies: Wellbutrin [bupropion] and Adhesive [tape]  Current Medications:  Current Outpatient Medications:    acetaminophen (TYLENOL) 500 MG tablet, Take 1,000 mg by mouth 2 (two) times daily as needed. Rapid release, Disp: , Rfl:    azaTHIOprine (IMURAN) 50 MG  tablet, Take 125 mg by mouth daily., Disp: , Rfl:    Cholecalciferol 25 MCG (1000 UT) tablet, Take by mouth., Disp: , Rfl:    clonazePAM (KLONOPIN) 0.5 MG tablet, TAKE 1/2- 1 TABLET BY MOUTH three times DAILY AS NEEDED, Disp: 270 tablet, Rfl: 1   cyanocobalamin (,VITAMIN B-12,) 1000 MCG/ML injection, Inject 1,000 mcg into the muscle every 30 (thirty) days., Disp: , Rfl:    ferrous sulfate 325 (65 FE) MG tablet, Take by mouth., Disp: , Rfl:    fluticasone (FLONASE) 50 MCG/ACT nasal spray, Place into the nose., Disp: , Rfl:    folic acid (FOLVITE) 1 MG tablet, Take 1 mg by mouth daily., Disp: , Rfl:    ibuprofen (ADVIL) 600 MG tablet, Take 600 mg by mouth every 6 (six) hours as needed., Disp: , Rfl:    inFLIXimab (REMICADE IV), Inject into the vein., Disp: , Rfl:    lamoTRIgine (LAMICTAL) 150 MG tablet, TAKE 1 TABLET(150 MG) BY MOUTH TWICE DAILY, Disp: 180 tablet, Rfl: 3   loratadine (CLARITIN) 10 MG tablet, Take 10 mg by mouth daily., Disp: , Rfl:    montelukast (SINGULAIR) 10 MG tablet, TAKE 1 TABLET(10 MG) BY MOUTH DAILY, Disp: , Rfl:    norethindrone (AYGESTIN) 5 MG tablet, Take 5 mg by mouth daily., Disp: , Rfl:    omeprazole (PRILOSEC) 40 MG capsule, Take 40 mg by mouth daily., Disp: , Rfl:    rOPINIRole (REQUIP) 4 MG tablet, TAKE 2 TABLETS(8 MG) BY MOUTH AT BEDTIME (Patient taking differently: 1 mg.), Disp: 180 tablet, Rfl: 0   sertraline (ZOLOFT) 100 MG tablet, TAKE 2 TABLETS BY MOUTH DAILY, CAN INCREASE  TO 250MG BEFORE MENSES AS DIRECTED., Disp: 200 tablet, Rfl: 3   tiZANidine (ZANAFLEX) 4 MG tablet, Take 1 tablet (4 mg total) by mouth at bedtime., Disp: 30 tablet, Rfl: 0   fluconazole (DIFLUCAN) 150 MG tablet, Take 1 tablet by mouth as needed., Disp: , Rfl:    Melatonin 10 MG TABS, Take by mouth. (Patient not taking: Reported on 06/07/2021), Disp: , Rfl:    meloxicam (MOBIC) 15 MG tablet, Take 1 tablet (15 mg total) by mouth daily. (Patient not taking: Reported on 06/07/2021), Disp: 30 tablet,  Rfl: 0 Medication Side Effects: none  Family Medical/ Social History: Changes? No  MENTAL HEALTH EXAM:  There were no vitals taken for this visit.There is no height or weight on file to calculate BMI.  General Appearance: Casual, Well Groomed and Obese  Eye Contact:  Good  Speech:  Clear and Coherent and Normal Rate  Volume:  Normal  Mood:  Euthymic  Affect:  Appropriate  Thought Process:  Goal Directed and Descriptions of Associations: Circumstantial  Orientation:  Full (Time, Place, and Person)  Thought Content: Logical   Suicidal Thoughts:  No  Homicidal Thoughts:  No  Memory:  WNL  Judgement:  Good  Insight:  Good  Psychomotor Activity:  Normal  Concentration:  Concentration: Good and Attention Span: Good  Recall:  Good  Fund of Knowledge: Good  Language: Good  Assets:  Desire for Improvement  ADL's:  Intact  Cognition: WNL  Prognosis:  Good    DIAGNOSES:    ICD-10-CM   1. Recurrent major depressive disorder, in partial remission ([place])  F33.41     2. Generalized anxiety disorder  F41.1     3. Restless leg syndrome  G25.81     4. Grief  F43.21          Receiving Psychotherapy: No    RECOMMENDATIONS:  PDMP reviewed.  No results available I provided 20 minutes of face to face time during this encounter, including time spent before and after the visit in records review, medical decision making, counseling pertinent to today's visit, and charting.  My condolences in the loss of her father-in-law. No changes in meds at this time.  Continue Ropinerole 1 mg p.o. nightly.   Continue Klonopin 0.5 mg, 1/2-1 tid prn Continue Lamictal 150 mg, 1 po bid. Cont Zoloft 100 mg, 2 qd. Continue MVI, Vit D, B Complex. Return in 6 to 8 weeks.  [name], PA-C

## 2021-06-20 NOTE — Progress Notes (Signed)
[facility] Phone: [phone] Subjective:   Fontaine No, am serving as a scribe for Dr. Hulan Saas.  This visit occurred during the SARS-CoV-2 public health emergency.  Safety protocols were in place, including screening questions prior to the visit, additional usage of staff PPE, and extensive cleaning of exam room while observing appropriate contact time as indicated for disinfecting solutions.    I'm seeing this patient by the request  of:  London Pepper, MD  CC: Back and neck pain follow-up  [id number]  Traci [name] is a 41 y.o. female coming in with complaint of back and neck pain. OMT 05/25/2021. Patient states she has been doing fine since last visit.   Medications patient has been prescribed: Zanaflex  Taking:         Reviewed prior external information including notes and imaging from previsou exam, outside providers and external EMR if available.   As well as notes that were available from care everywhere and other healthcare systems.  Past medical history, social, surgical and family history all reviewed in electronic medical record.  No pertanent information unless stated regarding to the chief complaint.   Past Medical History:  Diagnosis Date   Anemia    Anxiety    Blood clot in vein    Right lower leg after surgery   Crohn's disease (HCC)    Depression    Dyspnea    Elevated BP without diagnosis of hypertension    Enlarged liver    GERD (gastroesophageal reflux disease)    Headache    IBS (irritable bowel syndrome)    Laceration of left little finger 02/28/2018   Lower extremity edema    Restless legs    Seasonal allergies    Uterine fibroid    Vitamin B 12 deficiency    Vitamin D deficiency     Allergies  Allergen Reactions   Wellbutrin [Bupropion] Anxiety   Adhesive [Tape]      Review of Systems:  No headache, visual changes, nausea, vomiting, diarrhea,  constipation, dizziness, abdominal pain, skin rash, fevers, chills, night sweats, weight loss, swollen lymph nodes, body aches, joint swelling, chest pain, shortness of breath, mood changes. POSITIVE muscle aches, body aches  Objective  Blood pressure 108/74, pulse 88, height 5' 7"  (1.702 m), weight 282 lb (127.9 kg), SpO2 97 %.   General: No apparent distress alert and oriented x3 mood and affect normal, [name], [name], non tender, no erythema  Patient's neck exam does have some loss of lordosis.  Tightness noted.  Paraspinal musculature does have significant discomfort noted and tightness more on the left than the right.  Tightness in the left parascapular region as well.  Osteopathic findings  C2 flexed rotated and side bent left  C6 flexed rotated and side bent left T3 extended rotated and side bent right inhaled rib T9 extended rotated and side bent left L2 flexed rotated and side bent right Sacrum right on right       Assessment and Plan:  Scapular dyskinesis discussed worsening symptom Patient recently did have a loss in her family.  Patient does have some increasing tightness with stress.  Discussed icing regimen and home exercises, discussed which activities to do which wants to avoid.  Increase activity slowly.  Follow-up again in 6 to  8 weeks    Nonallopathic problems  Decision today to treat with OMT was based on Physical Exam  After verbal consent patient was treated with HVLA, ME, [name], rib, thoracic, lumbar, [name], stretches and lifestyle modifications  See medications in patient instructions if given  Patient will follow up in 4-8 weeks      The  above documentation has been reviewed and is accurate and complete Lyndal Pulley, DO        Note: This dictation was prepared with Dragon dictation along with smaller phrase technology. Any transcriptional errors that result from this process are unintentional.

## 2021-06-22 ENCOUNTER — Other Ambulatory Visit: Payer: Self-pay

## 2021-06-22 ENCOUNTER — Ambulatory Visit: Payer: BC Managed Care – PPO | Admitting: Family Medicine

## 2021-06-22 VITALS — BP 108/74 | HR 88 | Ht 67.0 in | Wt 282.0 lb

## 2021-06-22 DIAGNOSIS — G2589 Other specified extrapyramidal and movement disorders: Secondary | ICD-10-CM

## 2021-06-22 DIAGNOSIS — M9908 Segmental and somatic dysfunction of rib cage: Secondary | ICD-10-CM | POA: Diagnosis not present

## 2021-06-22 DIAGNOSIS — M9902 Segmental and somatic dysfunction of thoracic region: Secondary | ICD-10-CM

## 2021-06-22 DIAGNOSIS — M9904 Segmental and somatic dysfunction of sacral region: Secondary | ICD-10-CM | POA: Diagnosis not present

## 2021-06-22 DIAGNOSIS — M9903 Segmental and somatic dysfunction of lumbar region: Secondary | ICD-10-CM | POA: Diagnosis not present

## 2021-06-22 DIAGNOSIS — M9901 Segmental and somatic dysfunction of cervical region: Secondary | ICD-10-CM | POA: Diagnosis not present

## 2021-06-22 NOTE — Patient Instructions (Signed)
Sorry for your loss Take time for yourself See me in 4-6 weeks

## 2021-06-22 NOTE — Assessment & Plan Note (Signed)
discussed worsening symptom Patient recently did have a loss in her family.  Patient does have some increasing tightness with stress.  Discussed icing regimen and home exercises, discussed which activities to do which wants to avoid.  Increase activity slowly.  Follow-up again in 6 to 8 weeks

## 2021-07-06 ENCOUNTER — Other Ambulatory Visit: Payer: Self-pay | Admitting: Family Medicine

## 2021-07-13 NOTE — Progress Notes (Signed)
?Charlann Boxer D.O. ?[facility] ?[place] ?Phone: [phone] ?Subjective:   ?I, Traci [name], am serving as a Education administrator for Dr. Hulan Saas. ?This visit occurred during the SARS-CoV-2 public health emergency.  Safety protocols were in place, including screening questions prior to the visit, additional usage of staff PPE, and extensive cleaning of exam room while observing appropriate contact time as indicated for disinfecting solutions.  ? ?I'm seeing this patient by the request  of:  London Pepper, MD ? ?CC: Neck and back pain follow-up ? ?[id number]  ?Milica Gully is a 41 y.o. female coming in with complaint of back and neck pain. OMT on 06/22/2021. Patient states same per usual. Had some numbness in left arm that lasted a whole day. Lower back is bothersome today. No new complaints. ? ?Medications patient has been prescribed: Zanaflex ? ?Taking: ? ? ?  ? ? ? ? ?Reviewed prior external information including notes and imaging from previsou exam, outside providers and external EMR if available.  ? ?As well as notes that were available from care everywhere and other healthcare systems. ? ?Past medical history, social, surgical and family history all reviewed in electronic medical record.  No pertanent information unless stated regarding to the chief complaint.  ? ?Past Medical History:  ?Diagnosis Date  ? Anemia   ? Anxiety   ? Blood clot in vein   ? Right lower leg after surgery  ? Crohn's disease ([place])   ? Depression   ? Dyspnea   ? Elevated BP without diagnosis of hypertension   ? Enlarged liver   ? GERD (gastroesophageal reflux disease)   ? Headache   ? IBS (irritable bowel syndrome)   ? Laceration of left little finger 02/28/2018  ? Lower extremity edema   ? Restless legs   ? Seasonal allergies   ? Uterine fibroid   ? Vitamin B 12 deficiency   ? Vitamin D deficiency   ?  ?Allergies  ?Allergen Reactions  ? Wellbutrin [Bupropion] Anxiety  ? Adhesive [Tape]    ? ? ? ?Review of Systems: ? No headache, visual changes, nausea, vomiting, diarrhea, constipation, dizziness, abdominal pain, skin rash, fevers, chills, night sweats, weight loss, swollen lymph nodes, body aches, joint swelling, chest pain, shortness of breath, mood changes. POSITIVE muscle aches ? ?Objective  ?Blood pressure 126/82, pulse 87, height 5' 7"  (1.702 m), weight 281 lb (127.5 kg), SpO2 99 %. ?  ?General: No apparent distress alert and oriented x3 mood and affect normal, dressed appropriately.  ?HEENT: Pupils equal, extraocular movements intact  ?Respiratory: Patient's speak in full sentences and does not appear short of breath  ?Cardiovascular: No lower extremity edema, non tender, no erythema  ?Musculoskeletal exam shows the patient does have poor core strength noted.  Tenderness to palpation in the paraspinal musculature.  Patient does have tightness with FABER test bilaterally right greater than left. ? ?Osteopathic findings ? ?C6 flexed rotated and side bent left ?T3 extended rotated and side bent left inhaled rib ?T9 extended rotated and side bent left ?L2 flexed rotated and side bent right ?Sacrum right on right ? ? ? ? ?  ?Assessment and Plan: ? ?Low back pain ?Mild increasing in tenderness and pain at the moment.  This seems to be a chronic problem with exacerbation.  Discussed using the Zanaflex regularly at night.  Discussed which activities to do and which ones to avoid.  Increase activity slowly.  Follow-up again in 6 to 8 weeks  ? ?  Nonallopathic problems ? ?Decision today to treat with OMT was based on Physical Exam ? ?After verbal consent patient was treated with HVLA, ME, [name], rib, thoracic, lumbar, and sacral  areas ? ?Patient tolerated the procedure well with improvement in symptoms ? ?Patient given exercises, stretches and lifestyle modifications ? ?See medications in patient instructions if given ? ?Patient will follow up in 4-8 weeks ? ?  ? ? ?The above  documentation has been reviewed and is accurate and complete Lyndal Pulley, DO ? ? ? ?  ? ? Note: This dictation was prepared with Dragon dictation along with smaller phrase technology. Any transcriptional errors that result from this process are unintentional.    ?  ?  ? ?

## 2021-07-14 ENCOUNTER — Other Ambulatory Visit: Payer: Self-pay

## 2021-07-14 ENCOUNTER — Ambulatory Visit: Payer: BC Managed Care – PPO | Admitting: Family Medicine

## 2021-07-14 VITALS — BP 126/82 | HR 87 | Ht 67.0 in | Wt 281.0 lb

## 2021-07-14 DIAGNOSIS — M9908 Segmental and somatic dysfunction of rib cage: Secondary | ICD-10-CM | POA: Diagnosis not present

## 2021-07-14 DIAGNOSIS — M9901 Segmental and somatic dysfunction of cervical region: Secondary | ICD-10-CM

## 2021-07-14 DIAGNOSIS — M9903 Segmental and somatic dysfunction of lumbar region: Secondary | ICD-10-CM | POA: Diagnosis not present

## 2021-07-14 DIAGNOSIS — M545 Low back pain, unspecified: Secondary | ICD-10-CM

## 2021-07-14 DIAGNOSIS — M9904 Segmental and somatic dysfunction of sacral region: Secondary | ICD-10-CM | POA: Diagnosis not present

## 2021-07-14 DIAGNOSIS — M9902 Segmental and somatic dysfunction of thoracic region: Secondary | ICD-10-CM | POA: Diagnosis not present

## 2021-07-14 NOTE — Assessment & Plan Note (Signed)
Mild increasing in tenderness and pain at the moment.  This seems to be a chronic problem with exacerbation.  Discussed using the Zanaflex regularly at night.  Discussed which activities to do and which ones to avoid.  Increase activity slowly.  Follow-up again in 6 to 8 weeks ?

## 2021-07-14 NOTE — Patient Instructions (Addendum)
Good to see you!  ?Travel safe  ?Keep doing exercises ?See you again in 5-6 weeks ?

## 2021-07-19 ENCOUNTER — Other Ambulatory Visit: Payer: Self-pay

## 2021-07-19 ENCOUNTER — Ambulatory Visit (INDEPENDENT_AMBULATORY_CARE_PROVIDER_SITE_OTHER): Payer: BC Managed Care – PPO | Admitting: Physician Assistant

## 2021-07-19 ENCOUNTER — Encounter: Payer: Self-pay | Admitting: Physician Assistant

## 2021-07-19 DIAGNOSIS — F411 Generalized anxiety disorder: Secondary | ICD-10-CM | POA: Diagnosis not present

## 2021-07-19 DIAGNOSIS — G2581 Restless legs syndrome: Secondary | ICD-10-CM | POA: Diagnosis not present

## 2021-07-19 DIAGNOSIS — F4321 Adjustment disorder with depressed mood: Secondary | ICD-10-CM | POA: Diagnosis not present

## 2021-07-19 DIAGNOSIS — F331 Major depressive disorder, recurrent, moderate: Secondary | ICD-10-CM

## 2021-07-19 MED ORDER — LAMOTRIGINE 200 MG PO TABS: 200.0000 mg | ORAL_TABLET | Freq: Two times a day (BID) | ORAL | 0 refills | Status: DC

## 2021-07-19 NOTE — Progress Notes (Signed)
[facility] ? ?Patient ID: Traci [name],  ?MRN: [id number] ? ?PCP: London Pepper, MD ? ?Date of Evaluation: 07/19/2021 ?time spent:30 minutes ? ?Chief Complaint:  ?Chief Complaint   ?Anxiety; Depression; Follow-up ?  ? ? ? ?HISTORY/CURRENT STATUS: ?HPI For routine med check. ? ?Last seen 06/07/2021, no changes in meds were made, but since her father-in-law had just passed away unexpectedly, we decided to follow up soon to make sure she was handling the grief okay.  She has been having a very hard time.  The overwhelming sadness hits out of the blue, she cries a lot, but she is most concerned about her husband.  Even before his dad passed away he was under a lot of stress at work.  He is considering changing jobs. [name] is not missing work.  Things for her have been stressful as well because her supervisor retired and they had worked together for well over 10 years.  Her energy and motivation are low but no different.  She is not sleeping as well but feels like it is because her husband is tossing and turning a lot.  Appetite is normal and weight is stable.  ADLs and personal hygiene are normal.  No suicidal or homicidal thoughts. ? ?Continues to have anxiety.  The Klonopin does help.  Has been obsessing about things more, cannot get her mind to shut off. ? ?Patient denies increased energy with decreased need for sleep, no increased talkativeness, no racing thoughts, no impulsivity or risky behaviors, no increased spending, no increased libido, no grandiosity, no increased irritability or anger, and no hallucinations. ? ?Denies dizziness, syncope, seizures, numbness, tingling, tremor, tics, unsteady gait, slurred speech, confusion. Denies muscle or joint pain, stiffness, or dystonia. ? ?Individual Medical History/ Review of Systems: Changes? :No    ? ? ?Past medications for mental health diagnoses include: ?Zoloft, Xanax, Wellbutrin, Lexapro, Effexor, Luvox, Cymbalta caused more anxiety then  depression ? ?Allergies: Wellbutrin [bupropion] and Adhesive [tape] ? ?Current Medications:  ?Current Outpatient Medications:  ?  acetaminophen (TYLENOL) 500 MG tablet, Take 1,000 mg by mouth 2 (two) times daily as needed. Rapid release, Disp: , Rfl:  ?  azaTHIOprine (IMURAN) 50 MG tablet, Take 125 mg by mouth daily., Disp: , Rfl:  ?  Cholecalciferol 25 MCG (1000 UT) tablet, Take by mouth., Disp: , Rfl:  ?  clonazePAM (KLONOPIN) 0.5 MG tablet, TAKE 1/2- 1 TABLET BY MOUTH three times DAILY AS NEEDED, Disp: 270 tablet, Rfl: 1 ?  cyanocobalamin (,VITAMIN B-12,) 1000 MCG/ML injection, Inject 1,000 mcg into the muscle every 30 (thirty) days., Disp: , Rfl:  ?  ferrous sulfate 325 (65 FE) MG tablet, Take by mouth., Disp: , Rfl:  ?  folic acid (FOLVITE) 1 MG tablet, Take 1 mg by mouth daily., Disp: , Rfl:  ?  ibuprofen (ADVIL) 600 MG tablet, Take 600 mg by mouth every 6 (six) hours as needed., Disp: , Rfl:  ?  inFLIXimab (REMICADE IV), Inject into the vein., Disp: , Rfl:  ?  lamoTRIgine (LAMICTAL) 200 MG tablet, Take 1 tablet (200 mg total) by mouth 2 (two) times daily., Disp: 180 tablet, Rfl: 0 ?  loratadine (CLARITIN) 10 MG tablet, Take 10 mg by mouth daily., Disp: , Rfl:  ?  Melatonin 10 MG TABS, Take by mouth., Disp: , Rfl:  ?  montelukast (SINGULAIR) 10 MG tablet, TAKE 1 TABLET(10 MG) BY MOUTH DAILY, Disp: , Rfl:  ?  norethindrone (AYGESTIN) 5 MG tablet, Take 5 mg by mouth daily.,  Disp: , Rfl:  ?  omeprazole (PRILOSEC) 40 MG capsule, Take 40 mg by mouth daily., Disp: , Rfl:  ?  rOPINIRole (REQUIP) 4 MG tablet, TAKE 2 TABLETS(8 MG) BY MOUTH AT BEDTIME (Patient taking differently: 1 mg.), Disp: 180 tablet, Rfl: 0 ?  sertraline (ZOLOFT) 100 MG tablet, TAKE 2 TABLETS BY MOUTH DAILY, CAN INCREASE TO 250MG BEFORE MENSES AS DIRECTED., Disp: 200 tablet, Rfl: 3 ?  tiZANidine (ZANAFLEX) 4 MG tablet, TAKE 1 TABLET(4 MG) BY MOUTH AT BEDTIME, Disp: 30 tablet, Rfl: 0 ?  fluconazole (DIFLUCAN) 150 MG tablet, Take 1 tablet by mouth as  needed. (Patient not taking: Reported on 07/19/2021), Disp: , Rfl:  ?  fluticasone (FLONASE) 50 MCG/ACT nasal spray, Place into the nose. (Patient not taking: Reported on 07/19/2021), Disp: , Rfl:  ?  meloxicam (MOBIC) 15 MG tablet, Take 1 tablet (15 mg total) by mouth daily. (Patient not taking: Reported on 07/19/2021), Disp: 30 tablet, Rfl: 0 ?Medication Side Effects: none ? ?Family Medical/ Social History: Changes? See hpi ? ?MENTAL HEALTH EXAM: ? ?There were no vitals taken for this visit.There is no height or weight on file to calculate BMI.  ?General Appearance: Casual, Well Groomed and Obese  ?Eye Contact:  Good  ?Speech:  Clear and Coherent and Normal Rate  ?Volume:  Normal  ?Mood:  Depressed  ?Affect:  Depressed and Tearful  ?Thought Process:  Goal Directed and Descriptions of Associations: Circumstantial  ?Orientation:  Full (Time, Place, and Person)  ?Thought Content: Logical   ?Suicidal Thoughts:  No  ?Homicidal Thoughts:  No  ?Memory:  WNL  ?Judgement:  Good  ?Insight:  Good  ?Psychomotor Activity:  Normal  ?Concentration:  Concentration: Good and Attention Span: Good  ?Recall:  Good  ?Fund of Knowledge: Good  ?Language: Good  ?Assets:  Desire for Improvement  ?ADL's:  Intact  ?Cognition: WNL  ?Prognosis:  Good  ? ? ?DIAGNOSES:  ?  ICD-10-CM   ?1. Grief  F43.21   ?  ?2. Generalized anxiety disorder  F41.1   ?  ?3. Major depressive disorder, recurrent episode, moderate (HCC)  F33.1   ?  ?4. Restless leg syndrome  G25.81   ?  ? ? ? ?Receiving Psychotherapy: No  ? ? ?RECOMMENDATIONS:  ?PDMP reviewed.  No results available ?I provided 30 minutes of face to face time during this encounter, including time spent before and after the visit in records review, medical decision making, counseling pertinent to today's visit, and charting.  ?We discussed grief and its effects both physical and mental.  Recommended Grief Share.  Her husband has already contacted hospice for counseling there. ?I recommend increasing  either the Lamictal or Zoloft.  She has responded most to the Lamictal when it was added several years ago.  She has been on this dose for quite a while.  She agrees to go up. ? ?Continue Ropinerole 1 mg p.o. nightly.   ?Continue Klonopin 0.5 mg, 1/2-1 tid prn ?Increase Lamictal to 200 mg, 1 p.o. twice daily. ?Cont Zoloft 100 mg, 2 qd. ?Continue MVI, Vit D, B Complex. ?Return in 4-6 weeks. ? ?[name], PA-C  ?

## 2021-08-22 NOTE — Progress Notes (Signed)
?Charlann Boxer D.O. ?[facility] ?[place] ?Phone: [phone] ?Subjective:   ?I, Jacqualin Combes, am serving as a scribe for Dr. Hulan Saas. ? ?This visit occurred during the SARS-CoV-2 public health emergency.  Safety protocols were in place, including screening questions prior to the visit, additional usage of staff PPE, and extensive cleaning of exam room while observing appropriate contact time as indicated for disinfecting solutions.  ?I'm seeing this patient by the request  of:  London Pepper, MD ? ?CC: Neck and back pain follow-up ? ?[id number]  ?Traci [name] is a 41 y.o. female coming in with complaint of back and neck pain. OMT 07/14/2021. Patient states that she is tight but otherwise no change since last visit.  Patient has had some stress recently.  Thinks that this could be contributing to more discomfort.  Has noticed that she is waking up more tight ? ?Medications patient has been prescribed: Zanaflex ? ?Taking: Yes ? ? ?  ? ? ? ? ?Reviewed prior external information including notes and imaging from previsou exam, outside providers and external EMR if available.  ? ?As well as notes that were available from care everywhere and other healthcare systems. ? ?Past medical history, social, surgical and family history all reviewed in electronic medical record.  No pertanent information unless stated regarding to the chief complaint.  ? ?Past Medical History:  ?Diagnosis Date  ? Anemia   ? Anxiety   ? Blood clot in vein   ? Right lower leg after surgery  ? Crohn's disease ([place])   ? Depression   ? Dyspnea   ? Elevated BP without diagnosis of hypertension   ? Enlarged liver   ? GERD (gastroesophageal reflux disease)   ? Headache   ? IBS (irritable bowel syndrome)   ? Laceration of left little finger 02/28/2018  ? Lower extremity edema   ? Restless legs   ? Seasonal allergies   ? Uterine fibroid   ? Vitamin B 12 deficiency   ? Vitamin D deficiency   ?  ?Allergies   ?Allergen Reactions  ? Wellbutrin [Bupropion] Anxiety  ? Adhesive [Tape]   ? ? ? ?Review of Systems: ? No headache, visual changes, nausea, vomiting, diarrhea, constipation, dizziness, abdominal pain, skin rash, fevers, chills, night sweats, weight loss, swollen lymph nodes, body aches, joint swelling, chest pain, shortness of breath, mood changes. POSITIVE muscle aches ? ?Objective  ?Blood pressure 98/62, pulse 94, height 5' 7"  (1.702 m), weight 284 lb (128.8 kg), SpO2 98 %. ?  ?General: No apparent distress alert and oriented x3 mood and affect normal, dressed appropriately.  ?HEENT: Pupils equal, extraocular movements intact  ?Respiratory: Patient's speak in full sentences and does not appear short of breath  ?Cardiovascular: No lower extremity edema, non tender, no erythema  ?Patient does have tightness noted.  Describes the pain as a in the soft tissue.  Patient is more tender out of proportion than what we would expect.  Negative Spurling's.  Tightness with FABER test.  Lacks 5 degrees in all planes of the lumbar spine. ? ?Osteopathic findings ? ?C4 flexed rotated and side bent right ?C5 flexed rotated and side bent left ?T3 extended rotated and side bent right inhaled rib ?T8 extended rotated and side bent left ?L2 flexed rotated and side bent left ?Sacrum right on right ? ? ? ? ?  ?Assessment and Plan: ? ?Low back pain ?Chronic tightness still noted.  Patient does have a mild exacerbation.  Discussed with patient about different treatment options.  Patient does have the Zanaflex and encouraged her to use it on a more regular basis with her having more night pain and does have some increase in anxiety recently.  Patient increase activity slowly otherwise.  Follow-up with me again in 6 to 8 weeks otherwise  ? ?Nonallopathic problems ? ?Decision today to treat with OMT was based on Physical Exam ? ?After verbal consent patient was treated with HVLA, ME, [name], rib, thoracic, lumbar, and  sacral  areas ? ?Patient tolerated the procedure well with improvement in symptoms ? ?Patient given exercises, stretches and lifestyle modifications ? ?See medications in patient instructions if given ? ?Patient will follow up in 4-8 weeks ? ?  ? ?The above documentation has been reviewed and is accurate and complete Lyndal Pulley, DO ? ? ? ?  ? ? Note: This dictation was prepared with Dragon dictation along with smaller phrase technology. Any transcriptional errors that result from this process are unintentional.    ?  ?  ? ?

## 2021-08-23 ENCOUNTER — Ambulatory Visit (INDEPENDENT_AMBULATORY_CARE_PROVIDER_SITE_OTHER): Payer: BC Managed Care – PPO | Admitting: Family Medicine

## 2021-08-23 ENCOUNTER — Encounter: Payer: Self-pay | Admitting: Family Medicine

## 2021-08-23 VITALS — BP 98/62 | HR 94 | Ht 67.0 in | Wt 284.0 lb

## 2021-08-23 DIAGNOSIS — M9901 Segmental and somatic dysfunction of cervical region: Secondary | ICD-10-CM | POA: Diagnosis not present

## 2021-08-23 DIAGNOSIS — M9903 Segmental and somatic dysfunction of lumbar region: Secondary | ICD-10-CM | POA: Diagnosis not present

## 2021-08-23 DIAGNOSIS — M9904 Segmental and somatic dysfunction of sacral region: Secondary | ICD-10-CM | POA: Diagnosis not present

## 2021-08-23 DIAGNOSIS — M545 Low back pain, unspecified: Secondary | ICD-10-CM

## 2021-08-23 DIAGNOSIS — M9908 Segmental and somatic dysfunction of rib cage: Secondary | ICD-10-CM | POA: Diagnosis not present

## 2021-08-23 DIAGNOSIS — M9902 Segmental and somatic dysfunction of thoracic region: Secondary | ICD-10-CM

## 2021-08-23 NOTE — Patient Instructions (Signed)
Good to see you! ?Take care of yourself ?Take muscle relaxer the next 3 nights ?See you again in 7-8 weeks ?

## 2021-08-23 NOTE — Assessment & Plan Note (Signed)
Chronic tightness still noted.  Patient does have a mild exacerbation.  Discussed with patient about different treatment options.  Patient does have the Zanaflex and encouraged her to use it on a more regular basis with her having more night pain and does have some increase in anxiety recently.  Patient increase activity slowly otherwise.  Follow-up with me again in 6 to 8 weeks otherwise ?

## 2021-08-24 ENCOUNTER — Encounter: Payer: Self-pay | Admitting: Physician Assistant

## 2021-08-24 ENCOUNTER — Ambulatory Visit: Payer: BC Managed Care – PPO | Admitting: Physician Assistant

## 2021-08-24 DIAGNOSIS — G2581 Restless legs syndrome: Secondary | ICD-10-CM | POA: Diagnosis not present

## 2021-08-24 DIAGNOSIS — F3341 Major depressive disorder, recurrent, in partial remission: Secondary | ICD-10-CM | POA: Diagnosis not present

## 2021-08-24 DIAGNOSIS — F4321 Adjustment disorder with depressed mood: Secondary | ICD-10-CM | POA: Diagnosis not present

## 2021-08-24 DIAGNOSIS — F411 Generalized anxiety disorder: Secondary | ICD-10-CM | POA: Diagnosis not present

## 2021-08-24 NOTE — Progress Notes (Signed)
[facility] ? ?Patient ID: Traci [name],  ?MRN: [id number] ? ?PCP: London Pepper, MD ? ?Date of Evaluation: 08/24/2021 ?time spent:20 minutes ? ?Chief Complaint:  ?Chief Complaint   ?Depression; Anxiety; Follow-up ?  ? ? ?HISTORY/CURRENT STATUS: ?HPI For routine med check. ? ?Six weeks ago we increased Lamictal. She feels like it might be helping some. Continues to grieve for her father-in-law's death which is playing a huge part in her mood.  She and her husband are still trying to figure out if they are going to be able to have a baby or not, she is on Aygestin for fibroids and will have another ultrasound in a week or so, hopefully the fibroids have decreased in size.  She may have to go to an IVF specialist.  Just having a difficult time dealing with all that and what it entails. ? ?Energy and motivation are good most of the time.  Able to enjoy some things but again depends on the mood and how much grief she is feeling.  She is not missing work.  ADLs and personal hygiene are normal.  Cries easily sometimes when thinking about her father-in-law or whether they may have kids or not.  Not isolating.  Anxiety is controlled with Klonopin.  Not having panic attacks but more of a generalized sense of unease of something bad about to happen.  No suicidal or homicidal thoughts. ? ?Patient denies increased energy with decreased need for sleep, no increased talkativeness, no racing thoughts, no impulsivity or risky behaviors, no increased spending, no increased libido, no grandiosity, no increased irritability or anger, and no hallucinations. ? ?Denies dizziness, syncope, seizures, numbness, tingling, tremor, tics, unsteady gait, slurred speech, confusion. Denies muscle or joint pain, stiffness, or dystonia. ? ?Individual Medical History/ Review of Systems: Changes? :Yes   see HPI.  Also went to the ER a few weeks ago for severe abdominal pain.  Has Crohn's disease but thankfully it was not related to  that, was found to be severely constipated. ? ? ?Past medications for mental health diagnoses include: ?Zoloft, Xanax, Wellbutrin, Lexapro, Effexor, Luvox, Cymbalta caused more anxiety then depression ? ?Allergies: Wellbutrin [bupropion] and Adhesive [tape] ? ?Current Medications:  ?Current Outpatient Medications:  ?  acetaminophen (TYLENOL) 500 MG tablet, Take 1,000 mg by mouth 2 (two) times daily as needed. Rapid release, Disp: , Rfl:  ?  azaTHIOprine (IMURAN) 50 MG tablet, Take 125 mg by mouth daily., Disp: , Rfl:  ?  Cholecalciferol 25 MCG (1000 UT) tablet, Take by mouth., Disp: , Rfl:  ?  clonazePAM (KLONOPIN) 0.5 MG tablet, TAKE 1/2- 1 TABLET BY MOUTH three times DAILY AS NEEDED, Disp: 270 tablet, Rfl: 1 ?  cyanocobalamin (,VITAMIN B-12,) 1000 MCG/ML injection, Inject 1,000 mcg into the muscle every 30 (thirty) days., Disp: , Rfl:  ?  ferrous sulfate 325 (65 FE) MG tablet, Take by mouth., Disp: , Rfl:  ?  fluconazole (DIFLUCAN) 150 MG tablet, Take 1 tablet by mouth as needed., Disp: , Rfl:  ?  fluticasone (FLONASE) 50 MCG/ACT nasal spray, Place into the nose., Disp: , Rfl:  ?  folic acid (FOLVITE) 1 MG tablet, Take 1 mg by mouth daily., Disp: , Rfl:  ?  ibuprofen (ADVIL) 600 MG tablet, Take 600 mg by mouth every 6 (six) hours as needed., Disp: , Rfl:  ?  inFLIXimab (REMICADE IV), Inject into the vein., Disp: , Rfl:  ?  lamoTRIgine (LAMICTAL) 200 MG tablet, Take 1 tablet (200 mg total) by  mouth 2 (two) times daily., Disp: 180 tablet, Rfl: 0 ?  loratadine (CLARITIN) 10 MG tablet, Take 10 mg by mouth daily., Disp: , Rfl:  ?  Melatonin 10 MG TABS, Take by mouth., Disp: , Rfl:  ?  montelukast (SINGULAIR) 10 MG tablet, TAKE 1 TABLET(10 MG) BY MOUTH DAILY, Disp: , Rfl:  ?  norethindrone (AYGESTIN) 5 MG tablet, Take 5 mg by mouth daily., Disp: , Rfl:  ?  omeprazole (PRILOSEC) 40 MG capsule, Take 40 mg by mouth daily., Disp: , Rfl:  ?  rOPINIRole (REQUIP) 4 MG tablet, TAKE 2 TABLETS(8 MG) BY MOUTH AT BEDTIME (Patient  taking differently: 1 mg.), Disp: 180 tablet, Rfl: 0 ?  sertraline (ZOLOFT) 100 MG tablet, TAKE 2 TABLETS BY MOUTH DAILY, CAN INCREASE TO 250MG BEFORE MENSES AS DIRECTED. (Patient taking differently: 200 mg.), Disp: 200 tablet, Rfl: 3 ?  tiZANidine (ZANAFLEX) 4 MG tablet, TAKE 1 TABLET(4 MG) BY MOUTH AT BEDTIME, Disp: 30 tablet, Rfl: 0 ?Medication Side Effects: none ? ?Family Medical/ Social History: Changes? See hpi ? ?MENTAL HEALTH EXAM: ? ?There were no vitals taken for this visit.There is no height or weight on file to calculate BMI.  ?General Appearance: Casual, Well Groomed and Obese  ?Eye Contact:  Good  ?Speech:  Clear and Coherent and Normal Rate  ?Volume:  Normal  ?Mood:  Euthymic  ?Affect:  Congruent  ?Thought Process:  Goal Directed and Descriptions of Associations: Circumstantial  ?Orientation:  Full (Time, Place, and Person)  ?Thought Content: Logical   ?Suicidal Thoughts:  No  ?Homicidal Thoughts:  No  ?Memory:  WNL  ?Judgement:  Good  ?Insight:  Good  ?Psychomotor Activity:  Normal  ?Concentration:  Concentration: Good and Attention Span: Good  ?Recall:  Good  ?Fund of Knowledge: Good  ?Language: Good  ?Assets:  Desire for Improvement  ?ADL's:  Intact  ?Cognition: WNL  ?Prognosis:  Good  ? ? ?DIAGNOSES:  ?  ICD-10-CM   ?1. Recurrent major depressive disorder, in partial remission (HCC)  F33.41   ?  ?2. Generalized anxiety disorder  F41.1   ?  ?3. Restless leg syndrome  G25.81   ?  ?4. Grief  F43.21   ?  ? ? ? ? ?Receiving Psychotherapy: No  ? ? ?RECOMMENDATIONS:  ?PDMP reviewed.  No results available ?I provided 20 minutes of face to face time during this encounter, including time spent before and after the visit in records review, medical decision making, counseling pertinent to today's visit, and charting.  ?Doing well as far as meds go so no changes will be made. ? ?Continue Ropinerole 1 mg p.o. nightly.   ?Continue Klonopin 0.5 mg, 1/2-1 tid prn ?Continue Lamictal to 200 mg, 1 p.o. twice  daily. ?Cont Zoloft 100 mg, 2 qd. ?Continue MVI, Vit D, B Complex. ?Consider counseling. ?Return in 3 months.  ? ?[name], PA-C  ?

## 2021-10-06 ENCOUNTER — Other Ambulatory Visit: Payer: Self-pay | Admitting: Physician Assistant

## 2021-10-12 ENCOUNTER — Ambulatory Visit: Payer: BC Managed Care – PPO | Admitting: Family Medicine

## 2021-11-10 NOTE — Progress Notes (Signed)
[facility] Tr[name] Phone: [phone] Subjective:   Traci Traci [name], am serving as a scribe for Dr. Hulan Saas.   I'm seeing this patient by the request  of:  Traci Pepper, MD  CC: neck pain f/u   [id number]  Traci Traci [name] is a 41 y.o. female coming in with complaint of back and neck pain. OMT on 08/23/2021. Patient states that she is using mm relaxer prn. Neck is doing much better than last visit.   Also mentions numbness on back of her R hand. Nerve pain when she runs her finger from the forearm to the wrist. Patient does like to crochet and has increased time of phone.   Medications patient has been prescribed: Zanaflex  Taking:       Reviewed prior external information including notes and imaging from previsou exam, outside providers and external EMR if available.   As well as notes that were available from care everywhere and other healthcare systems.  Patient is getting infusions for her Crohn's disease  Past medical history, social, surgical and family history all reviewed in electronic medical record.  Traci [name] pertanent information unless stated regarding to the chief complaint.   Past Medical History:  Diagnosis Date   Anemia    Anxiety    Blood clot in vein    Right lower leg after surgery   Crohn's disease (HCC)    Depression    Dyspnea    Elevated BP without diagnosis of hypertension    Enlarged liver    GERD (gastroesophageal reflux disease)    Headache    IBS (irritable bowel syndrome)    Laceration of left little finger 02/28/2018   Lower extremity edema    Restless legs    Seasonal allergies    Uterine fibroid    Vitamin B 12 deficiency    Vitamin D deficiency     Allergies  Allergen Reactions   Wellbutrin [Bupropion] Anxiety   Adhesive [Tape]      Review of Systems:  Traci [name] headache, visual changes, nausea, vomiting, diarrhea, constipation, dizziness, abdominal pain, skin rash,  fevers, chills, night sweats, weight loss, swollen lymph nodes, body aches, joint swelling, chest pain, shortness of breath, mood changes. POSITIVE muscle aches  Objective  Blood pressure 122/86, pulse 92, height 5' 7"  (1.702 m), weight 288 lb (130.6 kg), SpO2 98 %.   General: Traci [name] apparent distress alert and oriented x3 mood and affect normal, [name], extraocular movements intact  Respiratory: Patient's speak in full sentences and does not appear short of breath  Cardiovascular: Traci [name] lower extremity edema, non tender, Traci [name] erythema  Gait normal Right wrist exam shows some mild swelling compared to the contralateral side especially of the ECU.  Positive audible popping noted with the ECU.  Patient is able to make good grip strength.  Good capillary refill otherwise. MSK:  Back continues to have scapular dyskinesis.  Tightness noted in the right parascapular region compared to the left.  Tenderness to palpation in the thoracolumbar juncture also noted.  Osteopathic findings  C2 flexed rotated and side bent right C6 flexed rotated and side bent left T3 extended rotated and side bent right inhaled rib T9 extended rotated and side bent left L2 flexed rotated and side bent right Sacrum right on right   Limited muscular skeletal ultrasound was performed and interpreted by Hulan Saas, M  Limited ultrasound shows the patient does have hypoechoic changes within the  tendon sheath in the fifth, third dorsal compartments of the wrist.  Patient has significant increase in Doppler flow around the ECU.  Does appear to have some mild chronic subluxation of the tendon as well. Impression: ECU subluxation and diffuse tendinitis    Assessment and Plan:  Scapular dyskinesis Continues to have difficulty.  Continuing to work on weight and posture.  We discussed ergonomics again.  Patient is trying to not use too many medications.  We have given Zanaflex and can use it if  necessary.  Been sometime she states.  Discussed icing regimen and home exercises otherwise.  Follow-up again in 6 to 8 weeks  Extensor carpi ulnaris tendinitis More of a tendinitis.  Patient noted does have hypoechoic changes on ultrasound that is concerning for potential autoimmune flare secondary to potentially patient's Crohn's disease.  Patient would like to avoid prednisone.  We will do bracing and home exercises discussed counterforce taping could also be beneficial for repetitive activities that she does.  Follow-up again in 6 to 8 weeks worsening pain can consider formal physical therapy    Nonallopathic problems  Decision today to treat with OMT was based on Physical Exam  After verbal consent patient was treated with HVLA, ME, [name], rib, thoracic, lumbar, [name], stretches and lifestyle modifications  See medications in patient instructions if given  Patient will follow up in 4-8 weeks     The above documentation has been reviewed and is accurate and complete Lyndal Pulley, DO         Note: This dictation was prepared with Dragon dictation along with smaller phrase technology. Any transcriptional errors that result from this process are unintentional.

## 2021-11-16 ENCOUNTER — Encounter: Payer: Self-pay | Admitting: Family Medicine

## 2021-11-16 ENCOUNTER — Ambulatory Visit: Payer: BC Managed Care – PPO | Admitting: Family Medicine

## 2021-11-16 ENCOUNTER — Ambulatory Visit: Payer: Self-pay

## 2021-11-16 VITALS — BP 122/86 | HR 92 | Ht 67.0 in | Wt 288.0 lb

## 2021-11-16 DIAGNOSIS — M778 Other enthesopathies, not elsewhere classified: Secondary | ICD-10-CM | POA: Diagnosis not present

## 2021-11-16 DIAGNOSIS — M79601 Pain in right arm: Secondary | ICD-10-CM

## 2021-11-16 DIAGNOSIS — M9903 Segmental and somatic dysfunction of lumbar region: Secondary | ICD-10-CM | POA: Diagnosis not present

## 2021-11-16 DIAGNOSIS — M9904 Segmental and somatic dysfunction of sacral region: Secondary | ICD-10-CM | POA: Diagnosis not present

## 2021-11-16 DIAGNOSIS — M9901 Segmental and somatic dysfunction of cervical region: Secondary | ICD-10-CM | POA: Diagnosis not present

## 2021-11-16 DIAGNOSIS — G2589 Other specified extrapyramidal and movement disorders: Secondary | ICD-10-CM

## 2021-11-16 DIAGNOSIS — M9908 Segmental and somatic dysfunction of rib cage: Secondary | ICD-10-CM

## 2021-11-16 DIAGNOSIS — M9902 Segmental and somatic dysfunction of thoracic region: Secondary | ICD-10-CM

## 2021-11-16 NOTE — Patient Instructions (Signed)
Thumb spica brace [place] for crocheting  Ice Exercises See me in 8 weeks

## 2021-11-17 DIAGNOSIS — M778 Other enthesopathies, not elsewhere classified: Secondary | ICD-10-CM | POA: Insufficient documentation

## 2021-11-17 NOTE — Assessment & Plan Note (Signed)
More of a tendinitis.  Patient noted does have hypoechoic changes on ultrasound that is concerning for potential autoimmune flare secondary to potentially patient's Crohn's disease.  Patient would like to avoid prednisone.  We will do bracing and home exercises discussed counterforce taping could also be beneficial for repetitive activities that she does.  Follow-up again in 6 to 8 weeks worsening pain can consider formal physical therapy

## 2021-11-17 NOTE — Assessment & Plan Note (Signed)
Continues to have difficulty.  Continuing to work on weight and posture.  We discussed ergonomics again.  Patient is trying to not use too many medications.  We have given Zanaflex and can use it if necessary.  Been sometime she states.  Discussed icing regimen and home exercises otherwise.  Follow-up again in 6 to 8 weeks

## 2021-11-24 ENCOUNTER — Ambulatory Visit: Payer: BC Managed Care – PPO | Admitting: Physician Assistant

## 2021-11-24 ENCOUNTER — Encounter: Payer: Self-pay | Admitting: Physician Assistant

## 2021-11-24 DIAGNOSIS — F411 Generalized anxiety disorder: Secondary | ICD-10-CM | POA: Diagnosis not present

## 2021-11-24 DIAGNOSIS — G2581 Restless legs syndrome: Secondary | ICD-10-CM

## 2021-11-24 DIAGNOSIS — F4321 Adjustment disorder with depressed mood: Secondary | ICD-10-CM | POA: Diagnosis not present

## 2021-11-24 DIAGNOSIS — F3341 Major depressive disorder, recurrent, in partial remission: Secondary | ICD-10-CM

## 2021-11-24 MED ORDER — CLONAZEPAM 0.5 MG PO TABS: ORAL_TABLET | ORAL | 1 refills | Status: DC

## 2021-11-24 NOTE — Progress Notes (Signed)
[facility]  Patient ID: Traci [name],  MRN: [id number]  PCP: London Pepper, MD  Date of Evaluation: 11/24/2021 time spent:20 minutes  Chief Complaint:  Chief Complaint   Anxiety; Depression; Follow-up    HISTORY/CURRENT STATUS: HPI For routine med check.  Is doing as well as she can. Feels that meds are working well. Still grieving, her FIL who died in 06-10-2022. Her MIL started dating 5 weeks after he died. That's been stressful, she hasn't really been a supportive mother ever and it just seems really weird and hurtful that she would act like this right after he died.  Patient denies loss of interest in usual activities and is able to enjoy things.  Denies decreased energy.  Denies decreased motivation.  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  ADLs and personal hygiene are normal.  Appetite has not changed.  Weight is stable.  No extreme sadness but she does get really sad when thinking about the loss of her father-in-law, cries with grief at times but denies feelings of hopelessness.  Sleeps well most of the time.  Denies any changes in concentration, making decisions or remembering things.  Denies suicidal or homicidal thoughts.  Still has anxiety, not usually having panic attacks but more of a generalized sense of unease, like something bad may happen at any time.  The Klonopin is very helpful.  She usually only takes Klonopin twice daily but sometimes she will take an extra half pill during the day.  Not too often though.  Patient denies increased energy with decreased need for sleep, no increased talkativeness, no racing thoughts, no impulsivity or risky behaviors, no increased spending, no increased libido, no grandiosity, no increased irritability or anger, no paranoia, and no hallucinations.  Denies dizziness, syncope, seizures, numbness, tingling, tremor, tics, unsteady gait, slurred speech,  confusion. Denies muscle or joint pain, stiffness, or dystonia.   Individual Medical History/ Review of Systems: Changes? :No      Past medications for mental health diagnoses include: Zoloft, Xanax, Wellbutrin, Lexapro, Effexor, Luvox, Cymbalta caused more anxiety then depression  Allergies: Wellbutrin [bupropion] and Adhesive [tape]  Current Medications:  Current Outpatient Medications:    acetaminophen (TYLENOL) 500 MG tablet, Take 1,000 mg by mouth 2 (two) times daily as needed. Rapid release, Disp: , Rfl:    azaTHIOprine (IMURAN) 50 MG tablet, Take 125 mg by mouth daily., Disp: , Rfl:    Cholecalciferol 25 MCG (1000 UT) tablet, Take by mouth., Disp: , Rfl:    cyanocobalamin (,VITAMIN B-12,) 1000 MCG/ML injection, Inject 1,000 mcg into the muscle every 30 (thirty) days., Disp: , Rfl:    ferrous sulfate 325 (65 FE) MG tablet, Take by mouth., Disp: , Rfl:    fluticasone (FLONASE) 50 MCG/ACT nasal spray, Place into the nose., Disp: , Rfl:    folic acid (FOLVITE) 1 MG tablet, Take 1 mg by mouth daily., Disp: , Rfl:    ibuprofen (ADVIL) 600 MG tablet, Take 600 mg by mouth every 6 (six) hours as needed., Disp: , Rfl:    inFLIXimab (REMICADE IV), Inject into the vein., Disp: , Rfl:    lamoTRIgine (LAMICTAL) 200 MG tablet, TAKE 1 TABLET(200 MG) BY MOUTH TWICE DAILY, Disp: 180 tablet, Rfl: 0   loratadine (CLARITIN) 10 MG tablet, Take 10 mg by mouth daily., Disp: , Rfl:    Melatonin 10 MG TABS, Take by mouth., Disp: , Rfl:    montelukast (  SINGULAIR) 10 MG tablet, TAKE 1 TABLET(10 MG) BY MOUTH DAILY, Disp: , Rfl:    norethindrone (AYGESTIN) 5 MG tablet, Take 5 mg by mouth daily., Disp: , Rfl:    omeprazole (PRILOSEC) 40 MG capsule, Take 40 mg by mouth daily., Disp: , Rfl:    rOPINIRole (REQUIP) 4 MG tablet, TAKE 2 TABLETS(8 MG) BY MOUTH AT BEDTIME (Patient taking differently: 1 mg.), Disp: 180 tablet, Rfl: 0   sertraline (ZOLOFT) 100 MG tablet, TAKE 2 TABLETS BY MOUTH DAILY, CAN INCREASE TO 250MG  BEFORE MENSES AS DIRECTED. (Patient taking differently: 200 mg.), Disp: 200 tablet, Rfl: 3   tiZANidine (ZANAFLEX) 4 MG tablet, TAKE 1 TABLET(4 MG) BY MOUTH AT BEDTIME, Disp: 30 tablet, Rfl: 0   clonazePAM (KLONOPIN) 0.5 MG tablet, TAKE 1/2- 1 TABLET BY MOUTH three times DAILY AS NEEDED, Disp: 270 tablet, Rfl: 1 Medication Side Effects: none  Family Medical/ Social History: Changes? See hpi  MENTAL HEALTH EXAM:  There were no vitals taken for this visit.There is no height or weight on file to calculate BMI.  General Appearance: Casual, Well Groomed and Obese  Eye Contact:  Good  Speech:  Clear and Coherent and Normal Rate  Volume:  Normal  Mood:  Euthymic  Affect:  Congruent  Thought Process:  Goal Directed and Descriptions of Associations: Circumstantial  Orientation:  Full (Time, Place, and Person)  Thought Content: Logical   Suicidal Thoughts:  No  Homicidal Thoughts:  No  Memory:  WNL  Judgement:  Good  Insight:  Good  Psychomotor Activity:  Normal  Concentration:  Concentration: Good and Attention Span: Good  Recall:  Good  Fund of Knowledge: Good  Language: Good  Assets:  Desire for Improvement Financial Resources/Insurance Housing Transportation Vocational/Educational  ADL's:  Intact  Cognition: WNL  Prognosis:  Good    DIAGNOSES:    ICD-10-CM   1. Recurrent major depressive disorder, in partial remission ([place])  F33.41     2. Grief  F43.21     3. Generalized anxiety disorder  F41.1     4. Restless leg syndrome  G25.81       Receiving Psychotherapy: No    RECOMMENDATIONS:  PDMP reviewed.  No results available I provided 20 minutes of face to face time during this encounter, including time spent before and after the visit in records review, medical decision making, counseling pertinent to today's visit, and charting.  Doing well with meds so no change in treatment.   Continue Klonopin 0.5 mg, 1/2-1 tid prn Continue Lamictal to 200 mg, 1 p.o. twice  daily. Continue Ropinerole 1 mg p.o. nightly.   Cont Zoloft 100 mg, 2 qd. Continue MVI, Vit D, B Complex. Consider counseling, Dr. Lavella Lemons at [phone] or with [facility] at [phone]. Return in 3 months.   [name], PA-C

## 2021-11-29 ENCOUNTER — Encounter: Payer: Self-pay | Admitting: Family Medicine

## 2021-11-29 ENCOUNTER — Other Ambulatory Visit: Payer: Self-pay

## 2021-11-29 MED ORDER — TIZANIDINE HCL 4 MG PO TABS: ORAL_TABLET | ORAL | 0 refills | Status: DC

## 2021-12-13 ENCOUNTER — Encounter (INDEPENDENT_AMBULATORY_CARE_PROVIDER_SITE_OTHER): Payer: Self-pay

## 2021-12-18 NOTE — Telephone Encounter (Signed)
Pt. message

## 2022-01-03 ENCOUNTER — Other Ambulatory Visit: Payer: Self-pay | Admitting: Physician Assistant

## 2022-01-03 NOTE — Progress Notes (Signed)
[facility] [place] [address] [place] Phone: [phone] Subjective:   Traci [name], am serving as a scribe for Dr. Hulan Saas.  I'm seeing this patient by the request  of:  London Pepper, MD  CC: Low back pain follow-up  [id number]  Traci [name] is a 41 y.o. female coming in with complaint of back and neck pain. OMT 11/16/2021. Patient states doing well. Had to stop crocheting and has been wearing brace to help with wrist pain. No new issues.   Medications patient has been prescribed: Zanaflex  Taking:         Reviewed prior external information including notes and imaging from previsou exam, outside providers and external EMR if available.   As well as notes that were available from care everywhere and other healthcare systems.  Past medical history, social, surgical and family history all reviewed in electronic medical record.  No pertanent information unless stated regarding to the chief complaint.   Past Medical History:  Diagnosis Date   Anemia    Anxiety    Blood clot in vein    Right lower leg after surgery   Crohn's disease (HCC)    Depression    Dyspnea    Elevated BP without diagnosis of hypertension    Enlarged liver    GERD (gastroesophageal reflux disease)    Headache    IBS (irritable bowel syndrome)    Laceration of left little finger 02/28/2018   Lower extremity edema    Restless legs    Seasonal allergies    Uterine fibroid    Vitamin B 12 deficiency    Vitamin D deficiency     Allergies  Allergen Reactions   Wellbutrin [Bupropion] Anxiety   Adhesive [Tape]      Review of Systems:  No headache, visual changes, nausea, vomiting, diarrhea, constipation, dizziness, abdominal pain, skin rash, fevers, chills, night sweats, weight loss, swollen lymph nodes, body aches, joint swelling, chest pain, shortness of breath, mood changes. POSITIVE muscle aches  Objective  Blood pressure 118/84,  pulse 93, height 5' 7"  (1.702 m), weight 285 lb (129.3 kg), SpO2 98 %.   General: No apparent distress alert and oriented x3 mood and affect normal, [name], [name], non tender, no erythema  Gait normal  MSK:  Back low back exam does have some loss of lordosis.  Some mild tightness noted in the thoracolumbar juncture but no significant CVA tenderness noted.  Patient does have tightness noted in the paraspinal musculature sacroiliac joint right greater than left.  Osteopathic findings  C2 flexed rotated and side bent right C7 flexed rotated and side bent left T3 extended rotated and side bent right inhaled rib T9 extended rotated and side bent left L5 flexed rotated and side bent left Sacrum right on right       Assessment and Plan:  Low back pain Chronic issue with mild exacerbation.  Discussed icing regimen and home exercises.  Discussed which activities to do which ones to avoid.  Continue to work on core strengthening.  Does have underlying chronic disease that could be contributing.  May need to consider the possibility of laboratory work-up if this continues.  Follow-up with me again in 6 to 8 weeks.  Patient does state she did need a Zanaflex which she has for breakthrough pain  Nonallopathic problems  Decision today to treat with OMT was based on Physical Exam  After verbal consent patient was treated with HVLA, ME, [name], rib, thoracic, lumbar, [name], stretches and lifestyle modifications  See medications in patient instructions if given  Patient will follow up in 4-8 weeks    The above documentation has been reviewed and is accurate and complete Lyndal Pulley, DO          Note: This dictation was prepared with Dragon dictation along with smaller phrase technology. Any transcriptional errors that result from this process are unintentional.

## 2022-01-10 ENCOUNTER — Ambulatory Visit: Payer: BC Managed Care – PPO | Admitting: Family Medicine

## 2022-01-10 VITALS — BP 118/84 | HR 93 | Ht 67.0 in | Wt 285.0 lb

## 2022-01-10 DIAGNOSIS — M9908 Segmental and somatic dysfunction of rib cage: Secondary | ICD-10-CM

## 2022-01-10 DIAGNOSIS — M9901 Segmental and somatic dysfunction of cervical region: Secondary | ICD-10-CM

## 2022-01-10 DIAGNOSIS — M9902 Segmental and somatic dysfunction of thoracic region: Secondary | ICD-10-CM

## 2022-01-10 DIAGNOSIS — M545 Low back pain, unspecified: Secondary | ICD-10-CM

## 2022-01-10 DIAGNOSIS — M9904 Segmental and somatic dysfunction of sacral region: Secondary | ICD-10-CM

## 2022-01-10 DIAGNOSIS — M9903 Segmental and somatic dysfunction of lumbar region: Secondary | ICD-10-CM | POA: Diagnosis not present

## 2022-01-10 NOTE — Patient Instructions (Addendum)
When you start crocheting again use [employer] Good to see you!

## 2022-01-10 NOTE — Assessment & Plan Note (Signed)
Chronic issue with mild exacerbation.  Discussed icing regimen and home exercises.  Discussed which activities to do which ones to avoid.  Continue to work on core strengthening.  Does have underlying chronic disease that could be contributing.  May need to consider the possibility of laboratory work-up if this continues.  Follow-up with me again in 6 to 8 weeks.  Patient does state she did need a Zanaflex which she has for breakthrough pain

## 2022-02-23 NOTE — Progress Notes (Unsigned)
Traci [name] [place] [address] [place] Phone: [phone] Subjective:   [name], am serving as a scribe for Dr. Hulan Saas.  I'm seeing this patient by the request  of:  London Pepper, MD  CC: Low back and neck pain follow-up  [id number]  Amey Hossain is a 41 y.o. female coming in with complaint of back and neck pain. OMT on 01/10/2022. Patient states same per usual.  Has not noticed more tightness on the left side of the neck.  Has not had any limited range of motion.  Does not know exactly what of potentially could have contributed to it.  Medications patient has been prescribed: None  Taking:         Reviewed prior external information including notes and imaging from previsou exam, outside providers and external EMR if available.   As well as notes that were available from care everywhere and other healthcare systems.  Past medical history, social, surgical and family history all reviewed in electronic medical record.  No pertanent information unless stated regarding to the chief complaint.   Past Medical History:  Diagnosis Date   Anemia    Anxiety    Blood clot in vein    Right lower leg after surgery   Crohn's disease (HCC)    Depression    Dyspnea    Elevated BP without diagnosis of hypertension    Enlarged liver    GERD (gastroesophageal reflux disease)    Headache    IBS (irritable bowel syndrome)    Laceration of left little finger 02/28/2018   Lower extremity edema    Restless legs    Seasonal allergies    Uterine fibroid    Vitamin B 12 deficiency    Vitamin D deficiency     Allergies  Allergen Reactions   Wellbutrin [Bupropion] Anxiety   Adhesive [Tape]      Review of Systems:  No, visual changes, nausea, vomiting, diarrhea, constipation, dizziness, abdominal pain, skin rash, fevers, chills, night sweats, weight loss, swollen lymph nodes, , joint swelling, chest pain, shortness of  breath, mood changes. POSITIVE muscle aches, body aches, headache  Objective  Blood pressure 116/82, pulse (!) 103, height 5' 7"  (1.702 m), weight 284 lb (128.8 kg), SpO2 95 %.   General: No apparent distress alert and oriented x3 mood and affect normal, [name], [name], non tender, no erythema  Significant tightness noted in the left trapezius as well as over the left paraspinal musculature.  Patient does have tightness of the lower back noted as well.  Poor core strength noted.  Osteopathic findings  C6 flexed rotated and side bent left T3 extended rotated and side bent left inhaled rib T7 extended rotated and side bent left inhaled rib L2 flexed rotated and side bent right  Sacrum right on right       Assessment and Plan:  Scapular dyskinesis Continued difficulty with this.  Seems to be more on the left than the right at the moment.  Patient has been doing more repetitive activity.  We discussed the Zanaflex 4 mg to take at night.  Discussed which activities to do and which ones to avoid.  Increase activity slowly over the course the next several weeks.  Discussed icing regimen.  Responds well to osteopathic manipulation today.  Follow-up again  in 8 weeks for further evaluation of patient's chronic problem.    Nonallopathic problems  Decision today to treat with OMT was based on Physical Exam  After verbal consent patient was treated with HVLA, ME, [name], rib, thoracic, lumbar, [name], stretches and lifestyle modifications  See medications in patient instructions if given  Patient will follow up in 4-8 weeks    The above documentation has been reviewed and is accurate  and complete Lyndal Pulley, DO

## 2022-02-28 ENCOUNTER — Ambulatory Visit: Payer: BC Managed Care – PPO | Admitting: Physician Assistant

## 2022-02-28 ENCOUNTER — Encounter: Payer: Self-pay | Admitting: Physician Assistant

## 2022-02-28 ENCOUNTER — Ambulatory Visit: Payer: BC Managed Care – PPO | Admitting: Family Medicine

## 2022-02-28 VITALS — BP 116/82 | HR 103 | Ht 67.0 in | Wt 284.0 lb

## 2022-02-28 DIAGNOSIS — M9904 Segmental and somatic dysfunction of sacral region: Secondary | ICD-10-CM

## 2022-02-28 DIAGNOSIS — G2581 Restless legs syndrome: Secondary | ICD-10-CM | POA: Diagnosis not present

## 2022-02-28 DIAGNOSIS — F4323 Adjustment disorder with mixed anxiety and depressed mood: Secondary | ICD-10-CM | POA: Diagnosis not present

## 2022-02-28 DIAGNOSIS — M9903 Segmental and somatic dysfunction of lumbar region: Secondary | ICD-10-CM

## 2022-02-28 DIAGNOSIS — M9901 Segmental and somatic dysfunction of cervical region: Secondary | ICD-10-CM

## 2022-02-28 DIAGNOSIS — M9902 Segmental and somatic dysfunction of thoracic region: Secondary | ICD-10-CM

## 2022-02-28 DIAGNOSIS — M9908 Segmental and somatic dysfunction of rib cage: Secondary | ICD-10-CM | POA: Diagnosis not present

## 2022-02-28 DIAGNOSIS — F4321 Adjustment disorder with depressed mood: Secondary | ICD-10-CM | POA: Diagnosis not present

## 2022-02-28 DIAGNOSIS — G2589 Other specified extrapyramidal and movement disorders: Secondary | ICD-10-CM

## 2022-02-28 MED ORDER — SERTRALINE HCL 100 MG PO TABS
250.0000 mg | ORAL_TABLET | Freq: Every day | ORAL | 1 refills | Status: DC
Start: 2022-02-28 — End: 2022-08-10

## 2022-02-28 NOTE — Progress Notes (Signed)
[facility]  Patient ID: Traci [name],  MRN: [id number]  PCP: London Pepper, MD  Date of Evaluation: 02/28/2022 time spent:20 minutes  Chief Complaint:  Chief Complaint   Anxiety; Depression; Follow-up    HISTORY/CURRENT STATUS: HPI For routine med check.  Under a lot of stress. MIL broke up with her BF and that has caused more stress in the family.  Her mother-in-law started dating this guy only 5 weeks or so after her father-in-law died.  Traci [name] is sad because of how this affects her husband.  She has been more emotional, gets angry easily.  We increased the Klonopin frequency a few months ago which has helped keep her more "even."  She does not like to take medicine anyway but knows she has to do right now.  Because of the stress in the family she has not been doing much of anything for fun.  Work is going okay although that stressful too.  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 He retired over the summer.  She sleeps okay.  Energy and motivation are good.  ADLs and personal hygiene are normal.  Appetite is normal and weight is stable.  Not isolating.  No suicidal or homicidal thoughts.  Patient denies increased energy with decreased need for sleep, increased talkativeness, racing thoughts, impulsivity or risky behaviors, increased spending, increased libido, grandiosity, increased irritability or anger, paranoia, or hallucinations.  Denies dizziness, syncope, seizures, numbness, tingling, tremor, tics, unsteady gait, slurred speech, confusion. Denies muscle or joint pain, stiffness, or dystonia.   Individual Medical History/ Review of Systems: Changes? :No      Past medications for mental health diagnoses include: Zoloft, Xanax, Wellbutrin, Lexapro, Effexor, Luvox, Cymbalta caused more anxiety then depression  Allergies: Wellbutrin [bupropion] and Adhesive [tape]  Current Medications:  Current Outpatient Medications:    acetaminophen  (TYLENOL) 500 MG tablet, Take 1,000 mg by mouth 2 (two) times daily as needed. Rapid release, Disp: , Rfl:    azaTHIOprine (IMURAN) 50 MG tablet, Take 125 mg by mouth daily., Disp: , Rfl:    Cholecalciferol 25 MCG (1000 UT) tablet, Take by mouth., Disp: , Rfl:    clonazePAM (KLONOPIN) 0.5 MG tablet, TAKE 1/2- 1 TABLET BY MOUTH three times DAILY AS NEEDED, Disp: 270 tablet, Rfl: 1   cyanocobalamin (,VITAMIN B-12,) 1000 MCG/ML injection, Inject 1,000 mcg into the muscle every 30 (thirty) days., Disp: , Rfl:    ferrous sulfate 325 (65 FE) MG tablet, Take by mouth., Disp: , Rfl:    fluticasone (FLONASE) 50 MCG/ACT nasal spray, Place into the nose., Disp: , Rfl:    folic acid (FOLVITE) 1 MG tablet, Take 1 mg by mouth daily., Disp: , Rfl:    ibuprofen (ADVIL) 600 MG tablet, Take 600 mg by mouth every 6 (six) hours as needed., Disp: , Rfl:    inFLIXimab (REMICADE IV), Inject into the vein., Disp: , Rfl:    lamoTRIgine (LAMICTAL) 200 MG tablet, TAKE 1 TABLET(200 MG) BY MOUTH TWICE DAILY, Disp: 180 tablet, Rfl: 0   loratadine (CLARITIN) 10 MG tablet, Take 10 mg by mouth daily., Disp: , Rfl:    Melatonin 10 MG TABS, Take by mouth., Disp: , Rfl:    montelukast (SINGULAIR) 10 MG tablet, TAKE 1 TABLET(10 MG) BY MOUTH DAILY, Disp: , Rfl:    norethindrone (AYGESTIN) 5 MG tablet, Take 5 mg by mouth daily., Disp: , Rfl:    norethindrone (AYGESTIN) 5 MG tablet, Take 2 tablets by  mouth daily., Disp: , Rfl:    omeprazole (PRILOSEC) 40 MG capsule, Take 40 mg by mouth daily., Disp: , Rfl:    rOPINIRole (REQUIP) 4 MG tablet, TAKE 2 TABLETS(8 MG) BY MOUTH AT BEDTIME (Patient taking differently: 1 mg.), Disp: 180 tablet, Rfl: 0   tiZANidine (ZANAFLEX) 4 MG tablet, TAKE 1 TABLET(4 MG) BY MOUTH AT BEDTIME, Disp: 30 tablet, Rfl: 0   sertraline (ZOLOFT) 100 MG tablet, Take 2.5 tablets (250 mg total) by mouth daily., Disp: 225 tablet, Rfl: 1 Medication Side Effects: none  Family Medical/ Social History: Changes? See  hpi  MENTAL HEALTH EXAM:  There were no vitals taken for this visit.There is no height or weight on file to calculate BMI.  General Appearance: Casual, Well Groomed and Obese  Eye Contact:  Good  Speech:  Clear and Coherent and Normal Rate  Volume:  Normal  Mood:   sad  Affect:  Congruent  Thought Process:  Goal Directed and Descriptions of Associations: Circumstantial  Orientation:  Full (Time, Place, and Person)  Thought Content: Logical   Suicidal Thoughts:  No  Homicidal Thoughts:  No  Memory:  WNL  Judgement:  Good  Insight:  Good  Psychomotor Activity:  Normal  Concentration:  Concentration: Good and Attention Span: Good  Recall:  Good  Fund of Knowledge: Good  Language: Good  Assets:  Desire for Improvement Financial Resources/Insurance Housing Transportation Vocational/Educational  ADL's:  Intact  Cognition: WNL  Prognosis:  Good   DIAGNOSES:    ICD-10-CM   1. Situational mixed anxiety and depressive disorder  F43.23     2. Restless leg syndrome  G25.81     3. Grief  F43.21       Receiving Psychotherapy: No   RECOMMENDATIONS:  PDMP reviewed.  No results available I provided 20 minutes of face to face time during this encounter, including time spent before and after the visit in records review, medical decision making, counseling pertinent to today's visit, and charting.   Recommend increasing Zoloft.  She has been on a higher dose in the past but not when also on Lamictal.  Hopefully that will help with the anxiety and depression that is creeping back in although situational.  Continue Klonopin 0.5 mg, 1/2-1 tid prn. Continue Lamictal 200 mg, 1 p.o. twice daily. Continue Ropinerole 1 mg p.o. nightly.   Increase Zoloft 100 mg to 2.5 pills daily. Continue MVI, Vit D, B Complex. Recommend therapy. Return in 6 wk.  [name], PA-C

## 2022-02-28 NOTE — Patient Instructions (Addendum)
Good to see you! I'm excited for [facility] for you

## 2022-02-28 NOTE — Assessment & Plan Note (Signed)
Continued difficulty with this.  Seems to be more on the left than the right at the moment.  Patient has been doing more repetitive activity.  We discussed the Zanaflex 4 mg to take at night.  Discussed which activities to do and which ones to avoid.  Increase activity slowly over the course the next several weeks.  Discussed icing regimen.  Responds well to osteopathic manipulation today.  Follow-up again in 8 weeks for further evaluation of patient's chronic problem.

## 2022-04-04 ENCOUNTER — Other Ambulatory Visit: Payer: Self-pay | Admitting: Physician Assistant

## 2022-04-11 ENCOUNTER — Ambulatory Visit: Payer: BC Managed Care – PPO | Admitting: Physician Assistant

## 2022-04-17 NOTE — Progress Notes (Deleted)
  [place] Phone: [phone] Subjective:    I'm seeing this patient by the request  of:  London Pepper, MD  CC:   [id number]  Traci [name] is a 41 y.o. female coming in with complaint of back and neck pain. OMT 02/28/2022. Patient states   Medications patient has been prescribed: None  Taking:         Reviewed prior external information including notes and imaging from previsou exam, outside providers and external EMR if available.   As well as notes that were available from care everywhere and other healthcare systems.  Past medical history, social, surgical and family history all reviewed in electronic medical record.  No pertanent information unless stated regarding to the chief complaint.   Past Medical History:  Diagnosis Date   Anemia    Anxiety    Blood clot in vein    Right lower leg after surgery   Crohn's disease (HCC)    Depression    Dyspnea    Elevated BP without diagnosis of hypertension    Enlarged liver    GERD (gastroesophageal reflux disease)    Headache    IBS (irritable bowel syndrome)    Laceration of left little finger 02/28/2018   Lower extremity edema    Restless legs    Seasonal allergies    Uterine fibroid    Vitamin B 12 deficiency    Vitamin D deficiency     Allergies  Allergen Reactions   Wellbutrin [Bupropion] Anxiety   Adhesive [Tape]      Review of Systems:  No headache, visual changes, nausea, vomiting, diarrhea, constipation, dizziness, abdominal pain, skin rash, fevers, chills, night sweats, weight loss, swollen lymph nodes, body aches, joint swelling, chest pain, shortness of breath, mood changes. POSITIVE muscle aches  Objective  There were no vitals taken for this visit.   General: No apparent distress alert and oriented x3 mood and affect normal, [name], extraocular movements intact  Respiratory:  Patient's speak in full sentences and does not appear short of breath  Cardiovascular: No lower extremity edema, non tender, no erythema  Gait MSK:  Back   Osteopathic findings  C2 flexed rotated and side bent right C6 flexed rotated and side bent left T3 extended rotated and side bent right inhaled rib T9 extended rotated and side bent left L2 flexed rotated and side bent right Sacrum right on right       Assessment and Plan:  No problem-specific Assessment & Plan notes found for this encounter.    Nonallopathic problems  Decision today to treat with OMT was based on Physical Exam  After verbal consent patient was treated with HVLA, ME, [name], rib, thoracic, lumbar, [name], stretches and lifestyle modifications  See medications in patient instructions if given  Patient will follow up in 4-8 weeks             Note: This dictation was prepared with Dragon dictation along with smaller phrase technology. Any transcriptional errors that result from this process are unintentional.

## 2022-04-18 ENCOUNTER — Ambulatory Visit: Payer: BC Managed Care – PPO | Admitting: Physician Assistant

## 2022-04-18 ENCOUNTER — Encounter: Payer: Self-pay | Admitting: Physician Assistant

## 2022-04-18 DIAGNOSIS — F4323 Adjustment disorder with mixed anxiety and depressed mood: Secondary | ICD-10-CM

## 2022-04-18 DIAGNOSIS — F4321 Adjustment disorder with depressed mood: Secondary | ICD-10-CM

## 2022-04-18 DIAGNOSIS — F411 Generalized anxiety disorder: Secondary | ICD-10-CM

## 2022-04-18 DIAGNOSIS — G2581 Restless legs syndrome: Secondary | ICD-10-CM

## 2022-04-18 NOTE — Progress Notes (Signed)
[facility]  Patient ID: Roslyn Else,  MRN: [id number]  PCP: London Pepper, MD  Date of Evaluation: 04/18/2022 time spent:20 minutes  Chief Complaint:  Chief Complaint   Anxiety; Depression; Follow-up    HISTORY/CURRENT STATUS: HPI For routine med check. Accompanied by husband, [name].  We increased the Zoloft at [place]. Not sure if's helped or not. Very stressed with fm, MIL. See previous notes. Klonopin helps the anxiety, she doesn't want to need it, but she does.  Gets overwhelmed with everything, sometimes panicky too.   Patient is able to enjoy things.  Energy and motivation are good.  Work is going well.   No extreme sadness, tearfulness, or feelings of hopelessness.  Sleeps well.  ADLs and personal hygiene are normal.   Denies any changes in concentration, making decisions, or remembering things.  Appetite has not changed.  Weight is stable.   Denies suicidal or homicidal thoughts.  Patient denies increased energy with decreased need for sleep, increased talkativeness, racing thoughts, impulsivity or risky behaviors, increased spending, increased libido, grandiosity, increased irritability or anger, paranoia, or hallucinations.  Denies dizziness, syncope, seizures, numbness, tingling, tremor, tics, unsteady gait, slurred speech, confusion. Denies muscle or joint pain, stiffness, or dystonia.   Individual Medical History/ Review of Systems: Changes? :No      Past medications for mental health diagnoses include: Zoloft, Xanax, Wellbutrin, Lexapro, Effexor, Luvox, Cymbalta caused more anxiety then depression  Allergies: Wellbutrin [bupropion] and Adhesive [tape]  Current Medications:  Current Outpatient Medications:    acetaminophen (TYLENOL) 500 MG tablet, Take 1,000 mg by mouth 2 (two) times daily as needed. Rapid release, Disp: , Rfl:    azaTHIOprine (IMURAN) 50 MG tablet, Take 125 mg by mouth daily., Disp: , Rfl:    Cholecalciferol 25 MCG (1000 UT) tablet,  Take by mouth., Disp: , Rfl:    clonazePAM (KLONOPIN) 0.5 MG tablet, TAKE 1/2- 1 TABLET BY MOUTH three times DAILY AS NEEDED, Disp: 270 tablet, Rfl: 1   cyanocobalamin (,VITAMIN B-12,) 1000 MCG/ML injection, Inject 1,000 mcg into the muscle every 30 (thirty) days., Disp: , Rfl:    ferrous sulfate 325 (65 FE) MG tablet, Take by mouth., Disp: , Rfl:    fluticasone (FLONASE) 50 MCG/ACT nasal spray, Place into the nose., Disp: , Rfl:    folic acid (FOLVITE) 1 MG tablet, Take 1 mg by mouth daily., Disp: , Rfl:    ibuprofen (ADVIL) 600 MG tablet, Take 600 mg by mouth every 6 (six) hours as needed., Disp: , Rfl:    inFLIXimab (REMICADE IV), Inject into the vein., Disp: , Rfl:    lamoTRIgine (LAMICTAL) 200 MG tablet, TAKE 1 TABLET(200 MG) BY MOUTH TWICE DAILY, Disp: 180 tablet, Rfl: 0   loratadine (CLARITIN) 10 MG tablet, Take 10 mg by mouth daily., Disp: , Rfl:    Melatonin 10 MG TABS, Take by mouth., Disp: , Rfl:    montelukast (SINGULAIR) 10 MG tablet, TAKE 1 TABLET(10 MG) BY MOUTH DAILY, Disp: , Rfl:    norethindrone (AYGESTIN) 5 MG tablet, Take 5 mg by mouth daily., Disp: , Rfl:    norethindrone (AYGESTIN) 5 MG tablet, Take 2 tablets by mouth daily., Disp: , Rfl:    omeprazole (PRILOSEC) 40 MG capsule, Take 40 mg by mouth daily., Disp: , Rfl:    rOPINIRole (REQUIP) 4 MG tablet, TAKE 2 TABLETS(8 MG) BY MOUTH AT BEDTIME (Patient taking differently: Take 2 mg by mouth at bedtime.), Disp: 180 tablet, Rfl: 0   sertraline (ZOLOFT) 100  MG tablet, Take 2.5 tablets (250 mg total) by mouth daily., Disp: 225 tablet, Rfl: 1   tiZANidine (ZANAFLEX) 4 MG tablet, TAKE 1 TABLET(4 MG) BY MOUTH AT BEDTIME, Disp: 30 tablet, Rfl: 0 Medication Side Effects: none  Family Medical/ Social History: Changes? None  MENTAL HEALTH EXAM:  There were no vitals taken for this visit.There is no height or weight on file to calculate BMI.  General Appearance: Casual, Well Groomed and Obese  Eye Contact:  Good  Speech:  Clear  and Coherent and Normal Rate  Volume:  Normal  Mood:  Anxious and sad  Affect:  Congruent  Thought Process:  Goal Directed and Descriptions of Associations: Circumstantial  Orientation:  Full (Time, Place, and Person)  Thought Content: Logical   Suicidal Thoughts:  No  Homicidal Thoughts:  No  Memory:  WNL  Judgement:  Good  Insight:  Good  Psychomotor Activity:  Normal  Concentration:  Concentration: Good and Attention Span: Good  Recall:  Good  Fund of Knowledge: Good  Language: Good  Assets:  Desire for Improvement Financial Resources/Insurance Housing Transportation Vocational/Educational  ADL's:  Intact  Cognition: WNL  Prognosis:  Good   DIAGNOSES:    ICD-10-CM   1. Situational mixed anxiety and depressive disorder  F43.23     2. Restless leg syndrome  G25.81     3. Generalized anxiety disorder  F41.1     4. Grief  F43.21       Receiving Psychotherapy: No   RECOMMENDATIONS:  PDMP reviewed.  No results available I provided 20 minutes of face to face time during this encounter, including time spent before and after the visit in records review, medical decision making, counseling pertinent to today's visit, and charting.   Discussed diff options. Due to situational aspect of anx/dep, we decide to make no med changes yet. Will start sex therapy soon, also recommend individual therapy to help w/ coping skills.   Continue Klonopin 0.5 mg, 1/2-1 tid prn. Continue Lamictal 200 mg, 1 p.o. twice daily. Continue Ropinerole 2 mg p.o. nightly.   Continue  Zoloft 100 mg to 2.5 pills daily. Continue MVI, Vit D, B Complex. Start therapy soon. Return in 4 wk.  [name], PA-C

## 2022-04-20 ENCOUNTER — Ambulatory Visit: Payer: BC Managed Care – PPO | Admitting: Family Medicine

## 2022-05-18 ENCOUNTER — Ambulatory Visit: Payer: BC Managed Care – PPO | Admitting: Physician Assistant

## 2022-05-18 ENCOUNTER — Encounter: Payer: Self-pay | Admitting: Physician Assistant

## 2022-05-18 DIAGNOSIS — E559 Vitamin D deficiency, unspecified: Secondary | ICD-10-CM | POA: Diagnosis not present

## 2022-05-18 DIAGNOSIS — F4321 Adjustment disorder with depressed mood: Secondary | ICD-10-CM

## 2022-05-18 DIAGNOSIS — F4323 Adjustment disorder with mixed anxiety and depressed mood: Secondary | ICD-10-CM | POA: Diagnosis not present

## 2022-05-18 DIAGNOSIS — G2581 Restless legs syndrome: Secondary | ICD-10-CM

## 2022-05-18 NOTE — Progress Notes (Unsigned)
[facility]  Patient ID: Traci [name],  MRN: [id number]  PCP: London Pepper, MD  Date of Evaluation: 05/18/2022 time spent:20 minutes  Chief Complaint:  Chief Complaint   Depression; Anxiety; Follow-up    HISTORY/CURRENT STATUS: HPI For routine med check. Accompanied by husband, [name].  Christmas with her husbands family went better than expected. Her MIL is still causing a lot of stress, wanting to date again after the previous guy broke up with her and married someone else in 2 months. She was able to have a heart to heart talk with her, got some things off her chest which was helpful.  She and husband are still grieving the loss of his dad, the first anniversary of his death is later this month. They went on a cruise before Christmas, that was relaxing.  Patient is able to enjoy things.  Energy and motivation are fair to good. Work is going well.   No extreme sadness, tearfulness, or feelings of hopelessness.  Sleeps well most of the time. RLS is controlled.  ADLs and personal hygiene are normal.   Denies any changes in concentration, making decisions, or remembering things.  Appetite has not changed.  Weight is stable.   Anxiety is well controlled with Klonopin. Doesn't usually have PA, just gets overwhelmed.  Denies suicidal or homicidal thoughts.  Patient denies increased energy with decreased need for sleep, increased talkativeness, racing thoughts, impulsivity or risky behaviors, increased spending, increased libido, grandiosity, increased irritability or anger, paranoia, or hallucinations.  Denies dizziness, syncope, seizures, numbness, tingling, tremor, tics, unsteady gait, slurred speech, confusion. Denies muscle or joint pain, stiffness, or dystonia.   Individual Medical History/ Review of Systems: Changes? :No      Past medications for mental health diagnoses include: Zoloft, Xanax, Wellbutrin, Lexapro, Effexor, Luvox, Cymbalta caused more anxiety then  depression  Allergies: Wellbutrin [bupropion] and Adhesive [tape]  Current Medications:  Current Outpatient Medications:    acetaminophen (TYLENOL) 500 MG tablet, Take 1,000 mg by mouth 2 (two) times daily as needed. Rapid release, Disp: , Rfl:    azaTHIOprine (IMURAN) 50 MG tablet, Take 125 mg by mouth daily., Disp: , Rfl:    Cholecalciferol 25 MCG (1000 UT) tablet, Take by mouth., Disp: , Rfl:    clonazePAM (KLONOPIN) 0.5 MG tablet, TAKE 1/2- 1 TABLET BY MOUTH three times DAILY AS NEEDED, Disp: 270 tablet, Rfl: 1   cyanocobalamin (,VITAMIN B-12,) 1000 MCG/ML injection, Inject 1,000 mcg into the muscle every 30 (thirty) days., Disp: , Rfl:    ferrous sulfate 325 (65 FE) MG tablet, Take by mouth., Disp: , Rfl:    fluticasone (FLONASE) 50 MCG/ACT nasal spray, Place into the nose., Disp: , Rfl:    folic acid (FOLVITE) 1 MG tablet, Take 1 mg by mouth daily., Disp: , Rfl:    ibuprofen (ADVIL) 600 MG tablet, Take 600 mg by mouth every 6 (six) hours as needed., Disp: , Rfl:    inFLIXimab (REMICADE IV), Inject into the vein., Disp: , Rfl:    lamoTRIgine (LAMICTAL) 200 MG tablet, TAKE 1 TABLET(200 MG) BY MOUTH TWICE DAILY, Disp: 180 tablet, Rfl: 0   loratadine (CLARITIN) 10 MG tablet, Take 10 mg by mouth daily., Disp: , Rfl:    Melatonin 10 MG TABS, Take by mouth., Disp: , Rfl:    montelukast (SINGULAIR) 10 MG tablet, TAKE 1 TABLET(10 MG) BY MOUTH DAILY, Disp: , Rfl:    norethindrone (AYGESTIN) 5 MG tablet, Take 5 mg by mouth daily., Disp: , Rfl:  norethindrone (AYGESTIN) 5 MG tablet, Take 2 tablets by mouth daily., Disp: , Rfl:    omeprazole (PRILOSEC) 40 MG capsule, Take 40 mg by mouth daily., Disp: , Rfl:    rOPINIRole (REQUIP) 4 MG tablet, TAKE 2 TABLETS(8 MG) BY MOUTH AT BEDTIME (Patient taking differently: Take 2 mg by mouth at bedtime.), Disp: 180 tablet, Rfl: 0   sertraline (ZOLOFT) 100 MG tablet, Take 2.5 tablets (250 mg total) by mouth daily., Disp: 225 tablet, Rfl: 1   tiZANidine  (ZANAFLEX) 4 MG tablet, TAKE 1 TABLET(4 MG) BY MOUTH AT BEDTIME, Disp: 30 tablet, Rfl: 0 Medication Side Effects: none  Family Medical/ Social History: Changes? None  MENTAL HEALTH EXAM:  There were no vitals taken for this visit.There is no height or weight on file to calculate BMI.  General Appearance: Casual, Well Groomed and Obese  Eye Contact:  Good  Speech:  Clear and Coherent and Normal Rate  Volume:  Normal  Mood:  Euthymic  Affect:  Congruent  Thought Process:  Goal Directed and Descriptions of Associations: Circumstantial  Orientation:  Full (Time, Place, and Person)  Thought Content: Logical   Suicidal Thoughts:  No  Homicidal Thoughts:  No  Memory:  WNL  Judgement:  Good  Insight:  Good  Psychomotor Activity:  Normal  Concentration:  Concentration: Good and Attention Span: Good  Recall:  Good  Fund of Knowledge: Good  Language: Good  Assets:  Desire for Improvement Financial Resources/Insurance Housing Transportation Vocational/Educational  ADL's:  Intact  Cognition: WNL  Prognosis:  Good   DIAGNOSES:    ICD-10-CM   1. Situational mixed anxiety and depressive disorder  F43.23     2. Restless leg syndrome  G25.81     3. Grief  F43.21     4. Vitamin D deficiency  E55.9       Receiving Psychotherapy: No   RECOMMENDATIONS:  PDMP reviewed.  No controlled substances listed. I provided 20 minutes of face to face time during this encounter, including time spent before and after the visit in records review, medical decision making, counseling pertinent to today's visit, and charting.   She's doing better so no change in tx needed.   Continue Klonopin 0.5 mg, 1/2-1 tid prn. Continue Lamictal 200 mg, 1 p.o. twice daily. Continue Ropinerole 2 mg p.o. nightly.   Continue  Zoloft 100 mg to 2.5 pills daily. Continue MVI, Vit D, B Complex. Start therapy soon. Return in 7 weeks.   [name], PA-C

## 2022-06-01 ENCOUNTER — Other Ambulatory Visit: Payer: Self-pay | Admitting: Physician Assistant

## 2022-06-01 NOTE — Telephone Encounter (Signed)
Due 1/29

## 2022-06-14 ENCOUNTER — Encounter: Payer: Self-pay | Admitting: Family Medicine

## 2022-06-14 ENCOUNTER — Ambulatory Visit (INDEPENDENT_AMBULATORY_CARE_PROVIDER_SITE_OTHER): Payer: BC Managed Care – PPO | Admitting: Family Medicine

## 2022-06-14 VITALS — BP 110/84 | HR 84 | Ht 67.0 in | Wt 285.0 lb

## 2022-06-14 DIAGNOSIS — M9902 Segmental and somatic dysfunction of thoracic region: Secondary | ICD-10-CM | POA: Diagnosis not present

## 2022-06-14 DIAGNOSIS — M9903 Segmental and somatic dysfunction of lumbar region: Secondary | ICD-10-CM

## 2022-06-14 DIAGNOSIS — M25512 Pain in left shoulder: Secondary | ICD-10-CM | POA: Diagnosis not present

## 2022-06-14 DIAGNOSIS — M9904 Segmental and somatic dysfunction of sacral region: Secondary | ICD-10-CM

## 2022-06-14 DIAGNOSIS — M9901 Segmental and somatic dysfunction of cervical region: Secondary | ICD-10-CM

## 2022-06-14 DIAGNOSIS — G2589 Other specified extrapyramidal and movement disorders: Secondary | ICD-10-CM | POA: Diagnosis not present

## 2022-06-14 DIAGNOSIS — M9908 Segmental and somatic dysfunction of rib cage: Secondary | ICD-10-CM | POA: Diagnosis not present

## 2022-06-14 NOTE — Assessment & Plan Note (Signed)
New problem.  Multiple trigger points noted in the trapezius, rhomboid and latissimus dorsi.  Total of 3 cc of 0.5% Marcaine and 1 cc of Kenalog 40 mg/mL.  Discussed with patient about home exercises and icing regimen.  Discussed the muscle relaxer at night.  Follow-up again in 6 to 8 weeks

## 2022-06-14 NOTE — Progress Notes (Signed)
Traci [name] Phone: [phone] Subjective:    I'm seeing this patient by the request  of:  London Pepper, MD  CC: Back and neck pain follow-up  [id number]  Traci [name] is a 42 y.o. female coming in with complaint of back and neck pain. OMT 02/28/2022. Patient states that she started having pain last week in L side of neck that radiates into the L shoulder. Notes that the trap muscle has been tight compared to her right side. Pain radiates into her forearm. Denies any numbness. Notes decrease in hand strength.   Medications patient has been prescribed: Zanaflex  Taking:         Reviewed prior external information including notes and imaging from previsou exam, outside providers and external EMR if available.   As well as notes that were available from care everywhere and other healthcare systems.  Past medical history, social, surgical and family history all reviewed in electronic medical record.  No pertanent information unless stated regarding to the chief complaint.   Past Medical History:  Diagnosis Date   Anemia    Anxiety    Blood clot in vein    Right lower leg after surgery   Crohn's disease (HCC)    Depression    Dyspnea    Elevated BP without diagnosis of hypertension    Enlarged liver    GERD (gastroesophageal reflux disease)    Headache    IBS (irritable bowel syndrome)    Laceration of left little finger 02/28/2018   Lower extremity edema    Restless legs    Seasonal allergies    Uterine fibroid    Vitamin B 12 deficiency    Vitamin D deficiency     Allergies  Allergen Reactions   Wellbutrin [Bupropion] Anxiety   Adhesive [Tape]      Review of Systems:  No headache, visual changes, nausea, vomiting, diarrhea, constipation, dizziness, abdominal pain, skin rash, fevers, chills, night sweats, weight loss, swollen lymph nodes, body aches, joint swelling, chest pain,  shortness of breath, mood changes. POSITIVE muscle aches  Objective  Blood pressure 110/84, pulse 84, height '5\' 7"'$  (1.702 m), weight 285 lb (129.3 kg), SpO2 99 %.   General: No apparent distress alert and oriented x3 mood and affect normal, [name], [name], non tender, no erythema  Patient has significant spasms noted of the left trapezius, rhomboid and latissimus dorsi.  Osteopathic findings  C5 flexed rotated and side bent left T3 extended rotated and side bent left  inhaled rib T9 extended rotated and side bent left L2 flexed rotated and side bent right Sacrum right on right   After verbal consent patient was prepped with alcohol swab and with a 25-gauge half inch needle injected into 3 distinct trigger points in the trapezius, rhomboid, and latissimus dorsi.  Total of 3 cc of 0.5% Marcaine and 1 cc of Kenalog 40 mg/mL used.  Minimal blood loss.  Band-Aid placed.  Postinjection instructions given    Assessment and Plan:  Scapular dyskinesis Scapular dyskinesis with significant trigger points noted in the left trapezius area today.  Some of it could be some of the mild arthritic changes of the neck that has been previously seen on x-rays.  Discussed icing regimen and home exercises, which activities to do and which  ones to avoid.  We discussed posture and ergonomics.  Discussed Zanaflex 4 mg at night that I think will be beneficial.  Follow-up again in 6 to 8 weeks    Nonallopathic problems  Decision today to treat with OMT was based on Physical Exam  After verbal consent patient was treated with HVLA, ME, [name], rib, thoracic, lumbar, [name], stretches and lifestyle  modifications  See medications in patient instructions if given  Patient will follow up in 4-8 weeks    The above documentation has been reviewed and is accurate and complete Lyndal Pulley, DO          Note: This dictation was prepared with Dragon dictation along with smaller phrase technology. Any transcriptional errors that result from this process are unintentional.

## 2022-06-14 NOTE — Assessment & Plan Note (Signed)
Scapular dyskinesis with significant trigger points noted in the left trapezius area today.  Some of it could be some of the mild arthritic changes of the neck that has been previously seen on x-rays.  Discussed icing regimen and home exercises, which activities to do and which ones to avoid.  We discussed posture and ergonomics.  Discussed Zanaflex 4 mg at night that I think will be beneficial.  Follow-up again in 6 to 8 weeks

## 2022-06-14 NOTE — Patient Instructions (Signed)
Injected trigger points today Use mm relaxer for next 3 nights then as needed See me again in 6 weeks

## 2022-06-16 ENCOUNTER — Other Ambulatory Visit: Payer: Self-pay | Admitting: Family Medicine

## 2022-07-03 ENCOUNTER — Ambulatory Visit: Payer: BC Managed Care – PPO | Admitting: Family Medicine

## 2022-07-06 ENCOUNTER — Encounter: Payer: Self-pay | Admitting: Physician Assistant

## 2022-07-06 ENCOUNTER — Ambulatory Visit: Payer: BC Managed Care – PPO | Admitting: Physician Assistant

## 2022-07-06 DIAGNOSIS — F411 Generalized anxiety disorder: Secondary | ICD-10-CM

## 2022-07-06 DIAGNOSIS — K50919 Crohn's disease, unspecified, with unspecified complications: Secondary | ICD-10-CM | POA: Diagnosis not present

## 2022-07-06 DIAGNOSIS — F4323 Adjustment disorder with mixed anxiety and depressed mood: Secondary | ICD-10-CM | POA: Diagnosis not present

## 2022-07-06 DIAGNOSIS — G2581 Restless legs syndrome: Secondary | ICD-10-CM | POA: Diagnosis not present

## 2022-07-06 MED ORDER — LAMOTRIGINE 200 MG PO TABS: ORAL_TABLET | ORAL | 3 refills | Status: DC

## 2022-07-06 MED ORDER — ROPINIROLE HCL 4 MG PO TABS: ORAL_TABLET | ORAL | 3 refills | Status: DC

## 2022-07-06 NOTE — Progress Notes (Signed)
[facility]  Patient ID: Traci [name],  MRN: [id number]  PCP: London Pepper, MD  Date of Evaluation: 07/06/2022 time spent:25 minutes  Chief Complaint:  Chief Complaint   Anxiety; Depression   Virtual Visit via Telehealth  I connected with patient by telephone, with their informed consent, and verified patient privacy and that I am speaking with the correct person using two identifiers.  I am private, in my office and the patient is at home.  I discussed the limitations, risks, security and privacy concerns of performing an evaluation and management service by telephone and the availability of in person appointments. I also discussed with the patient that there may be a patient responsible charge related to this service. The patient expressed understanding and agreed to proceed.   I discussed the assessment and treatment plan with the patient. The patient was provided an opportunity to ask questions and all were answered. The patient agreed with the plan and demonstrated an understanding of the instructions.   The patient was advised to call back or seek an in-person evaluation if the symptoms worsen or if the condition fails to improve as anticipated.  I provided 25 minutes of non-face-to-face time during this encounter.  HISTORY/CURRENT STATUS: HPI For routine med check.   At [place] no changes were made. Still has usual stressors but feels like current meds are working well and nothing needs to be changed.  Patient is able to enjoy things.  Energy and motivation are good for the most part. Depends on the timing of her last Remicaid infusion.  Work is ok. One of her coworkers is leaving soon so that's sad.   No feelings of hopelessness.  Sleeps well most of the time. ADLs and personal hygiene are normal.   Denies any changes in concentration, making decisions, or remembering things.  Appetite has not changed.  Weight is stable.   Denies suicidal or homicidal  thoughts.  Anxiety is well-controlled with current treatment. She takes Klonopin tid, or she has racing thoughts, ruminates, obsesses about things.   Patient denies increased energy with decreased need for sleep, increased talkativeness, racing thoughts, impulsivity or risky behaviors, increased spending, increased libido, grandiosity, increased irritability or anger, paranoia, or hallucinations.  Denies dizziness, syncope, seizures, numbness, tingling, tremor, tics, unsteady gait, slurred speech, confusion. Denies muscle or joint pain, stiffness, or dystonia.   Individual Medical History/ Review of Systems: Changes? :Yes    had covid 2 weeks ago, and Remicaid infusion yesterday  Past medications for mental health diagnoses include: Zoloft, Xanax, Wellbutrin, Lexapro, Effexor, Luvox, Cymbalta caused more anxiety then depression  Allergies: Wellbutrin [bupropion] and Adhesive [tape]  Current Medications:  Current Outpatient Medications:    acetaminophen (TYLENOL) 500 MG tablet, Take 1,000 mg by mouth 2 (two) times daily as needed. Rapid release, Disp: , Rfl:    azaTHIOprine (IMURAN) 50 MG tablet, Take 125 mg by mouth daily., Disp: , Rfl:    Cholecalciferol 25 MCG (1000 UT) tablet, Take by mouth., Disp: , Rfl:    clonazePAM (KLONOPIN) 0.5 MG tablet, TAKE 1/2 TO 1 TABLET BY MOUTH THREE TIMES DAILY AS NEEDED, Disp: 270 tablet, Rfl: 1   cyanocobalamin (,VITAMIN B-12,) 1000 MCG/ML injection, Inject 1,000 mcg into the muscle every 30 (thirty) days., Disp: , Rfl:    ferrous sulfate 325 (65 FE) MG tablet, Take by mouth., Disp: , Rfl:    fluticasone (FLONASE) 50 MCG/ACT nasal spray, Place into the nose., Disp: , Rfl:    folic acid (FOLVITE) 1  MG tablet, Take 1 mg by mouth daily., Disp: , Rfl:    ibuprofen (ADVIL) 600 MG tablet, Take 600 mg by mouth every 6 (six) hours as needed., Disp: , Rfl:    inFLIXimab (REMICADE IV), Inject into the vein., Disp: , Rfl:    loratadine (CLARITIN) 10 MG tablet, Take  10 mg by mouth daily., Disp: , Rfl:    Melatonin 10 MG TABS, Take by mouth., Disp: , Rfl:    montelukast (SINGULAIR) 10 MG tablet, TAKE 1 TABLET(10 MG) BY MOUTH DAILY, Disp: , Rfl:    norethindrone (AYGESTIN) 5 MG tablet, Take 2 tablets by mouth daily., Disp: , Rfl:    omeprazole (PRILOSEC) 40 MG capsule, Take 40 mg by mouth daily., Disp: , Rfl:    sertraline (ZOLOFT) 100 MG tablet, Take 2.5 tablets (250 mg total) by mouth daily., Disp: 225 tablet, Rfl: 1   tiZANidine (ZANAFLEX) 4 MG tablet, TAKE 1 TABLET(4 MG) BY MOUTH AT BEDTIME, Disp: 30 tablet, Rfl: 0   lamoTRIgine (LAMICTAL) 200 MG tablet, TAKE 1 TABLET(200 MG) BY MOUTH TWICE DAILY, Disp: 180 tablet, Rfl: 3   rOPINIRole (REQUIP) 4 MG tablet, 1-2 as directed, Disp: 180 tablet, Rfl: 3 Medication Side Effects: none  Family Medical/ Social History: Changes? None  MENTAL HEALTH EXAM:  There were no vitals taken for this visit.There is no height or weight on file to calculate BMI.  General Appearance:  unable to assess  Eye Contact:   unable to assess  Speech:  Clear and Coherent and Normal Rate  Volume:  Normal  Mood:  Euthymic  Affect:   unable to assess  Thought Process:  Goal Directed and Descriptions of Associations: Circumstantial  Orientation:  Full (Time, Place, and Person)  Thought Content: Logical   Suicidal Thoughts:  No  Homicidal Thoughts:  No  Memory:  WNL  Judgement:  Good  Insight:  Good  Psychomotor Activity:   unable to assess  Concentration:  Concentration: Good and Attention Span: Good  Recall:  Good  Fund of Knowledge: Good  Language: Good  Assets:  Desire for Improvement Financial Resources/Insurance Housing Transportation Vocational/Educational  ADL's:  Intact  Cognition: WNL  Prognosis:  Good   DIAGNOSES:    ICD-10-CM   1. Situational mixed anxiety and depressive disorder  F43.23     2. Restless leg syndrome  G25.81     3. Generalized anxiety disorder  F41.1     4. Crohn's disease with  complication, unspecified gastrointestinal tract location [facility])  K50.919       Receiving Psychotherapy: No   RECOMMENDATIONS:  PDMP reviewed.  No controlled substances listed. I provided 25 minutes of non-face-to-face time during this encounter, including time spent before and after the visit in records review, medical decision making, counseling pertinent to today's visit, and charting.   [name] is doing well with current meds so no changes will be made.   Continue Klonopin 0.5 mg, 1/2-1 tid prn. Continue Lamictal 200 mg, 1 p.o. twice daily. Continue Ropinerole 2 mg, 1-2 p.o. nightly.  (She titrates as need be) Continue  Zoloft 100 mg to 2.5 pills daily. Continue MVI, Vit D, B Complex. Return in 3 months.   [name], PA-C

## 2022-07-25 ENCOUNTER — Ambulatory Visit: Payer: BC Managed Care – PPO | Admitting: Family Medicine

## 2022-08-08 ENCOUNTER — Other Ambulatory Visit: Payer: Self-pay | Admitting: Physician Assistant

## 2022-08-20 IMAGING — DX DG ABDOMEN 1V
2 series · 2 of 2 positions shown · non-contrast
Comparison: December 13, 2020

CLINICAL DATA: Right renal calculus.

EXAM:
ABDOMEN - 1 VIEW

[abdomen kub (1 of 2)]
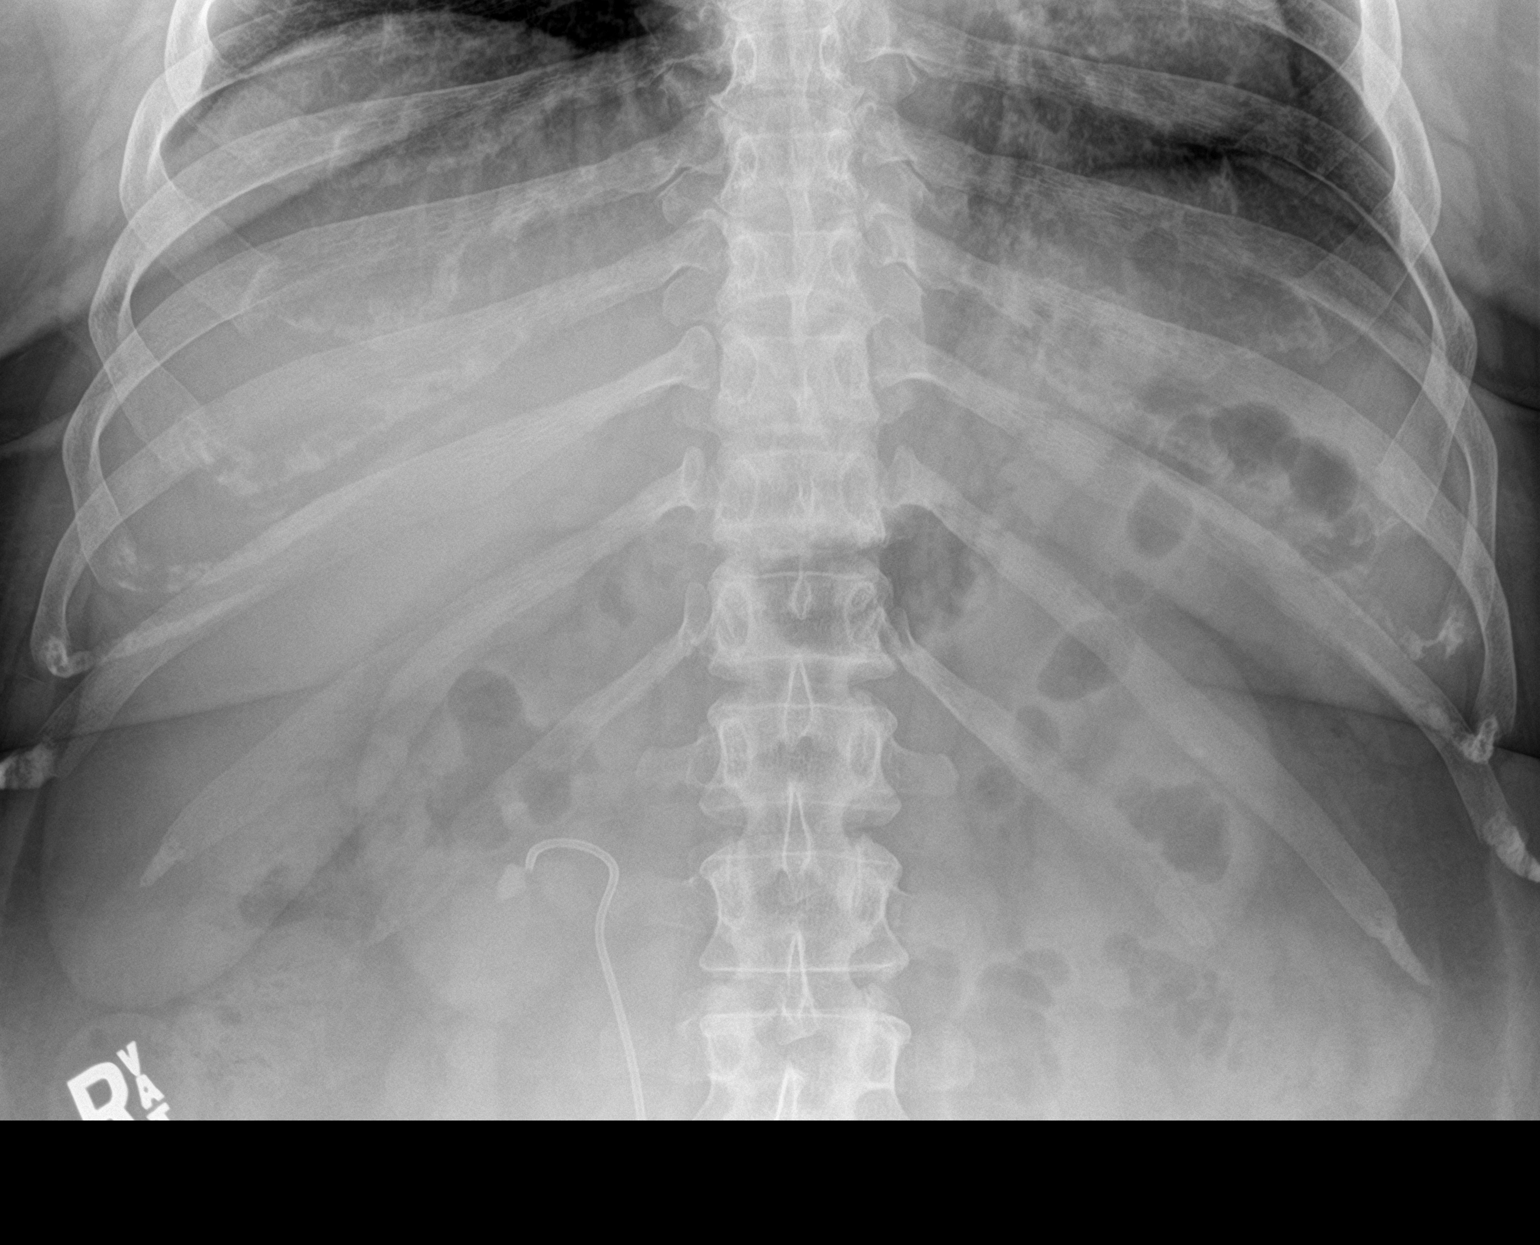

[abdomen kub (2 of 2)]
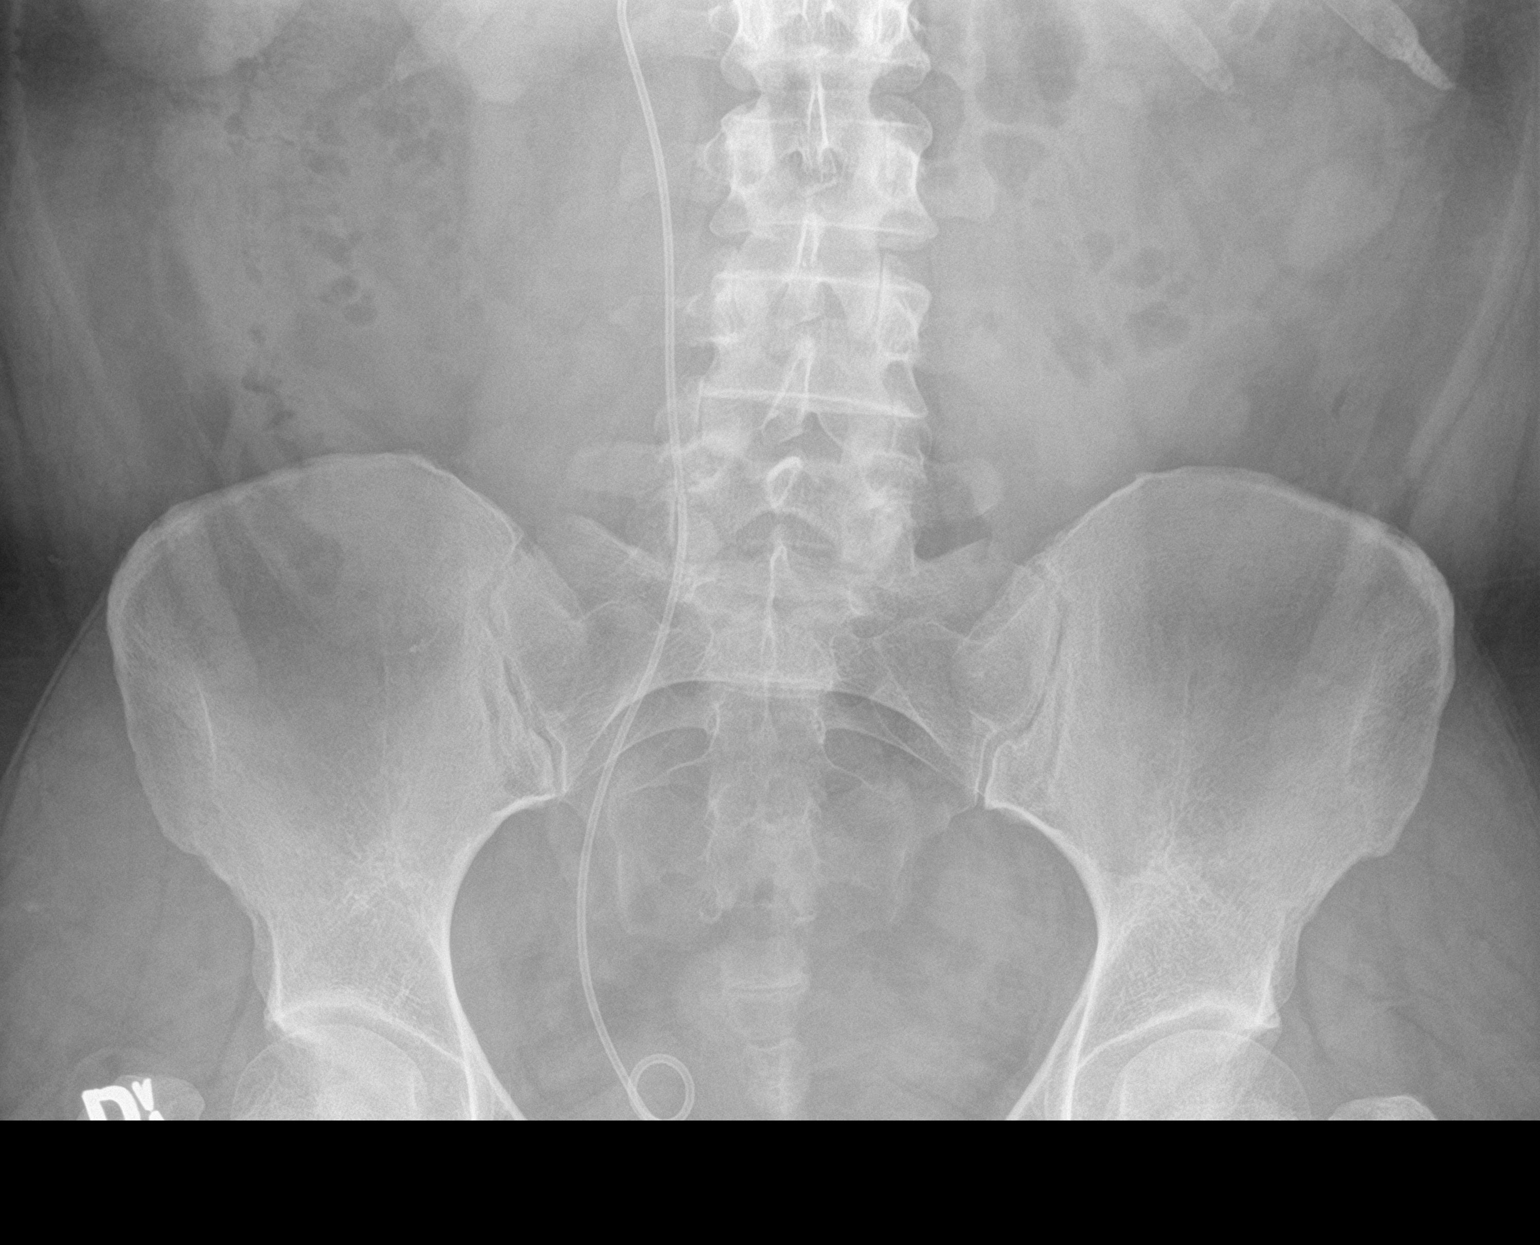

[2 of 2 positions shown; findings below may reference images not displayed]

FINDINGS: The bowel gas pattern is normal. There is stable right-sided endo
ureteral stent positioning. A stable 1.1 cm soft tissue
calcification is seen projecting over the lower pole of the right
kidney.
IMPRESSION: Stable right renal calculus and right-sided endo ureteral stent
positioning.

## 2022-08-20 NOTE — Progress Notes (Unsigned)
[name] [address] [place] [address] Phone: [phone] Subjective:   Traci [name], am serving as a scribe for Dr. Antoine Primas.  I'm seeing this patient by the request  of:  Farris Has, MD  CC: Neck pain follow-up with  [id number]  Traci [name] is a 42 y.o. female coming in with complaint of back and neck pain. OMT 06/14/2022. Patient states that she is tight as she had to reschedule her appointment. Increasing tightness in L arm, cervical spine.  Patient states that some of it seems to be more exacerbated with traveling recently.  Medications patient has been prescribed: Zanaflex  Taking:         Reviewed prior external information including notes and imaging from previsou exam, outside providers and external EMR if available.   As well as notes that were available from care everywhere and other healthcare systems.  Past medical history, social, surgical and family history all reviewed in electronic medical record.  No pertanent information unless stated regarding to the chief complaint.   Past Medical History:  Diagnosis Date   Anemia    Anxiety    Blood clot in vein    Right lower leg after surgery   Crohn's disease (HCC)    Depression    Dyspnea    Elevated BP without diagnosis of hypertension    Enlarged liver    GERD (gastroesophageal reflux disease)    Headache    IBS (irritable bowel syndrome)    Laceration of left little finger 02/28/2018   Lower extremity edema    Restless legs    Seasonal allergies    Uterine fibroid    Vitamin B 12 deficiency    Vitamin D deficiency     Allergies  Allergen Reactions   Wellbutrin [Bupropion] Anxiety   Adhesive [Tape]      Review of Systems:  No headache, visual changes, nausea, vomiting, diarrhea, constipation, dizziness, abdominal pain, skin rash, fevers, chills, night sweats, weight loss, swollen lymph nodes, body aches, joint swelling, chest pain,  shortness of breath, mood changes. POSITIVE muscle aches  Objective  Blood pressure 118/78, pulse (!) 101, height 5\' 7"  (1.702 m), weight 287 lb (130.2 kg), SpO2 96 %.   General: No apparent distress alert and oriented x3 mood and affect normal, [name], [name], non tender, no erythema  Neck exam does show some significant increase in tightness noted in the scapular area on the left side.  Patient also has tenderness to palpation on the left side of the neck as well.  Osteopathic findings C6 flexed rotated and side bent left T3 extended rotated and side bent left inhaled rib T8 extended rotated and side bent left L3 flexed rotated and side bent right Sacrum right on right       Assessment and Plan:  Trigger point of left shoulder region Left shoulder has more tightness again noted.  Discussed with patient that there could be potentially some cervical radiculopathy.  Discussed other modalities.  Given a refresher of some of the exercises for the scapula.  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  Follow-up again in 6 to 8 weeks    Nonallopathic problems  Decision today to treat with OMT was based on Physical Exam  After verbal consent patient was treated with HVLA, ME, FPR  techniques in cervical, rib, thoracic, lumbar, [name], stretches and lifestyle modifications  See medications in patient instructions if given  Patient will follow up in 4-8 weeks     The above documentation has been reviewed and is accurate and complete Judi Saa, DO         Note: This dictation was prepared with Dragon dictation along with smaller phrase technology. Any transcriptional errors that result  from this process are unintentional.

## 2022-08-22 ENCOUNTER — Encounter: Payer: Self-pay | Admitting: Family Medicine

## 2022-08-22 ENCOUNTER — Ambulatory Visit (INDEPENDENT_AMBULATORY_CARE_PROVIDER_SITE_OTHER): Payer: BC Managed Care – PPO | Admitting: Family Medicine

## 2022-08-22 VITALS — BP 118/78 | HR 101 | Ht 67.0 in | Wt 287.0 lb

## 2022-08-22 DIAGNOSIS — M9902 Segmental and somatic dysfunction of thoracic region: Secondary | ICD-10-CM | POA: Diagnosis not present

## 2022-08-22 DIAGNOSIS — M25512 Pain in left shoulder: Secondary | ICD-10-CM

## 2022-08-22 DIAGNOSIS — M9908 Segmental and somatic dysfunction of rib cage: Secondary | ICD-10-CM

## 2022-08-22 DIAGNOSIS — M9901 Segmental and somatic dysfunction of cervical region: Secondary | ICD-10-CM | POA: Diagnosis not present

## 2022-08-22 NOTE — Assessment & Plan Note (Signed)
Left shoulder has more tightness again noted.  Discussed with patient that there could be potentially some cervical radiculopathy.  Discussed other modalities.  Given a refresher of some of the exercises for the scapula.  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  Follow-up again in 6 to 8 weeks

## 2022-08-22 NOTE — Patient Instructions (Signed)
Exercises Yoga wheel See me again in 7-8 weeks

## 2022-10-08 NOTE — Progress Notes (Unsigned)
[name] [address] [place] [address] Phone: [phone] Subjective:   Traci [name], am serving as a scribe for Dr. Antoine Primas.  I'm seeing this patient by the request  of:  Farris Has, MD  CC: Back and neck pain follow-up  [id number]  Traci [name] is a 42 y.o. female coming in with complaint of back and neck pain. OMT 08/22/2022.  Had trigger points in the left shoulder region as well.  Patient states that she still has pain in L shoulder but is manageable. Pain in thoracic spine this morning due to sleeping on the couch.   Medications patient has been prescribed: Zanaflex  Taking:         Reviewed prior external information including notes and imaging from previsou exam, outside providers and external EMR if available.   As well as notes that were available from care everywhere and other healthcare systems.  Past medical history, social, surgical and family history all reviewed in electronic medical record.  No pertanent information unless stated regarding to the chief complaint.   Past Medical History:  Diagnosis Date   Anemia    Anxiety    Blood clot in vein    Right lower leg after surgery   Crohn's disease (HCC)    Depression    Dyspnea    Elevated BP without diagnosis of hypertension    Enlarged liver    GERD (gastroesophageal reflux disease)    Headache    IBS (irritable bowel syndrome)    Laceration of left little finger 02/28/2018   Lower extremity edema    Restless legs    Seasonal allergies    Uterine fibroid    Vitamin B 12 deficiency    Vitamin D deficiency     Allergies  Allergen Reactions   Wellbutrin [Bupropion] Anxiety   Adhesive [Tape]      Review of Systems:  No headache, visual changes, nausea, vomiting, diarrhea, constipation, dizziness, abdominal pain, skin rash, fevers, chills, night sweats, weight loss, swollen lymph nodes, body aches, joint swelling, chest pain, shortness  of breath, mood changes. POSITIVE muscle aches  Objective  There were no vitals taken for this visit.   General: No apparent distress alert and oriented x3 mood and affect normal, [name], [name], non tender, no erythema  Patient back exam still has significant tightness noted on the left side.  Tender to palpation at this time.  Patient does have tightness in the trapezius.  Some limited sidebending left greater than right  Osteopathic findings  C2 flexed rotated and side bent right C7 flexed rotated and side bent left T3 extended rotated and side bent right inhaled rib T7 extended rotated and side bent left L2 flexed rotated and side bent right Sacrum right on right       Assessment and Plan:  No problem-specific Assessment & Plan notes found for this encounter.    Nonallopathic problems  Decision today to treat with OMT was based on Physical Exam  After verbal consent patient was treated with HVLA, ME, [name], rib, thoracic, lumbar, [name], stretches and lifestyle modifications  See medications in patient instructions if given  Patient will follow up in 4-8 weeks  The above documentation has been reviewed and is accurate and complete Judi Saa, DO         Note: This dictation was prepared with Dragon dictation along with smaller phrase technology. Any transcriptional errors that result from this process are unintentional.

## 2022-10-10 ENCOUNTER — Ambulatory Visit: Payer: BC Managed Care – PPO | Admitting: Family Medicine

## 2022-10-10 VITALS — BP 122/88 | HR 101 | Ht 67.0 in | Wt 286.0 lb

## 2022-10-10 DIAGNOSIS — M9901 Segmental and somatic dysfunction of cervical region: Secondary | ICD-10-CM

## 2022-10-10 DIAGNOSIS — G2589 Other specified extrapyramidal and movement disorders: Secondary | ICD-10-CM | POA: Diagnosis not present

## 2022-10-10 DIAGNOSIS — M9903 Segmental and somatic dysfunction of lumbar region: Secondary | ICD-10-CM | POA: Diagnosis not present

## 2022-10-10 DIAGNOSIS — M9902 Segmental and somatic dysfunction of thoracic region: Secondary | ICD-10-CM

## 2022-10-10 DIAGNOSIS — M9904 Segmental and somatic dysfunction of sacral region: Secondary | ICD-10-CM | POA: Diagnosis not present

## 2022-10-10 DIAGNOSIS — M9908 Segmental and somatic dysfunction of rib cage: Secondary | ICD-10-CM | POA: Diagnosis not present

## 2022-10-10 MED ORDER — TIZANIDINE HCL 4 MG PO TABS: 4.0000 mg | ORAL_TABLET | Freq: Every day | ORAL | 0 refills | Status: DC

## 2022-10-10 NOTE — Patient Instructions (Signed)
Refilled zanaflex Use 3 nights in a row when needed See me in 6-8 weeks

## 2022-10-10 NOTE — Assessment & Plan Note (Signed)
Continues to have the scapular dyskinesis on the left side.  Mild aggravation secondary to patient sleeping on the left side.  Also on the couch that likely did not contribute to some of it.  Do not feel that any trigger point injections are necessary at this time.  Will continue to monitor.  Worsening pain can consider advanced imaging but do not think it would actually change management.  Refilled Zanaflex secondary to some of the tightness.  Follow-up again in 6 to 8 weeks

## 2022-10-15 ENCOUNTER — Encounter: Payer: Self-pay | Admitting: Physician Assistant

## 2022-10-15 ENCOUNTER — Ambulatory Visit (INDEPENDENT_AMBULATORY_CARE_PROVIDER_SITE_OTHER): Payer: BC Managed Care – PPO | Admitting: Physician Assistant

## 2022-10-15 DIAGNOSIS — F411 Generalized anxiety disorder: Secondary | ICD-10-CM

## 2022-10-15 DIAGNOSIS — F3341 Major depressive disorder, recurrent, in partial remission: Secondary | ICD-10-CM

## 2022-10-15 DIAGNOSIS — F3281 Premenstrual dysphoric disorder: Secondary | ICD-10-CM

## 2022-10-15 DIAGNOSIS — G2581 Restless legs syndrome: Secondary | ICD-10-CM

## 2022-10-15 DIAGNOSIS — K50919 Crohn's disease, unspecified, with unspecified complications: Secondary | ICD-10-CM | POA: Diagnosis not present

## 2022-10-15 NOTE — Progress Notes (Signed)
[facility]  Patient ID: Traci [name],  MRN: [id number]  PCP: Farris Has, MD  Date of Evaluation: 10/15/2022 time spent:25 minutes  Chief Complaint:  Chief Complaint   Anxiety; Depression; Follow-up   Virtual Visit via Telehealth  I connected with patient by telephone, with their informed consent, and verified patient privacy and that I am speaking with the correct person using two identifiers.  I am private, in my office and the patient is at home.  I discussed the limitations, risks, security and privacy concerns of performing an evaluation and management service by telephone and the availability of in person appointments. I also discussed with the patient that there may be a patient responsible charge related to this service. The patient expressed understanding and agreed to proceed.   I discussed the assessment and treatment plan with the patient. The patient was provided an opportunity to ask questions and all were answered. The patient agreed with the plan and demonstrated an understanding of the instructions.   The patient was advised to call back or seek an in-person evaluation if the symptoms worsen or if the condition fails to improve as anticipated.  I provided 25 minutes of non-face-to-face time during this encounter.  HISTORY/CURRENT STATUS: HPI For routine med check.   She's doing pretty well.  Feels like her meds are working like they should. Patient is able to enjoy things.  Has been traveling some and enjoyed that.  Energy and motivation are good.  Work is stressful, as usual, but ok.  No extreme sadness, tearfulness, or feelings of hopelessness.  Sleeps well most of the time. ADLs and personal hygiene are normal.   Denies any changes in concentration, making decisions, or remembering things.  Appetite has not changed.  Weight is stable.  Denies suicidal or homicidal thoughts.  Anxiety is mostly controlled with current treatment. She takes Klonopin  tid, or she has racing thoughts, ruminates, obsesses about things.   Patient denies increased energy with decreased need for sleep, increased talkativeness, racing thoughts, impulsivity or risky behaviors, increased spending, increased libido, grandiosity, increased irritability or anger, paranoia, or hallucinations.  Denies dizziness, syncope, seizures, numbness, tingling, tremor, tics, unsteady gait, slurred speech, confusion. Denies muscle or joint pain, stiffness, or dystonia. Denies unexplained weight loss, frequent infections, or sores that heal slowly.  No polyphagia, polydipsia, or polyuria. Denies visual changes or paresthesias.   Individual Medical History/ Review of Systems: Changes? :Yes    Crohns is stable, might be getting an IUD d/t fibroids  Past medications for mental health diagnoses include: Zoloft, Xanax, Wellbutrin, Lexapro, Effexor, Luvox, Cymbalta caused more anxiety then depression  Allergies: Wellbutrin [bupropion] and Adhesive [tape]  Current Medications:  Current Outpatient Medications:    acetaminophen (TYLENOL) 500 MG tablet, Take 1,000 mg by mouth 2 (two) times daily as needed. Rapid release, Disp: , Rfl:    azaTHIOprine (IMURAN) 50 MG tablet, Take 125 mg by mouth daily., Disp: , Rfl:    Cholecalciferol 25 MCG (1000 UT) tablet, Take by mouth., Disp: , Rfl:    clonazePAM (KLONOPIN) 0.5 MG tablet, TAKE 1/2 TO 1 TABLET BY MOUTH THREE TIMES DAILY AS NEEDED, Disp: 270 tablet, Rfl: 1   cyanocobalamin (,VITAMIN B-12,) 1000 MCG/ML injection, Inject 1,000 mcg into the muscle every 30 (thirty) days., Disp: , Rfl:    ferrous sulfate 325 (65 FE) MG tablet, Take by mouth., Disp: , Rfl:    fluticasone (FLONASE) 50 MCG/ACT nasal spray, Place into the nose., Disp: , Rfl:  folic acid (FOLVITE) 1 MG tablet, Take 1 mg by mouth daily., Disp: , Rfl:    ibuprofen (ADVIL) 600 MG tablet, Take 600 mg by mouth every 6 (six) hours as needed., Disp: , Rfl:    inFLIXimab (REMICADE IV),  Inject into the vein., Disp: , Rfl:    lamoTRIgine (LAMICTAL) 200 MG tablet, TAKE 1 TABLET(200 MG) BY MOUTH TWICE DAILY, Disp: 180 tablet, Rfl: 3   loratadine (CLARITIN) 10 MG tablet, Take 10 mg by mouth daily., Disp: , Rfl:    Melatonin 10 MG TABS, Take by mouth., Disp: , Rfl:    montelukast (SINGULAIR) 10 MG tablet, TAKE 1 TABLET(10 MG) BY MOUTH DAILY, Disp: , Rfl:    norethindrone (AYGESTIN) 5 MG tablet, Take 2 tablets by mouth daily., Disp: , Rfl:    omeprazole (PRILOSEC) 40 MG capsule, Take 40 mg by mouth daily., Disp: , Rfl:    rOPINIRole (REQUIP) 4 MG tablet, 1-2 as directed, Disp: 180 tablet, Rfl: 3   sertraline (ZOLOFT) 100 MG tablet, TAKE 2 AND 1/2 TABLETS(250 MG) BY MOUTH DAILY, Disp: 450 tablet, Rfl: 0   tiZANidine (ZANAFLEX) 4 MG tablet, Take 1 tablet (4 mg total) by mouth at bedtime., Disp: 30 tablet, Rfl: 0 Medication Side Effects: none  Family Medical/ Social History: Changes? None  MENTAL HEALTH EXAM:  There were no vitals taken for this visit.There is no height or weight on file to calculate BMI.  General Appearance:  unable to assess  Eye Contact:   unable to assess  Speech:  Clear and Coherent and Normal Rate  Volume:  Normal  Mood:  Euthymic  Affect:   unable to assess  Thought Process:  Goal Directed and Descriptions of Associations: Circumstantial  Orientation:  Full (Time, Place, and Person)  Thought Content: Logical   Suicidal Thoughts:  No  Homicidal Thoughts:  No  Memory:  WNL  Judgement:  Good  Insight:  Good  Psychomotor Activity:   unable to assess  Concentration:  Concentration: Good and Attention Span: Good  Recall:  Good  Fund of Knowledge: Good  Language: Good  Assets:  Communication Skills Desire for Improvement Financial Resources/Insurance Housing Transportation Vocational/Educational  ADL's:  Intact  Cognition: WNL  Prognosis:  Good   DIAGNOSES:    ICD-10-CM   1. Recurrent major depressive disorder, in partial remission (HCC)   F33.41     2. Generalized anxiety disorder  F41.1     3. Restless leg syndrome  G25.81     4. PMDD (premenstrual dysphoric disorder)  F32.81     5. Crohn's disease with complication, unspecified gastrointestinal tract location [facility])  K50.919      Receiving Psychotherapy: No   RECOMMENDATIONS:  PDMP reviewed.  No controlled substances listed. I provided 25 minutes of non-face-to-face time during this encounter, including time spent before and after the visit in records review, medical decision making, counseling pertinent to today's visit, and charting.   Waynetta Sandy is doing well with current meds so no changes will be made.   Continue Klonopin 0.5 mg, 1/2-1 tid prn. Continue Lamictal 200 mg, 1 p.o. twice daily. Continue Ropinerole 2 mg, 1-2 p.o. nightly.  (She titrates as need be) Continue  Zoloft 100 mg to 2.5 pills daily. Continue MVI, Vit D, B Complex. Return in 4 months.   Melony Overly, PA-C

## 2022-11-15 ENCOUNTER — Other Ambulatory Visit: Payer: Self-pay | Admitting: Family Medicine

## 2022-11-20 ENCOUNTER — Encounter: Payer: Self-pay | Admitting: Family Medicine

## 2022-11-20 ENCOUNTER — Ambulatory Visit: Payer: BC Managed Care – PPO | Admitting: Family Medicine

## 2022-11-20 VITALS — BP 124/86 | HR 100 | Ht 67.0 in | Wt 288.0 lb

## 2022-11-20 DIAGNOSIS — M9902 Segmental and somatic dysfunction of thoracic region: Secondary | ICD-10-CM

## 2022-11-20 DIAGNOSIS — M9903 Segmental and somatic dysfunction of lumbar region: Secondary | ICD-10-CM

## 2022-11-20 DIAGNOSIS — M9901 Segmental and somatic dysfunction of cervical region: Secondary | ICD-10-CM | POA: Diagnosis not present

## 2022-11-20 DIAGNOSIS — M9908 Segmental and somatic dysfunction of rib cage: Secondary | ICD-10-CM

## 2022-11-20 DIAGNOSIS — M9904 Segmental and somatic dysfunction of sacral region: Secondary | ICD-10-CM | POA: Diagnosis not present

## 2022-11-20 DIAGNOSIS — G2589 Other specified extrapyramidal and movement disorders: Secondary | ICD-10-CM | POA: Diagnosis not present

## 2022-11-20 NOTE — Progress Notes (Signed)
[name] [address] [place] [address] Phone: [phone] Subjective:   Traci [name], am serving as a scribe for Dr. Antoine Primas.  I'm seeing this patient by the request  of:  Farris Has, MD  CC: Back and neck pain follow-up  [name]  Traci [name] is a 42 y.o. female coming in with complaint of back and neck pain. OMT on 10/10/2022. Patient states discomfort on both side of neck. Has been radiating up neck and causing headaches.  Medications patient has been prescribed:   Taking:         Reviewed prior external information including notes and imaging from previsou exam, outside providers and external EMR if available.   As well as notes that were available from care everywhere and other healthcare systems.  Past medical history, social, surgical and family history all reviewed in electronic medical record.  No pertanent information unless stated regarding to the chief complaint.   Past Medical History:  Diagnosis Date   Anemia    Anxiety    Blood clot in vein    Right lower leg after surgery   Crohn's disease (HCC)    Depression    Dyspnea    Elevated BP without diagnosis of hypertension    Enlarged liver    GERD (gastroesophageal reflux disease)    Headache    IBS (irritable bowel syndrome)    Laceration of left little finger 02/28/2018   Lower extremity edema    Restless legs    Seasonal allergies    Uterine fibroid    Vitamin B 12 deficiency    Vitamin D deficiency     Allergies  Allergen Reactions   Wellbutrin [Bupropion] Anxiety   Adhesive [Tape]      Review of Systems:  No headache, visual changes, nausea, vomiting, diarrhea, constipation, dizziness, abdominal pain, skin rash, fevers, chills, night sweats, weight loss, swollen lymph nodes, body aches, joint swelling, chest pain, shortness of breath, mood changes. POSITIVE muscle aches  Objective  Blood pressure 124/86, pulse 100, height 5'  7" (1.702 m), weight 288 lb (130.6 kg), SpO2 98%.   General: No apparent distress alert and oriented x3 mood and affect normal, [name], [name], non tender, no erythema  Gait relatively normal MSK:  Back tightness noted more in the thoracolumbar juncture.  Does have some mild discomfort also noted in the neck area in the cervical thoracic area.  Still though the most tightness seems to be in the parascapular area right greater than left at the moment.  Osteopathic findings  C2 flexed rotated and side bent right C6 flexed rotated and side bent left T3 extended rotated and side bent right inhaled rib T9 extended rotated and side bent left L2 flexed rotated and side bent right Sacrum right on right       Assessment and Plan:  Scapular dyskinesis Unfortunately continues to have significant stiffness and tightness noted in the parascapular area.  Will need to continue to work on [employer] cabin [employer]. Still no significant radicular symptoms that make me concerned for any other nerve impingement at this moment.  Has responded to trigger point injections previously and if needed we will consider.  Follow-up with me again in 6 to 8 weeks. Medications we will continue to prescribe the Zanaflex 4 mg and ibuprofen 600 mg  Nonallopathic problems  Decision today to treat with OMT was based on Physical Exam  After verbal consent patient was treated with HVLA, ME, [name], rib, thoracic, lumbar, [name], stretches and lifestyle modifications  See medications in patient instructions if given  Patient will follow up in 4-8 weeks     The above documentation has been reviewed and is  accurate and complete Judi Saa, DO         Note: This dictation was prepared with Dragon dictation along with smaller phrase technology. Any transcriptional errors that result from this process are unintentional.

## 2022-11-20 NOTE — Patient Instructions (Signed)
Good to see you! Love the [place] shirt See you again in 7-8 weeks

## 2022-11-20 NOTE — Assessment & Plan Note (Signed)
Unfortunately continues to have significant stiffness and tightness noted in the parascapular area.  Will need to continue to work on [employer] cabin [employer]. Still no significant radicular symptoms that make me concerned for any other nerve impingement at this moment.  Has responded to trigger point injections previously and if needed we will consider.  Follow-up with me again in 6 to 8 weeks.

## 2022-11-21 ENCOUNTER — Ambulatory Visit: Payer: BC Managed Care – PPO | Admitting: Family Medicine

## 2022-12-10 ENCOUNTER — Other Ambulatory Visit: Payer: Self-pay | Admitting: Physician Assistant

## 2022-12-11 ENCOUNTER — Other Ambulatory Visit: Payer: Self-pay

## 2022-12-11 NOTE — Telephone Encounter (Signed)
Last refill 09/17/2022 #270 Please send while provider out.

## 2023-01-09 ENCOUNTER — Encounter: Payer: Self-pay | Admitting: Family Medicine

## 2023-01-09 ENCOUNTER — Ambulatory Visit: Payer: BC Managed Care – PPO | Admitting: Family Medicine

## 2023-01-09 VITALS — BP 112/80 | HR 94 | Ht 67.0 in | Wt 298.0 lb

## 2023-01-09 DIAGNOSIS — G2589 Other specified extrapyramidal and movement disorders: Secondary | ICD-10-CM

## 2023-01-09 DIAGNOSIS — M9902 Segmental and somatic dysfunction of thoracic region: Secondary | ICD-10-CM | POA: Diagnosis not present

## 2023-01-09 DIAGNOSIS — M9904 Segmental and somatic dysfunction of sacral region: Secondary | ICD-10-CM | POA: Diagnosis not present

## 2023-01-09 DIAGNOSIS — M9903 Segmental and somatic dysfunction of lumbar region: Secondary | ICD-10-CM | POA: Diagnosis not present

## 2023-01-09 DIAGNOSIS — M9908 Segmental and somatic dysfunction of rib cage: Secondary | ICD-10-CM | POA: Diagnosis not present

## 2023-01-09 DIAGNOSIS — M9901 Segmental and somatic dysfunction of cervical region: Secondary | ICD-10-CM | POA: Diagnosis not present

## 2023-01-09 MED ORDER — METHYLPREDNISOLONE ACETATE 80 MG/ML IJ SUSP
80.0000 mg | Freq: Once | INTRAMUSCULAR | Status: AC
  Administered 2023-01-09: 80 mg via INTRAMUSCULAR

## 2023-01-09 MED ORDER — KETOROLAC TROMETHAMINE 60 MG/2ML IM SOLN
60.0000 mg | Freq: Once | INTRAMUSCULAR | Status: AC
Start: 2023-01-09 — End: 2023-01-09
  Administered 2023-01-09: 60 mg via INTRAMUSCULAR

## 2023-01-09 NOTE — Progress Notes (Signed)
[name] [address] [place] [address] Phone: [phone] Subjective:   Traci [name], am serving as a scribe for Dr. Antoine [name].  I'm seeing this patient by the request  of:  Farris Has, MD  CC: Left scapula, neck pain and back pain  [id number]  Traci [name] is a 42 y.o. female coming in with complaint of back and neck pain. OMT 11/20/2022. Patient states that her L scapula is more painful than last visit. Started on Monday.   Medications patient has been prescribed: Zanaflex  Taking:         Reviewed prior external information including notes and imaging from previsou exam, outside providers and external EMR if available.   As well as notes that were available from care everywhere and other healthcare systems.  Past medical history, social, surgical and family history all reviewed in electronic medical record.  No pertanent information unless stated regarding to the chief complaint.   Past Medical History:  Diagnosis Date   Anemia    Anxiety    Blood clot in vein    Right lower leg after surgery   Crohn's disease (HCC)    Depression    Dyspnea    Elevated BP without diagnosis of hypertension    Enlarged liver    GERD (gastroesophageal reflux disease)    Headache    IBS (irritable bowel syndrome)    Laceration of left little finger 02/28/2018   Lower extremity edema    Restless legs    Seasonal allergies    Uterine fibroid    Vitamin B 12 deficiency    Vitamin D deficiency     Allergies  Allergen Reactions   Wellbutrin [Bupropion] Anxiety   Adhesive [Tape]      Review of Systems:  No headache, visual changes, nausea, vomiting, diarrhea, constipation, dizziness, abdominal pain, skin rash, fevers, chills, night sweats, weight loss, swollen lymph nodes, body aches, joint swelling, chest pain, shortness of breath, mood changes. POSITIVE muscle aches  Objective  Blood pressure 112/80, pulse 94, height  5\' 7"  (1.702 m), weight 298 lb (135.2 kg), SpO2 98%.   General: No apparent distress alert and oriented x3 mood and affect normal, [name], extraocular movements intact  Respiratory: Patient's speak in full sentences and does not appear short of breath  Significant tightness noted in the left scapular area.  Seems to also have tightness in the left neck area.  Tenderness to palpation noted.  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  Could be more of a cervical radiculopathy.  Toradol and Depo-Medrol injections given today.  Hopefully this will make some improvement.  Patient has had some more stress recently and this could be also contributing.  Discussed icing regimen and the home exercises.  Increase activity slowly over the course of next several weeks.  Follow-up again in 6 to 8 weeks otherwise.    Nonallopathic problems  Decision today to treat with OMT was based on Physical Exam  After verbal consent patient was treated with HVLA, ME, [name], rib, thoracic, lumbar, and sacral  areas  Patient tolerated the procedure  well with improvement in symptoms  Patient given exercises, stretches and lifestyle modifications  See medications in patient instructions if given  Patient will follow up in 4-8 weeks     The above documentation has been reviewed and is accurate and complete Judi Saa, DO         Note: This dictation was prepared with Dragon dictation along with smaller phrase technology. Any transcriptional errors that result from this process are unintentional.

## 2023-01-09 NOTE — Assessment & Plan Note (Signed)
Chronic problem with worsening symptoms.  Could be more of a cervical radiculopathy.  Toradol and Depo-Medrol injections given today.  Hopefully this will make some improvement.  Patient has had some more stress recently and this could be also contributing.  Discussed icing regimen and the home exercises.  Increase activity slowly over the course of next several weeks.  Follow-up again in 6 to 8 weeks otherwise.

## 2023-01-09 NOTE — Patient Instructions (Signed)
Good to see you Injections in backside  See me in 5-6 weeks

## 2023-01-24 ENCOUNTER — Ambulatory Visit: Payer: BC Managed Care – PPO | Admitting: Family Medicine

## 2023-02-07 NOTE — Progress Notes (Signed)
[name] [address] [place] [address] Phone: [phone] Subjective:   [name], am serving as a scribe for Dr. Antoine Primas.  I'm seeing this patient by the request  of:  Farris Has, MD  CC: back and neck pain follow up   [id number]  Traci [name] is a 42 y.o. female coming in with complaint of back and neck pain. OMT on 01/09/2023. Patient states pain over spine in low back. Neck also weird from sleeping on couch.          Reviewed prior external information including notes and imaging from previsou exam, outside providers and external EMR if available.   As well as notes that were available from care everywhere and other healthcare systems.  Past medical history, social, surgical and family history all reviewed in electronic medical record.  No pertanent information unless stated regarding to the chief complaint.   Past Medical History:  Diagnosis Date   Anemia    Anxiety    Blood clot in vein    Right lower leg after surgery   Crohn's disease (HCC)    Depression    Dyspnea    Elevated BP without diagnosis of hypertension    Enlarged liver    GERD (gastroesophageal reflux disease)    Headache    IBS (irritable bowel syndrome)    Laceration of left little finger 02/28/2018   Lower extremity edema    Restless legs    Seasonal allergies    Uterine fibroid    Vitamin B 12 deficiency    Vitamin D deficiency     Allergies  Allergen Reactions   Wellbutrin [Bupropion] Anxiety   Adhesive [Tape]      Review of Systems:  No headache, visual changes, nausea, vomiting, diarrhea, constipation, dizziness, abdominal pain, skin rash, fevers, chills, night sweats, weight loss, swollen lymph nodes, body aches, joint swelling, chest pain, shortness of breath, mood changes. POSITIVE muscle aches  Objective  Blood pressure 118/88, pulse (!) 104, height 5\' 7"  (1.702 m), weight 294 lb (133.4 kg), SpO2 97%.   General:  No apparent distress alert and oriented x3 mood and affect normal, [name], [name], non tender, no erythema  MSK:  Back does have significant tenderness over the sacroiliac joint bilaterally.  Does have some pain over the abdomen but very minimal.  Seems to be more in the sacroiliac joint.  Osteopathic findings  C2 flexed rotated and side bent right C6 flexed rotated and side bent left T3 extended rotated and side bent right inhaled rib T9 extended rotated and side bent left L2 flexed rotated and side bent right L5 flexed rotated and side bent left Sacrum treated for flexion     Assessment and Plan:  Low back pain Low back does have some loss of lordosis noted.  Some tenderness to palpation in the paraspinal musculature.  Patient does have some decrease in extension of the back noted and secondary to some voluntary guarding.  Poor core strength still noted.    Nonallopathic problems  Decision today to treat with OMT was based on Physical Exam  After verbal consent patient was treated with HVLA, ME, [name], rib, thoracic, lumbar, [name], stretches and lifestyle  modifications  See medications in patient instructions if given  Patient will follow up in 4-8 weeks     The above documentation has been reviewed and is accurate and complete Judi Saa, DO         Note: This dictation was prepared with Dragon dictation along with smaller phrase technology. Any transcriptional errors that result from this process are unintentional.

## 2023-02-08 ENCOUNTER — Ambulatory Visit: Payer: BC Managed Care – PPO | Admitting: Family Medicine

## 2023-02-08 ENCOUNTER — Encounter: Payer: Self-pay | Admitting: Family Medicine

## 2023-02-08 VITALS — BP 118/88 | HR 104 | Ht 67.0 in | Wt 294.0 lb

## 2023-02-08 DIAGNOSIS — M9901 Segmental and somatic dysfunction of cervical region: Secondary | ICD-10-CM | POA: Diagnosis not present

## 2023-02-08 DIAGNOSIS — M9908 Segmental and somatic dysfunction of rib cage: Secondary | ICD-10-CM | POA: Diagnosis not present

## 2023-02-08 DIAGNOSIS — M9903 Segmental and somatic dysfunction of lumbar region: Secondary | ICD-10-CM | POA: Diagnosis not present

## 2023-02-08 DIAGNOSIS — M9902 Segmental and somatic dysfunction of thoracic region: Secondary | ICD-10-CM

## 2023-02-08 DIAGNOSIS — M9904 Segmental and somatic dysfunction of sacral region: Secondary | ICD-10-CM

## 2023-02-08 DIAGNOSIS — M545 Low back pain, unspecified: Secondary | ICD-10-CM | POA: Diagnosis not present

## 2023-02-08 NOTE — Assessment & Plan Note (Signed)
Low back does have some loss of lordosis noted.  Some tenderness to palpation in the paraspinal musculature.  Patient does have some decrease in extension of the back noted and secondary to some voluntary guarding.  Poor core strength still noted.

## 2023-02-08 NOTE — Patient Instructions (Addendum)
Xray Later Party too hard tomorrow See me as scheduled

## 2023-02-19 ENCOUNTER — Other Ambulatory Visit: Payer: Self-pay | Admitting: Physician Assistant

## 2023-02-20 ENCOUNTER — Other Ambulatory Visit: Payer: Self-pay | Admitting: Physician Assistant

## 2023-02-20 ENCOUNTER — Encounter: Payer: Self-pay | Admitting: Physician Assistant

## 2023-02-20 ENCOUNTER — Ambulatory Visit: Payer: BC Managed Care – PPO | Admitting: Physician Assistant

## 2023-02-20 ENCOUNTER — Ambulatory Visit: Payer: BC Managed Care – PPO | Admitting: Family Medicine

## 2023-02-20 DIAGNOSIS — E559 Vitamin D deficiency, unspecified: Secondary | ICD-10-CM | POA: Diagnosis not present

## 2023-02-20 DIAGNOSIS — F411 Generalized anxiety disorder: Secondary | ICD-10-CM | POA: Diagnosis not present

## 2023-02-20 DIAGNOSIS — R5381 Other malaise: Secondary | ICD-10-CM

## 2023-02-20 DIAGNOSIS — G2581 Restless legs syndrome: Secondary | ICD-10-CM

## 2023-02-20 DIAGNOSIS — K50919 Crohn's disease, unspecified, with unspecified complications: Secondary | ICD-10-CM

## 2023-02-20 DIAGNOSIS — F3341 Major depressive disorder, recurrent, in partial remission: Secondary | ICD-10-CM

## 2023-02-20 MED ORDER — CLONAZEPAM 0.5 MG PO TABS: ORAL_TABLET | ORAL | 1 refills | Status: DC

## 2023-02-20 MED ORDER — GABAPENTIN 600 MG PO TABS: 600.0000 mg | ORAL_TABLET | Freq: Every day | ORAL | 1 refills | Status: DC

## 2023-02-20 MED ORDER — ROPINIROLE HCL 4 MG PO TABS: ORAL_TABLET | ORAL | Status: DC

## 2023-02-20 NOTE — Progress Notes (Signed)
[facility]  Patient ID: Traci [name],  MRN: [id number]  PCP: Farris Has, MD  Date of Evaluation: 02/20/2023 time spent:30 minutes  Chief Complaint:  Chief Complaint   Anxiety; Depression; Follow-up    HISTORY/CURRENT STATUS: HPI For routine med check.   Has had to go back up on the ropinerole d/t worsening of RLS. She does not like to take it.  It makes her feel drained but if she does not take it her legs start moving and she feels really uncomfortable, starting a few hours before she goes to sleep each night.  That affects her ability to fall asleep and stay asleep.  She had a CBC recently and her hemoglobin and hematocrit were normal.  She has been told to take iron because of the Crohn's disease but she does not like to take it and since her hemoglobin and hematocrit were within normal limits she has not been taking the iron.  States her mood is stable for the most part.  Still stressful situation with her mother-in-law, who is now engaged to a much younger man, it has been a sad situation since patient's father-in-law died 1-1/2 years ago.  Traci [name] is sad for her husband who has to deal with all this.  Work is still stressful, she works at [employer] in [employer], and they do not have enough staff.  With all that being said her mood is stable. Patient is able to enjoy things.  Is tired a lot.  No extreme sadness, tearfulness, or feelings of hopelessness.  Sleeps well most of the time. ADLs and personal hygiene are normal.   Denies any changes in concentration, making decisions, or remembering things.  Appetite has not changed.  Weight is stable.  Anxiety is well controlled.  The Klonopin still helps.  Denies suicidal or homicidal thoughts.  Patient denies increased energy with decreased need for sleep, increased talkativeness, racing thoughts, impulsivity or risky behaviors, increased spending, increased libido, grandiosity, increased irritability or anger,  paranoia, or hallucinations.  Denies dizziness, syncope, seizures, numbness, tingling, tremor, tics, unsteady gait, slurred speech, confusion. Denies muscle or joint pain, stiffness, or dystonia. Denies unexplained weight loss, frequent infections, or sores that heal slowly.  No polyphagia, polydipsia, or polyuria. Denies visual changes or paresthesias.   Individual Medical History/ Review of Systems: Changes? :Yes    RLS is worse  Past medications for mental health diagnoses include: Zoloft, Xanax, Wellbutrin, Lexapro, Effexor, Luvox, Cymbalta caused more anxiety then depression, Ropinerole, Gabapentin worked but quit working after [employer]  Allergies: Wellbutrin [bupropion] and Adhesive [tape]  Current Medications:  Current Outpatient Medications:    acetaminophen (TYLENOL) 500 MG tablet, Take 1,000 mg by mouth 2 (two) times daily as needed. Rapid release, Disp: , Rfl:    azaTHIOprine (IMURAN) 50 MG tablet, Take 125 mg by mouth daily., Disp: , Rfl:    Cholecalciferol 25 MCG (1000 UT) tablet, Take by mouth., Disp: , Rfl:    cyanocobalamin (,VITAMIN B-12,) 1000 MCG/ML injection, Inject 1,000 mcg into the muscle every 30 (thirty) days., Disp: , Rfl:    fluticasone (FLONASE) 50 MCG/ACT nasal spray, Place into the nose., Disp: , Rfl:    folic acid (FOLVITE) 1 MG tablet, Take 1 mg by mouth daily., Disp: , Rfl:    gabapentin (NEURONTIN) 600 MG tablet, Take 1 tablet (600 mg total) by mouth at bedtime., Disp: 30 tablet, Rfl: 1   inFLIXimab-axxq (AVSOLA IV), Inject into the vein. Q 7 weeks, Disp: , Rfl:  lamoTRIgine (LAMICTAL) 200 MG tablet, TAKE 1 TABLET(200 MG) BY MOUTH TWICE DAILY, Disp: 180 tablet, Rfl: 3   loratadine (CLARITIN) 10 MG tablet, Take 10 mg by mouth daily., Disp: , Rfl:    Melatonin 10 MG TABS, Take by mouth., Disp: , Rfl:    montelukast (SINGULAIR) 10 MG tablet, TAKE 1 TABLET(10 MG) BY MOUTH DAILY, Disp: , Rfl:    omeprazole (PRILOSEC) 40 MG capsule, Take 40 mg by mouth daily.,  Disp: , Rfl:    rOPINIRole (REQUIP) 4 MG tablet, 1-2 as directed, Disp: 180 tablet, Rfl: 3   sertraline (ZOLOFT) 100 MG tablet, TAKE 2 AND 1/2 TABLETS(250 MG) BY MOUTH DAILY, Disp: 450 tablet, Rfl: 0   tiZANidine (ZANAFLEX) 4 MG tablet, TAKE 1 TABLET(4 MG) BY MOUTH AT BEDTIME, Disp: 30 tablet, Rfl: 0   clonazePAM (KLONOPIN) 0.5 MG tablet, TAKE 1/2 TO 1 TABLET BY MOUTH THREE TIMES DAILY AS NEEDED, Disp: 270 tablet, Rfl: 1   ferrous sulfate 325 (65 FE) MG tablet, Take by mouth. (Patient not taking: Reported on 02/20/2023), Disp: , Rfl:    inFLIXimab (REMICADE IV), Inject into the vein. (Patient not taking: Reported on 02/20/2023), Disp: , Rfl:  Medication Side Effects: none  Family Medical/ Social History: Changes? None  MENTAL HEALTH EXAM:  There were no vitals taken for this visit.There is no height or weight on file to calculate BMI.  General Appearance: Casual, Well Groomed, and Obese  Eye Contact:  Good  Speech:  Clear and Coherent and Normal Rate  Volume:  Normal  Mood:  Euthymic  Affect:  Congruent  Thought Process:  Goal Directed and Descriptions of Associations: Circumstantial  Orientation:  Full (Time, Place, and Person)  Thought Content: Logical   Suicidal Thoughts:  No  Homicidal Thoughts:  No  Memory:  WNL  Judgement:  Good  Insight:  Good  Psychomotor Activity:  Normal  Concentration:  Concentration: Good and Attention Span: Good  Recall:  Good  Fund of Knowledge: Good  Language: Good  Assets:  Communication Skills Desire for Improvement Financial Resources/Insurance Housing Transportation Vocational/Educational  ADL's:  Intact  Cognition: WNL  Prognosis:  Good   DIAGNOSES:    ICD-10-CM   1. Recurrent major depressive disorder, in partial remission (HCC)  F33.41     2. Vitamin D deficiency  E55.9     3. Malaise and fatigue  R53.81    R53.83     4. Restless leg syndrome  G25.81     5. Generalized anxiety disorder  F41.1     6. Crohn's disease with  complication, unspecified gastrointestinal tract location [facility])  K50.919      Receiving Psychotherapy: No   RECOMMENDATIONS:  PDMP reviewed.  No controlled substances listed. I provided 30 minutes of face to face time during this encounter, including time spent before and after the visit in records review, medical decision making, counseling pertinent to today's visit, and charting.   We discussed the fatigue and worsening of restless leg syndrome.  I recommend checking labs including CBC with differential, anemia profile containing ferritin, reteach count, TIBC, B12 and folate, vitamin D level, and TSH.  Other recent labs are on the chart.  These orders were written for her on a prescription pad, she will be able to have them done at work.  Options to treat the restless leg syndrome include weaning off the ropinirole and retrying gabapentin.  She does not exactly remember what happened when she took it in the past, maybe it  quit working after a while.  She is not sure but is willing to try it again.  That is my first recommendation.  Another option would be to try pramipexole.  If she is not able to tolerate the gabapentin or it is not effective then we can change to pramipexole.  Continue Klonopin 0.5 mg, 1/2-1 tid prn. Start gabapentin 600 mg, one half p.o. around 4 hours before bedtime for 1 week and then increase to 1 pill about 4 hours before bed. Continue Lamictal 200 mg, 1 p.o. twice daily. Decrease the ropinirole to 3 mg nightly for 1 week then 2 mg nightly for 1 week then 1 mg nightly for 1 week and then stop.  Continue  Zoloft 100 mg to 2.5 pills daily. Continue MVI, Vit D, B Complex. Return in 6 weeks.  Melony Overly, PA-C

## 2023-02-21 ENCOUNTER — Encounter: Payer: Self-pay | Admitting: Physician Assistant

## 2023-02-21 LAB — VITAMIN B12: Vitamin B-12: 368 pg/mL (ref 200–1100)

## 2023-02-21 LAB — CBC WITH DIFFERENTIAL/PLATELET
Absolute Lymphocytes: 2091 {cells}/uL (ref 850–3900)
Absolute Monocytes: 672 {cells}/uL (ref 200–950)
Basophils Absolute: 49 {cells}/uL (ref 0–200)
Basophils Relative: 0.6 %
Eosinophils Absolute: 156 {cells}/uL (ref 15–500)
Eosinophils Relative: 1.9 %
HCT: 46.2 % — ABNORMAL HIGH (ref 35.0–45.0)
Hemoglobin: 15.3 g/dL (ref 11.7–15.5)
MCH: 29.4 pg (ref 27.0–33.0)
MCHC: 33.1 g/dL (ref 32.0–36.0)
MCV: 88.7 fL (ref 80.0–100.0)
MPV: 10 fL (ref 7.5–12.5)
Monocytes Relative: 8.2 %
Neutro Abs: 5232 {cells}/uL (ref 1500–7800)
Neutrophils Relative %: 63.8 %
Platelets: 283 10*3/uL (ref 140–400)
RBC: 5.21 10*6/uL — ABNORMAL HIGH (ref 3.80–5.10)
RDW: 12.3 % (ref 11.0–15.0)
Total Lymphocyte: 25.5 %
WBC: 8.2 10*3/uL (ref 3.8–10.8)

## 2023-02-21 LAB — RETICULOCYTES
ABS Retic: 98990 {cells}/uL — ABNORMAL HIGH (ref 20000–80000)
Retic Ct Pct: 1.9 %

## 2023-02-21 LAB — VITAMIN D 25 HYDROXY (VIT D DEFICIENCY, FRACTURES): Vit D, 25-Hydroxy: 24 ng/mL — ABNORMAL LOW (ref 30–100)

## 2023-02-21 LAB — TSH: TSH: 1.25 m[IU]/L

## 2023-02-21 LAB — IRON, TOTAL/TOTAL IRON BINDING CAP
%SAT: 21 % (ref 16–45)
Iron: 84 ug/dL (ref 40–190)
TIBC: 392 ug/dL (ref 250–450)

## 2023-02-21 LAB — FOLATE: Folate: >24 ng/mL

## 2023-02-21 LAB — FERRITIN: Ferritin: 59 ng/mL (ref 16–154)

## 2023-02-25 NOTE — Progress Notes (Signed)
Was there another page that has a Vitamin D level on it?

## 2023-02-26 ENCOUNTER — Encounter: Payer: Self-pay | Admitting: Physician Assistant

## 2023-02-26 ENCOUNTER — Other Ambulatory Visit: Payer: Self-pay | Admitting: Physician Assistant

## 2023-02-26 MED ORDER — VITAMIN D (ERGOCALCIFEROL) 1.25 MG (50000 UNIT) PO CAPS: 50000.0000 [IU] | ORAL_CAPSULE | ORAL | 0 refills | Status: AC

## 2023-02-26 NOTE — Progress Notes (Signed)
Never mind on the previous message.  This just came through.

## 2023-02-26 NOTE — Progress Notes (Signed)
Sorry I was out yesterday so I'm just seeing this. No I don't think so I scanned what I had in. I usuaslly give them to Traci I can check with her as well keep a look out to see if comes in.

## 2023-02-26 NOTE — Progress Notes (Signed)
No problem, thanks Ebonee. I'll include her on this message.

## 2023-02-26 NOTE — Progress Notes (Signed)
Note was sent to pt

## 2023-02-26 NOTE — Progress Notes (Signed)
Can you check and see if there's another page to her labs? We're missing a Vitamin D level.  Thanks.

## 2023-03-15 ENCOUNTER — Ambulatory Visit (INDEPENDENT_AMBULATORY_CARE_PROVIDER_SITE_OTHER): Payer: BC Managed Care – PPO

## 2023-03-15 ENCOUNTER — Ambulatory Visit: Payer: BC Managed Care – PPO | Admitting: Family Medicine

## 2023-03-15 ENCOUNTER — Other Ambulatory Visit: Payer: Self-pay

## 2023-03-15 ENCOUNTER — Encounter: Payer: Self-pay | Admitting: Family Medicine

## 2023-03-15 VITALS — BP 124/84 | HR 87 | Ht 67.0 in | Wt 294.0 lb

## 2023-03-15 DIAGNOSIS — M9901 Segmental and somatic dysfunction of cervical region: Secondary | ICD-10-CM

## 2023-03-15 DIAGNOSIS — M9904 Segmental and somatic dysfunction of sacral region: Secondary | ICD-10-CM

## 2023-03-15 DIAGNOSIS — M25511 Pain in right shoulder: Secondary | ICD-10-CM

## 2023-03-15 DIAGNOSIS — M9902 Segmental and somatic dysfunction of thoracic region: Secondary | ICD-10-CM | POA: Diagnosis not present

## 2023-03-15 DIAGNOSIS — M9903 Segmental and somatic dysfunction of lumbar region: Secondary | ICD-10-CM | POA: Diagnosis not present

## 2023-03-15 DIAGNOSIS — M9908 Segmental and somatic dysfunction of rib cage: Secondary | ICD-10-CM

## 2023-03-15 DIAGNOSIS — G2589 Other specified extrapyramidal and movement disorders: Secondary | ICD-10-CM

## 2023-03-15 NOTE — Assessment & Plan Note (Signed)
Continue to have difficulty at this moment.  Will get x-rays of the shoulder to make sure nothing else is contributing.  Has some signs that is consistent with possible labral pathology.  We may need to discuss some which activities to do and which ones to avoid.  Increase activity slowly otherwise.  Follow-up with me again 6 to 8 weeks.  Worsening pain will consider injection in the shoulder.

## 2023-03-15 NOTE — Patient Instructions (Signed)
Thanks for letting me make jokes Xray R shoulder Scapular exercises See me in 7-8 weeks

## 2023-03-15 NOTE — Progress Notes (Signed)
[name] [address] [place] [address] Phone: [phone] Subjective:   Traci [name], am serving as a scribe for Dr. Antoine Primas.  I'm seeing this patient by the request  of:  Farris Has, MD  CC: Neck and right shoulder pain  [id number]  [name] is a 42 y.o. female coming in with complaint of back and neck pain. R shoulder pain f/u as well. OMT 02/08/2023. Patient states that her R shoulder is getting worse. Pain in L shoulder is radiating into the neck.   Medications patient has been prescribed: Zanaflex  Taking: Intermittently         Reviewed prior external information including notes and imaging from previsou exam, outside providers and external EMR if available.   As well as notes that were available from care everywhere and other healthcare systems.  Past medical history, social, surgical and family history all reviewed in electronic medical record.  No pertanent information unless stated regarding to the chief complaint.   Past Medical History:  Diagnosis Date   Anemia    Anxiety    Blood clot in vein    Right lower leg after surgery   Crohn's disease (HCC)    Depression    Dyspnea    Elevated BP without diagnosis of hypertension    Enlarged liver    GERD (gastroesophageal reflux disease)    Headache    IBS (irritable bowel syndrome)    Laceration of left little finger 02/28/2018   Lower extremity edema    Restless legs    Seasonal allergies    Uterine fibroid    Vitamin B 12 deficiency    Vitamin D deficiency     Allergies  Allergen Reactions   Wellbutrin [Bupropion] Anxiety   Adhesive [Tape]      Review of Systems:  No headache, visual changes, nausea, vomiting, diarrhea, constipation, dizziness, abdominal pain, skin rash, fevers, chills, night sweats, weight loss, swollen lymph nodes, body aches, joint swelling, chest pain, shortness of breath, mood changes. POSITIVE muscle  aches  Objective  Blood pressure 124/84, pulse 87, height 5\' 7"  (1.702 m), weight 294 lb (133.4 kg), SpO2 98%.   General: No apparent distress alert and oriented x3 mood and affect normal, [name], [name], non tender, no erythema  Neck exam does have significant tightness noted.  Some limited sidebending bilaterally.  There are some mild crepitus noted.  Osteopathic findings  C3 flexed rotated and side bent right C6 flexed rotated and side bent left C7 flexed rotated and side bent right T3 extended rotated and side bent right inhaled rib T8 extended rotated and side bent left L1 flexed rotated and side bent right Sacrum right on right    Assessment and Plan:  Scapular dyskinesis Continue to have difficulty at this moment.  Will get x-rays of the shoulder to make sure nothing else is contributing.  Has some signs that is consistent with possible labral pathology.  We may need to discuss some which activities to do and which ones to avoid.  Increase activity slowly otherwise.  Follow-up with me again 6 to 8 weeks.  Worsening pain will consider injection in the shoulder.    Nonallopathic problems  Decision today to treat with OMT was based on Physical Exam  After verbal consent patient was treated with HVLA,  ME, [name], rib, thoracic, lumbar, [name], stretches and lifestyle modifications  See medications in patient instructions if given  Patient will follow up in 4-8 weeks     The above documentation has been reviewed and is accurate and complete Judi Saa, DO         Note: This dictation was prepared with Dragon dictation along with smaller phrase technology.  Any transcriptional errors that result from this process are unintentional.

## 2023-03-22 ENCOUNTER — Encounter: Payer: Self-pay | Admitting: Family Medicine

## 2023-03-25 ENCOUNTER — Other Ambulatory Visit: Payer: Self-pay

## 2023-03-25 DIAGNOSIS — M25511 Pain in right shoulder: Secondary | ICD-10-CM

## 2023-04-09 ENCOUNTER — Other Ambulatory Visit: Payer: BC Managed Care – PPO

## 2023-04-10 ENCOUNTER — Encounter: Payer: Self-pay | Admitting: Physician Assistant

## 2023-04-10 ENCOUNTER — Ambulatory Visit: Payer: BC Managed Care – PPO | Admitting: Physician Assistant

## 2023-04-10 DIAGNOSIS — K50919 Crohn's disease, unspecified, with unspecified complications: Secondary | ICD-10-CM | POA: Diagnosis not present

## 2023-04-10 DIAGNOSIS — F3341 Major depressive disorder, recurrent, in partial remission: Secondary | ICD-10-CM

## 2023-04-10 DIAGNOSIS — F411 Generalized anxiety disorder: Secondary | ICD-10-CM | POA: Diagnosis not present

## 2023-04-10 DIAGNOSIS — G2581 Restless legs syndrome: Secondary | ICD-10-CM

## 2023-04-10 MED ORDER — GABAPENTIN 600 MG PO TABS: 600.0000 mg | ORAL_TABLET | Freq: Every day | ORAL | 1 refills | Status: DC

## 2023-04-10 NOTE — Progress Notes (Signed)
[facility]  Patient ID: Traci [name],  MRN: [id number]  PCP: Farris Has, MD  Date of Evaluation: 04/10/2023 time spent:25 minutes  Chief Complaint:  Chief Complaint   Follow-up    HISTORY/CURRENT STATUS: HPI For routine med check.   Doing much better since changing Ropinerole to Gabapentin. Legs don't jump around and aren't uncomfortable like they were.   Patient is able to enjoy things.  Energy and motivation are good.  Work is stressful as always, still very short-handed.    No extreme sadness, tearfulness, or feelings of hopelessness.  Several of her cousins and friends are pregnant which is hard for her b/c she hasn't been able to get pregnant.  Sleeps well most of the time. ADLs and personal hygiene are normal.   Denies any changes in concentration, making decisions, or remembering things.  Appetite has not changed.  Weight is stable.  Anxiety is well-controlled.  Gets overwhelmed easily, Klonopin helps.  Not having PA.  Denies suicidal or homicidal thoughts.  Patient denies increased energy with decreased need for sleep, increased talkativeness, racing thoughts, impulsivity or risky behaviors, increased spending, increased libido, grandiosity, increased irritability or anger, paranoia, or hallucinations.  Denies dizziness, syncope, seizures, numbness, tingling, tremor, tics, unsteady gait, slurred speech, confusion. Denies muscle or joint pain, stiffness, or dystonia. Denies unexplained weight loss, frequent infections, or sores that heal slowly.  No polyphagia, polydipsia, or polyuria. Denies visual changes or paresthesias.   Individual Medical History/ Review of Systems: Changes? :Yes      Past medications for mental health diagnoses include: Zoloft, Xanax, Wellbutrin, Lexapro, Effexor, Luvox, Cymbalta caused more anxiety then depression, Ropinerole, Gabapentin worked but quit working after [employer]  Allergies: Wellbutrin [bupropion] and Adhesive  [tape]  Current Medications:  Current Outpatient Medications:    acetaminophen (TYLENOL) 500 MG tablet, Take 1,000 mg by mouth 2 (two) times daily as needed. Rapid release, Disp: , Rfl:    azaTHIOprine (IMURAN) 50 MG tablet, Take 125 mg by mouth daily., Disp: , Rfl:    Cholecalciferol 25 MCG (1000 UT) tablet, Take by mouth., Disp: , Rfl:    clonazePAM (KLONOPIN) 0.5 MG tablet, TAKE 1/2 TO 1 TABLET BY MOUTH THREE TIMES DAILY AS NEEDED, Disp: 270 tablet, Rfl: 1   cyanocobalamin (,VITAMIN B-12,) 1000 MCG/ML injection, Inject 1,000 mcg into the muscle every 30 (thirty) days., Disp: , Rfl:    ferrous sulfate 325 (65 FE) MG tablet, Take by mouth., Disp: , Rfl:    fluticasone (FLONASE) 50 MCG/ACT nasal spray, Place into the nose., Disp: , Rfl:    folic acid (FOLVITE) 1 MG tablet, Take 1 mg by mouth daily., Disp: , Rfl:    inFLIXimab-axxq (AVSOLA IV), Inject into the vein. Q 7 weeks, Disp: , Rfl:    lamoTRIgine (LAMICTAL) 200 MG tablet, TAKE 1 TABLET(200 MG) BY MOUTH TWICE DAILY, Disp: 180 tablet, Rfl: 3   loratadine (CLARITIN) 10 MG tablet, Take 10 mg by mouth daily., Disp: , Rfl:    Melatonin 10 MG TABS, Take by mouth., Disp: , Rfl:    montelukast (SINGULAIR) 10 MG tablet, TAKE 1 TABLET(10 MG) BY MOUTH DAILY, Disp: , Rfl:    omeprazole (PRILOSEC) 40 MG capsule, Take 40 mg by mouth daily., Disp: , Rfl:    sertraline (ZOLOFT) 100 MG tablet, TAKE 2 AND 1/2 TABLET BY MOUTH DAILY, Disp: 225 tablet, Rfl: 0   tiZANidine (ZANAFLEX) 4 MG tablet, TAKE 1 TABLET(4 MG) BY MOUTH AT BEDTIME, Disp: 30 tablet, Rfl: 0  Vitamin D, Ergocalciferol, (DRISDOL) 1.25 MG (50000 UNIT) CAPS capsule, Take 1 capsule (50,000 Units total) by mouth every 7 (seven) days., Disp: 12 capsule, Rfl: 0   gabapentin (NEURONTIN) 600 MG tablet, Take 1 tablet (600 mg total) by mouth at bedtime., Disp: 90 tablet, Rfl: 1   inFLIXimab (REMICADE IV), Inject into the vein. (Patient not taking: Reported on 04/10/2023), Disp: , Rfl:  Medication Side  Effects: none  Family Medical/ Social History: Changes? None  MENTAL HEALTH EXAM:  Last menstrual period 03/14/2023.There is no height or weight on file to calculate BMI.  General Appearance: Casual, Well Groomed, and Obese  Eye Contact:  Good  Speech:  Clear and Coherent and Normal Rate  Volume:  Normal  Mood:  Euthymic  Affect:  Congruent  Thought Process:  Goal Directed and Descriptions of Associations: Circumstantial  Orientation:  Full (Time, Place, and Person)  Thought Content: Logical   Suicidal Thoughts:  No  Homicidal Thoughts:  No  Memory:  WNL  Judgement:  Good  Insight:  Good  Psychomotor Activity:  Normal  Concentration:  Concentration: Good and Attention Span: Good  Recall:  Good  Fund of Knowledge: Good  Language: Good  Assets:  Communication Skills Desire for Improvement Financial Resources/Insurance Housing Resilience Transportation Vocational/Educational  ADL's:  Intact  Cognition: WNL  Prognosis:  Good   DIAGNOSES:    ICD-10-CM   1. Restless leg syndrome  G25.81     2. Recurrent major depressive disorder, in partial remission (HCC)  F33.41     3. Generalized anxiety disorder  F41.1     4. Crohn's disease with complication, unspecified gastrointestinal tract location [facility])  K50.919       Receiving Psychotherapy: No   RECOMMENDATIONS:  PDMP reviewed.  No controlled substances listed. I provided  25 minutes of face to face time during this encounter, including time spent before and after the visit in records review, medical decision making, counseling pertinent to today's visit, and charting.   She is responding well to the gabapentin so no changes will be made.  Continue Klonopin 0.5 mg, 1/2-1 tid prn. Continue gabapentin 600 mg, 1 at bedtime.  Continue Lamictal 200 mg, 1 p.o. twice daily. Continue  Zoloft 100 mg to 2.5 pills daily. Continue MVI, Vit D, B Complex. Return in 3 months.  Melony Overly, PA-C

## 2023-04-14 ENCOUNTER — Ambulatory Visit
Admission: RE | Admit: 2023-04-14 | Discharge: 2023-04-14 | Disposition: A | Discharge status: Home / Self Care | Payer: BC Managed Care – PPO | Source: Ambulatory Visit | Attending: Family Medicine | Admitting: Family Medicine

## 2023-04-14 DIAGNOSIS — M25511 Pain in right shoulder: Secondary | ICD-10-CM

## 2023-04-22 ENCOUNTER — Encounter: Payer: Self-pay | Admitting: Family Medicine

## 2023-04-29 NOTE — Progress Notes (Signed)
[name] [address] [place] [address] Phone: [phone] Subjective:   Bruce Donath, am serving as a scribe for Dr. Antoine Primas.  I'm seeing this patient by the request  of:  Farris Has, MD  CC: Back and neck pain  [id number]  Traci [name] is a 41 y.o. female coming in with complaint of back and neck pain. OMT 03/15/2023. Patient states that she would like to discuss MRI results for R shoulder. Pain is worse than last visit.   Medications patient has been prescribed: None  Taking:         Reviewed prior external information including notes and imaging from previsou exam, outside providers and external EMR if available.   As well as notes that were available from care everywhere and other healthcare systems.  Past medical history, social, surgical and family history all reviewed in electronic medical record.  No pertanent information unless stated regarding to the chief complaint.   Past Medical History:  Diagnosis Date   Anemia    Anxiety    Blood clot in vein    Right lower leg after surgery   Crohn's disease (HCC)    Depression    Dyspnea    Elevated BP without diagnosis of hypertension    Enlarged liver    GERD (gastroesophageal reflux disease)    Headache    IBS (irritable bowel syndrome)    Laceration of left little finger 02/28/2018   Lower extremity edema    Restless legs    Seasonal allergies    Uterine fibroid    Vitamin B 12 deficiency    Vitamin D deficiency     Allergies  Allergen Reactions   Wellbutrin [Bupropion] Anxiety   Adhesive [Tape]      Review of Systems:  No headache, visual changes, nausea, vomiting, diarrhea, constipation, dizziness, abdominal pain, skin rash, fevers, chills, night sweats, weight loss, swollen lymph nodes, body aches, joint swelling, chest pain, shortness of breath, mood changes. POSITIVE muscle aches  Objective  Blood pressure 122/88, pulse 93, height 5'  7" (1.702 m), weight 295 lb (133.8 kg), SpO2 99%.   General: No apparent distress alert and oriented x3 mood and affect normal, [name], [name], non tender, no erythema   Right shoulder exam does have positive O'Brien's noted.  Positive crossover and worsening pain with extension of the shoulder.  Procedure: Real-time Ultrasound Guided Injection of right glenohumeral joint Device: GE Logiq Q7  Ultrasound guided injection is preferred based studies that show increased duration, increased effect, greater accuracy, decreased procedural pain, increased response rate with ultrasound guided versus blind injection.  Verbal informed consent obtained.  Time-out conducted.  Noted no overlying erythema, induration, or other signs of local infection.  Skin prepped in a sterile fashion.  Local anesthesia: Topical Ethyl chloride.  With sterile technique and under real time ultrasound guidance:  Joint visualized.  23g 1  inch needle inserted posterior approach. Pictures taken for needle placement. Patient did have injection of 2 cc of 0.5% Marcaine, and 1.0 cc of Kenalog 40 mg/dL. Completed without difficulty  Pain immediately resolved suggesting accurate placement of the medication.  Advised to call if fevers/chills, erythema, induration, drainage, or persistent bleeding.  Impression: Technically successful ultrasound guided injection.     Assessment and Plan:  Glenoid labral tear, right, initial  encounter Patient given injection and tolerated the procedure well, discussed icing regimen and home exercises, discussed continuing with the range of motion exercises.  Follow-up with me again in 6 to 8 weeks otherwise.  Likely will consider the possibility of osteopathic manipulation afterwards.        The above  documentation has been reviewed and is accurate and complete Judi Saa, DO        Note: This dictation was prepared with Dragon dictation along with smaller phrase technology. Any transcriptional errors that result from this process are unintentional.

## 2023-05-03 ENCOUNTER — Encounter: Payer: Self-pay | Admitting: Family Medicine

## 2023-05-03 ENCOUNTER — Ambulatory Visit: Payer: BC Managed Care – PPO | Admitting: Family Medicine

## 2023-05-03 ENCOUNTER — Other Ambulatory Visit: Payer: Self-pay

## 2023-05-03 VITALS — BP 122/88 | HR 93 | Ht 67.0 in | Wt 295.0 lb

## 2023-05-03 DIAGNOSIS — S43431A Superior glenoid labrum lesion of right shoulder, initial encounter: Secondary | ICD-10-CM | POA: Diagnosis not present

## 2023-05-03 DIAGNOSIS — M25511 Pain in right shoulder: Secondary | ICD-10-CM

## 2023-05-03 NOTE — Assessment & Plan Note (Signed)
Patient given injection and tolerated the procedure well, discussed icing regimen and home exercises, discussed continuing with the range of motion exercises.  Follow-up with me again in 6 to 8 weeks otherwise.  Likely will consider the possibility of osteopathic manipulation afterwards.

## 2023-05-03 NOTE — Patient Instructions (Addendum)
Injected shoulder today See me again in 8 weeks 

## 2023-06-19 NOTE — Progress Notes (Signed)
[name] [address] [place] [address] Phone: [phone] Subjective:   Traci [name], am serving as a scribe for Dr. Antoine Primas.  I'm seeing this patient by the request  of:  Farris Has, MD  CC: Back and neck pain follow-up  [id number]  Traci [name] is a 43 y.o. female coming in with complaint of back and neck pain. OMT 05/03/2023. Also f/u for R shoulder pain. Steroid injection 05/03/2023.  Patient states shoulder pain coming back. Thinking about doing [name]. No new symptoms.  Medications patient has been prescribed: Zanaflex  Taking:         Reviewed prior external information including notes and imaging from previsou exam, outside providers and external EMR if available.   As well as notes that were available from care everywhere and other healthcare systems.  Past medical history, social, surgical and family history all reviewed in electronic medical record.  No pertanent information unless stated regarding to the chief complaint.   Past Medical History:  Diagnosis Date   Anemia    Anxiety    Blood clot in vein    Right lower leg after surgery   Crohn's disease (HCC)    Depression    Dyspnea    Elevated BP without diagnosis of hypertension    Enlarged liver    GERD (gastroesophageal reflux disease)    Headache    IBS (irritable bowel syndrome)    Laceration of left little finger 02/28/2018   Lower extremity edema    Restless legs    Seasonal allergies    Uterine fibroid    Vitamin B 12 deficiency    Vitamin D deficiency     Allergies  Allergen Reactions   Wellbutrin [Bupropion] Anxiety   Adhesive [Tape]      Review of Systems:  No headache, visual changes, nausea, vomiting, diarrhea, constipation, dizziness, abdominal pain, skin rash, fevers, chills, night sweats, weight loss, swollen lymph nodes, body aches, joint swelling, chest pain, shortness of breath, mood changes. POSITIVE muscle  aches  Objective  Blood pressure 126/82, height 5\' 7"  (1.702 m), weight 299 lb (135.6 kg).   General: No apparent distress alert and oriented x3 mood and affect normal, [name], [name], non tender, no erythema  Gait MSK:  Back does have some loss of lordosis.  Does have significant tightness in the right shoulder region.  Parascapular tightness noted.  Some limited sidebending of the neck noted.  Negative straight leg test.  Osteopathic findings  C2 flexed rotated and side bent right C6 flexed rotated and side bent left T3 extended rotated and side bent right inhaled rib T7 extended rotated and side bent right L2 flexed rotated and side bent right L5 flexed rotated and side bent left Sacrum right on right       Assessment and Plan:  Scapular dyskinesis Stability and instability noted.  Discussed with patient that the shoulder likely does have a labral pathology and we could consider potentially [name].  We discussed this at great length.  Patient would like to consider it.  Discussed icing regimen and home exercises.  Discussed which activities to do and which ones to avoid.  Increase activity slowly otherwise.  Total time with patient today 31 minutes    Nonallopathic problems  Decision today to treat with OMT was based on  Physical Exam  After verbal consent patient was treated with HVLA, ME, [name], rib, thoracic, lumbar, [name], stretches and lifestyle modifications  See medications in patient instructions if given  Patient will follow up in 4-8 weeks     The above documentation has been reviewed and is accurate and complete Judi Saa, DO         Note:  This dictation was prepared with Dragon dictation along with smaller phrase technology. Any transcriptional errors that result from this process are unintentional.

## 2023-06-26 ENCOUNTER — Ambulatory Visit (INDEPENDENT_AMBULATORY_CARE_PROVIDER_SITE_OTHER): Payer: 59 | Admitting: Family Medicine

## 2023-06-26 ENCOUNTER — Encounter: Payer: Self-pay | Admitting: Family Medicine

## 2023-06-26 ENCOUNTER — Other Ambulatory Visit: Payer: Self-pay

## 2023-06-26 VITALS — BP 126/82 | Ht 67.0 in | Wt 299.0 lb

## 2023-06-26 DIAGNOSIS — M9902 Segmental and somatic dysfunction of thoracic region: Secondary | ICD-10-CM

## 2023-06-26 DIAGNOSIS — M9901 Segmental and somatic dysfunction of cervical region: Secondary | ICD-10-CM | POA: Diagnosis not present

## 2023-06-26 DIAGNOSIS — M9908 Segmental and somatic dysfunction of rib cage: Secondary | ICD-10-CM | POA: Diagnosis not present

## 2023-06-26 DIAGNOSIS — M9903 Segmental and somatic dysfunction of lumbar region: Secondary | ICD-10-CM

## 2023-06-26 DIAGNOSIS — M25511 Pain in right shoulder: Secondary | ICD-10-CM

## 2023-06-26 DIAGNOSIS — G2589 Other specified extrapyramidal and movement disorders: Secondary | ICD-10-CM

## 2023-06-26 DIAGNOSIS — M9904 Segmental and somatic dysfunction of sacral region: Secondary | ICD-10-CM

## 2023-06-26 NOTE — Assessment & Plan Note (Signed)
Stability and instability noted.  Discussed with patient that the shoulder likely does have a labral pathology and we could consider potentially [name].  We discussed this at great length.  Patient would like to consider it.  Discussed icing regimen and home exercises.  Discussed which activities to do and which ones to avoid.  Increase activity slowly otherwise.  Total time with patient today 31 minutes

## 2023-07-01 ENCOUNTER — Other Ambulatory Visit: Payer: Self-pay | Admitting: Physician Assistant

## 2023-07-10 ENCOUNTER — Ambulatory Visit: Payer: BC Managed Care – PPO | Admitting: Physician Assistant

## 2023-07-15 ENCOUNTER — Encounter: Payer: Self-pay | Admitting: Physician Assistant

## 2023-07-15 ENCOUNTER — Ambulatory Visit: Payer: BC Managed Care – PPO | Admitting: Physician Assistant

## 2023-07-15 DIAGNOSIS — G2581 Restless legs syndrome: Secondary | ICD-10-CM | POA: Diagnosis not present

## 2023-07-15 DIAGNOSIS — K50919 Crohn's disease, unspecified, with unspecified complications: Secondary | ICD-10-CM

## 2023-07-15 DIAGNOSIS — F4321 Adjustment disorder with depressed mood: Secondary | ICD-10-CM

## 2023-07-15 DIAGNOSIS — F411 Generalized anxiety disorder: Secondary | ICD-10-CM

## 2023-07-15 DIAGNOSIS — F3341 Major depressive disorder, recurrent, in partial remission: Secondary | ICD-10-CM

## 2023-07-15 MED ORDER — SERTRALINE HCL 100 MG PO TABS: 250.0000 mg | ORAL_TABLET | Freq: Every day | ORAL | 3 refills | Status: AC

## 2023-07-15 MED ORDER — LAMOTRIGINE 200 MG PO TABS: 200.0000 mg | ORAL_TABLET | Freq: Two times a day (BID) | ORAL | Status: DC

## 2023-07-15 NOTE — Progress Notes (Signed)
 [facility]  Patient ID: Traci [name],  MRN: [id number]  PCP: Farris Has, MD  Date of Evaluation: 07/15/2023 time spent:20 minutes  Chief Complaint:  Chief Complaint   Anxiety; Depression; Follow-up    HISTORY/CURRENT STATUS: HPI For routine med check.  Accompanied by her husband Viviann Spare.  Overall she is doing well.  Has been under continued stress due to family issues, her mother-in-law is getting married next month.  Her father-in-law died 2 years ago, he was a great person and it's been hard seeing how her MIL treats her husband and her brother-in-law.  Traci [name] still needs the Klonopin which is helpful.  She does not take it unless absolutely necessary.  She sleeps well most of the time.  The restless leg syndrome has responded much better to gabapentin.  Since her last visit 3 months ago they had to put their 2 cats to sleep.  That has been painful.  Patient is able to enjoy things.  Energy and motivation are good.  Work is going well.   No extreme sadness, tearfulness, or feelings of hopelessness.  ADLs and personal hygiene are normal.   Denies any changes in concentration, making decisions, or remembering things.  Appetite has not changed.  Weight is stable.   Denies suicidal or homicidal thoughts.  Patient denies increased energy with decreased need for sleep, increased talkativeness, racing thoughts, impulsivity or risky behaviors, increased spending, increased libido, grandiosity, increased irritability or anger, paranoia, or hallucinations.  Denies dizziness, syncope, seizures, numbness, tingling, tremor, tics, unsteady gait, slurred speech, confusion. Denies muscle or joint pain, stiffness, or dystonia. Denies unexplained weight loss, frequent infections, or sores that heal slowly.  No polyphagia, polydipsia, or polyuria. Denies visual changes or paresthesias.   Individual Medical History/ Review of Systems: Changes? :No      Past medications for mental health  diagnoses include: Zoloft, Xanax, Wellbutrin, Lexapro, Effexor, Luvox, Cymbalta caused more anxiety then depression, Ropinerole, Gabapentin worked but quit working after [employer]  Allergies: Wellbutrin [bupropion] and Adhesive [tape]  Current Medications:  Current Outpatient Medications:    acetaminophen (TYLENOL) 500 MG tablet, Take 1,000 mg by mouth 2 (two) times daily as needed. Rapid release, Disp: , Rfl:    azaTHIOprine (IMURAN) 50 MG tablet, Take 125 mg by mouth daily., Disp: , Rfl:    Cholecalciferol 25 MCG (1000 UT) tablet, Take by mouth., Disp: , Rfl:    clonazePAM (KLONOPIN) 0.5 MG tablet, TAKE 1/2 TO 1 TABLET BY MOUTH THREE TIMES DAILY AS NEEDED, Disp: 270 tablet, Rfl: 1   cyanocobalamin (,VITAMIN B-12,) 1000 MCG/ML injection, Inject 1,000 mcg into the muscle every 30 (thirty) days., Disp: , Rfl:    ferrous sulfate 325 (65 FE) MG tablet, Take by mouth., Disp: , Rfl:    fluticasone (FLONASE) 50 MCG/ACT nasal spray, Place into the nose., Disp: , Rfl:    folic acid (FOLVITE) 1 MG tablet, Take 1 mg by mouth daily., Disp: , Rfl:    gabapentin (NEURONTIN) 600 MG tablet, Take 1 tablet (600 mg total) by mouth at bedtime., Disp: 90 tablet, Rfl: 1   inFLIXimab (REMICADE IV), Inject into the vein., Disp: , Rfl:    inFLIXimab-axxq (AVSOLA IV), Inject into the vein. Q 7 weeks, Disp: , Rfl:    loratadine (CLARITIN) 10 MG tablet, Take 10 mg by mouth daily., Disp: , Rfl:    Melatonin 10 MG TABS, Take by mouth., Disp: , Rfl:    montelukast (SINGULAIR) 10 MG tablet, TAKE 1 TABLET(10 MG) BY MOUTH  DAILY, Disp: , Rfl:    omeprazole (PRILOSEC) 40 MG capsule, Take 40 mg by mouth daily., Disp: , Rfl:    tiZANidine (ZANAFLEX) 4 MG tablet, TAKE 1 TABLET(4 MG) BY MOUTH AT BEDTIME, Disp: 30 tablet, Rfl: 0   Vitamin D, Ergocalciferol, (DRISDOL) 1.25 MG (50000 UNIT) CAPS capsule, Take 1 capsule (50,000 Units total) by mouth every 7 (seven) days., Disp: 12 capsule, Rfl: 0   lamoTRIgine (LAMICTAL) 200 MG tablet,  Take 1 tablet (200 mg total) by mouth 2 (two) times daily., Disp: , Rfl:    sertraline (ZOLOFT) 100 MG tablet, Take 2.5 tablets (250 mg total) by mouth daily., Disp: 225 tablet, Rfl: 3 Medication Side Effects: none  Family Medical/ Social History: Changes? None  MENTAL HEALTH EXAM:  There were no vitals taken for this visit.There is no height or weight on file to calculate BMI.  General Appearance: Casual, Well Groomed, and Obese  Eye Contact:  Good  Speech:  Clear and Coherent and Normal Rate  Volume:  Normal  Mood:  Euthymic  Affect:  Congruent  Thought Process:  Goal Directed and Descriptions of Associations: Circumstantial  Orientation:  Full (Time, Place, and Person)  Thought Content: Logical   Suicidal Thoughts:  No  Homicidal Thoughts:  No  Memory:  WNL  Judgement:  Good  Insight:  Good  Psychomotor Activity:  Normal  Concentration:  Concentration: Good and Attention Span: Good  Recall:  Good  Fund of Knowledge: Good  Language: Good  Assets:  Communication Skills Desire for Improvement Financial Resources/Insurance Housing Resilience Social Support Transportation Vocational/Educational  ADL's:  Intact  Cognition: WNL  Prognosis:  Good   DIAGNOSES:    ICD-10-CM   1. Recurrent major depressive disorder, in partial remission (HCC)  F33.41     2. Generalized anxiety disorder  F41.1     3. Restless leg syndrome  G25.81     4. Crohn's disease with complication, unspecified gastrointestinal tract location (HCC)  K50.919     5. Grief  F43.21      Receiving Psychotherapy: No   RECOMMENDATIONS:  PDMP reviewed.  No controlled substances listed. I provided 20 minutes of face to face time during this encounter, including time spent before and after the visit in records review, medical decision making, counseling pertinent to today's visit, and charting.   As far as her medications go, Traci [name] is doing well.  No changes will be made.  Continue Klonopin 0.5 mg, 1/2-1  tid prn. Continue gabapentin 600 mg, 1 at bedtime.  Continue Lamictal 200 mg, 1 p.o. twice daily. Continue  Zoloft 100 mg to 2.5 pills daily. Continue MVI, Vit D, B Complex. Return in 3 months.  Melony Overly, PA-C

## 2023-08-06 ENCOUNTER — Other Ambulatory Visit: Payer: Self-pay | Admitting: Physician Assistant

## 2023-08-08 ENCOUNTER — Other Ambulatory Visit: Payer: Self-pay | Admitting: Physician Assistant

## 2023-08-08 MED ORDER — LAMOTRIGINE 200 MG PO TABS: 200.0000 mg | ORAL_TABLET | Freq: Two times a day (BID) | ORAL | 3 refills | Status: AC

## 2023-09-03 NOTE — Progress Notes (Unsigned)
 [facility] [address] [place] [address] Phone: [phone] Subjective:   IBryan Caprio, am serving as a scribe for Dr. Ronnell Coins.  I'm seeing this patient by the request  of:  Ronna Coho, MD  CC: MRI shoulder pain follow-up  [id number]  Traci [name] is a 43 y.o. female coming in with complaint of R shoulder pain. Here for [name] today. Patient states shoulder pain. Continues to have some discomfort and pain noted.      Past Medical History:  Diagnosis Date   Anemia    Anxiety    Blood clot in vein    Right lower leg after surgery   Crohn's disease (HCC)    Depression    Dyspnea    Elevated BP without diagnosis of hypertension    Enlarged liver    GERD (gastroesophageal reflux disease)    Headache    IBS (irritable bowel syndrome)    Laceration of left little finger 02/28/2018   Lower extremity edema    Restless legs    Seasonal allergies    Uterine fibroid    Vitamin B 12 deficiency    Vitamin D  deficiency    Past Surgical History:  Procedure Laterality Date   BOWEL RESECTION     BREAST BIOPSY     EXTRACORPOREAL SHOCK WAVE LITHOTRIPSY Right 12/15/2020   Procedure: EXTRACORPOREAL SHOCK WAVE LITHOTRIPSY (ESWL);  Surgeon: Samson Croak, MD;  Location: [facility];  Service: Urology;  Laterality: Right;   NASAL SINUS SURGERY      Family History  Problem Relation Age of Onset   Heart failure Other    Leukemia Paternal Grandmother    Hyperlipidemia Mother    Thyroid disease Mother    Anxiety disorder Mother    Depression Mother    Heart attack Father       Current Outpatient Medications (Respiratory):    fluticasone (FLONASE) 50 MCG/ACT nasal spray, Place into the nose.   loratadine (CLARITIN) 10 MG tablet, Take 10 mg by mouth daily.   montelukast (SINGULAIR) 10 MG tablet, TAKE 1 TABLET(10 MG) BY MOUTH DAILY  Current Outpatient Medications (Analgesics):    acetaminophen  (TYLENOL ) 500  MG tablet, Take 1,000 mg by mouth 2 (two) times daily as needed. Rapid release  Current Outpatient Medications (Hematological):    cyanocobalamin (,VITAMIN B-12,) 1000 MCG/ML injection, Inject 1,000 mcg into the muscle every 30 (thirty) days.   ferrous sulfate 325 (65 FE) MG tablet, Take by mouth.   folic acid (FOLVITE) 1 MG tablet, Take 1 mg by mouth daily.  Current Outpatient Medications (Other):    azaTHIOprine (IMURAN) 50 MG tablet, Take 125 mg by mouth daily.   Cholecalciferol 25 MCG (1000 UT) tablet, Take by mouth.   clonazePAM  (KLONOPIN ) 0.5 MG tablet, TAKE 1/2 TO 1 TABLET BY MOUTH THREE TIMES DAILY AS NEEDED   gabapentin  (NEURONTIN ) 600 MG tablet, Take 1 tablet (600 mg total) by mouth at bedtime.   inFLIXimab (REMICADE IV), Inject into the vein.   inFLIXimab-axxq (AVSOLA IV), Inject into the vein. Q 7 weeks   lamoTRIgine  (LAMICTAL ) 200 MG tablet, Take 1 tablet (200 mg total) by mouth 2 (two) times daily.   Melatonin 10 MG TABS, Take by mouth.   omeprazole (PRILOSEC) 40 MG capsule, Take 40 mg by mouth daily.   sertraline  (ZOLOFT ) 100 MG tablet, Take 2.5 tablets (250 mg total) by mouth daily.   tiZANidine  (ZANAFLEX ) 4 MG tablet, TAKE 1 TABLET(4 MG)  BY MOUTH AT BEDTIME   Vitamin D , Ergocalciferol , (DRISDOL ) 1.25 MG (50000 UNIT) CAPS capsule, Take 1 capsule (50,000 Units total) by mouth every 7 (seven) days.     Objective  Blood pressure 112/70, pulse 95, height 5\' 7"  (1.702 m), weight 274 lb (124.3 kg), SpO2 96%.   General: No apparent distress alert and oriented x3 mood and   Procedure: Real-time Ultrasound Guided Injection of right glenohumeral joint Device: GE Logiq Q7  Ultrasound guided injection is preferred based studies that show increased duration, increased effect, greater accuracy, decreased procedural pain, increased response rate with ultrasound guided versus blind injection.  Verbal informed consent obtained.  Time-out conducted.  Noted no overlying erythema,  induration, or other signs of local infection.  Skin prepped in a sterile fashion.  Local anesthesia: Topical Ethyl chloride.  With sterile technique and under real time ultrasound guidance:  Joint visualized.  23g 1  inch needle inserted posterior approach. Pictures taken for needle placement. Patient did have injection of 1 cc of 0.5% Marcaine and then injecting 4 cc of [name]. Completed without difficulty  Pain immediately resolved suggesting accurate placement of the medication.  Advised to call if fevers/chills, erythema, induration, drainage, or persistent bleeding.  Images sent Impression: Technically successful ultrasound guided injection.    Impression and Recommendations:     The above documentation has been reviewed and is accurate and complete Esraa Seres M Lilymae Swiech, DO

## 2023-09-04 ENCOUNTER — Other Ambulatory Visit: Payer: Self-pay

## 2023-09-04 ENCOUNTER — Ambulatory Visit: Payer: Self-pay | Admitting: Family Medicine

## 2023-09-04 ENCOUNTER — Encounter: Payer: Self-pay | Admitting: Family Medicine

## 2023-09-04 VITALS — BP 112/70 | HR 95 | Ht 67.0 in | Wt 274.0 lb

## 2023-09-04 DIAGNOSIS — M25511 Pain in right shoulder: Secondary | ICD-10-CM

## 2023-09-04 DIAGNOSIS — S43431A Superior glenoid labrum lesion of right shoulder, initial encounter: Secondary | ICD-10-CM

## 2023-09-04 NOTE — Patient Instructions (Signed)
No ice or ibuprofen for 3 days Tylenol and heat are ok Follow exercises as instructed Follow up in 6 weeks  

## 2023-09-04 NOTE — Assessment & Plan Note (Signed)
 [name] given, post [name] instructions given today.  Follow-up again in 6 to 8 weeks.

## 2023-09-08 ENCOUNTER — Other Ambulatory Visit: Payer: Self-pay | Admitting: Physician Assistant

## 2023-09-08 MED ORDER — CLONAZEPAM 0.5 MG PO TABS: ORAL_TABLET | ORAL | 1 refills | Status: DC

## 2023-10-07 ENCOUNTER — Encounter: Payer: Self-pay | Admitting: Physician Assistant

## 2023-10-07 ENCOUNTER — Ambulatory Visit (INDEPENDENT_AMBULATORY_CARE_PROVIDER_SITE_OTHER): Admitting: Physician Assistant

## 2023-10-07 DIAGNOSIS — F411 Generalized anxiety disorder: Secondary | ICD-10-CM | POA: Diagnosis not present

## 2023-10-07 DIAGNOSIS — Z638 Other specified problems related to primary support group: Secondary | ICD-10-CM

## 2023-10-07 DIAGNOSIS — F3341 Major depressive disorder, recurrent, in partial remission: Secondary | ICD-10-CM | POA: Diagnosis not present

## 2023-10-07 DIAGNOSIS — G2581 Restless legs syndrome: Secondary | ICD-10-CM | POA: Diagnosis not present

## 2023-10-07 MED ORDER — GABAPENTIN 600 MG PO TABS: 600.0000 mg | ORAL_TABLET | Freq: Every day | ORAL | 1 refills | Status: DC

## 2023-10-07 NOTE — Progress Notes (Unsigned)
 [facility]  Patient ID: Traci [name],  MRN: [id number]  PCP: Traci Coho, MD  Date of Evaluation: 10/07/2023 time spent:20 minutes  Chief Complaint:  Chief Complaint   Depression; Anxiety; Follow-up    HISTORY/CURRENT STATUS: HPI For routine med check.  Accompanied by her husband Traci [name].  Overall Traci [name] is doing well.  She and her husband took a cruise to [place]  a few weeks ago which she enjoyed.   Energy and motivation are good most of the time.  Work is going well.   No extreme sadness, tearfulness, or feelings of hopelessness.  She usually sleeps well.. ADLs and personal hygiene are normal.   Denies any changes in concentration, making decisions, or remembering things.  Appetite has not changed.  Weight is stable.  She continues to have anxiety, more so of being overwhelmed, not having panic attacks.  The Klonopin  is still effective.  Denies suicidal or homicidal thoughts.  There has been continued stress with her mother-in-law who remarried a couple of months ago.  But she and her husband are dealing with that as best they can.  Patient denies increased energy with decreased need for sleep, increased talkativeness, racing thoughts, impulsivity or risky behaviors, increased spending, increased libido, grandiosity, increased irritability or anger, paranoia, or hallucinations.  Denies dizziness, syncope, seizures, numbness, tingling, tremor, tics, unsteady gait, slurred speech, confusion. Denies muscle or joint pain, stiffness, or dystonia.  Individual Medical History/ Review of Systems: Changes? :Yes     she continues Remicade as directed.  Past medications for mental health diagnoses include: Zoloft , Xanax, Wellbutrin, Lexapro, Effexor, Luvox, Cymbalta  caused more anxiety then depression, Ropinerole, Gabapentin  worked but quit working after [employer]  Allergies: Wellbutrin [bupropion] and Adhesive [tape]  Current Medications:  Current Outpatient Medications:     acetaminophen  (TYLENOL ) 500 MG tablet, Take 1,000 mg by mouth 2 (two) times daily as needed. Rapid release, Disp: , Rfl:    azaTHIOprine (IMURAN) 50 MG tablet, Take 125 mg by mouth daily., Disp: , Rfl:    Cholecalciferol 25 MCG (1000 UT) tablet, Take by mouth., Disp: , Rfl:    clonazePAM  (KLONOPIN ) 0.5 MG tablet, TAKE 1/2 TO 1 TABLET BY MOUTH THREE TIMES DAILY AS NEEDED, Disp: 270 tablet, Rfl: 1   cyanocobalamin (,VITAMIN B-12,) 1000 MCG/ML injection, Inject 1,000 mcg into the muscle every 30 (thirty) days., Disp: , Rfl:    ferrous sulfate 325 (65 FE) MG tablet, Take by mouth., Disp: , Rfl:    fluticasone (FLONASE) 50 MCG/ACT nasal spray, Place into the nose., Disp: , Rfl:    folic acid (FOLVITE) 1 MG tablet, Take 1 mg by mouth daily., Disp: , Rfl:    inFLIXimab (REMICADE IV), Inject into the vein., Disp: , Rfl:    inFLIXimab-axxq (AVSOLA IV), Inject into the vein. Q 7 weeks, Disp: , Rfl:    lamoTRIgine  (LAMICTAL ) 200 MG tablet, Take 1 tablet (200 mg total) by mouth 2 (two) times daily., Disp: 180 tablet, Rfl: 3   loratadine (CLARITIN) 10 MG tablet, Take 10 mg by mouth daily., Disp: , Rfl:    Melatonin 10 MG TABS, Take by mouth., Disp: , Rfl:    montelukast (SINGULAIR) 10 MG tablet, TAKE 1 TABLET(10 MG) BY MOUTH DAILY, Disp: , Rfl:    omeprazole (PRILOSEC) 40 MG capsule, Take 40 mg by mouth daily., Disp: , Rfl:    sertraline  (ZOLOFT ) 100 MG tablet, Take 2.5 tablets (250 mg total) by mouth daily., Disp: 225 tablet, Rfl: 3   tiZANidine  (ZANAFLEX ) 4 MG  tablet, TAKE 1 TABLET(4 MG) BY MOUTH AT BEDTIME, Disp: 30 tablet, Rfl: 0   Vitamin D , Ergocalciferol , (DRISDOL ) 1.25 MG (50000 UNIT) CAPS capsule, Take 1 capsule (50,000 Units total) by mouth every 7 (seven) days., Disp: 12 capsule, Rfl: 0   gabapentin  (NEURONTIN ) 600 MG tablet, Take 1 tablet (600 mg total) by mouth at bedtime., Disp: 90 tablet, Rfl: 1 Medication Side Effects: none  Family Medical/ Social History: Changes? MIL remarried.  Her husband  took a another job, with a pay cut but they are doing okay financially and he certainly has less stress.  MENTAL HEALTH EXAM:  There were no vitals taken for this visit.There is no height or weight on file to calculate BMI.  General Appearance: Casual, Well Groomed, and Obese  Eye Contact:  Good  Speech:  Clear and Coherent and Normal Rate  Volume:  Normal  Mood:  Euthymic  Affect:  Congruent  Thought Process:  Goal Directed and Descriptions of Associations: Circumstantial  Orientation:  Full (Time, Place, and Person)  Thought Content: Logical   Suicidal Thoughts:  No  Homicidal Thoughts:  No  Memory:  WNL  Judgement:  Good  Insight:  Good  Psychomotor Activity:  Normal  Concentration:  Concentration: Good and Attention Span: Good  Recall:  Good  Fund of Knowledge: Good  Language: Good  Assets:  Communication Skills Desire for Improvement Financial Resources/Insurance Housing Leisure Time Resilience Social Support Transportation Vocational/Educational  ADL's:  Intact  Cognition: WNL  Prognosis:  Good   DIAGNOSES:    ICD-10-CM   1. Recurrent major depressive disorder, in partial remission (HCC)  F33.41     2. Generalized anxiety disorder  F41.1     3. Restless leg syndrome  G25.81     4. Stress due to family tension  Z63.8       Receiving Psychotherapy: No   RECOMMENDATIONS:  PDMP reviewed.  No controlled substances listed. I provided 20 minutes of face to face time during this encounter, including time spent before and after the visit in records review, medical decision making, counseling pertinent to today's visit, and charting.   She is doing well with her mental health medications so no changes are needed.  Continue Klonopin  0.5 mg, 1/2-1 tid prn. Continue gabapentin  600 mg, 1 at bedtime.  Continue Lamictal  200 mg, 1 p.o. twice daily. Continue  Zoloft  100 mg to 2.5 pills daily. Continue MVI, Vit D, B Complex. Return in 3 months.  Traci Slocumb,  PA-C

## 2023-10-16 NOTE — Progress Notes (Signed)
 [facility] [address] [place] [address] Phone: [phone] Subjective:   IBryan Caprio, am serving as a scribe for Dr. Ronnell Coins.  I'm seeing this patient by the request  of:  Ronna Coho, MD  CC: shoulder pain follow up   [id number]  09/04/2023 [name]   Update 10/17/2023 Traci [name] is a 43 y.o. female coming in with complaint of R shoulder pain. Patient states shoulder is still hurting. Hasn't been consistent with exercises.     Past Medical History:  Diagnosis Date   Anemia    Anxiety    Blood clot in vein    Right lower leg after surgery   Crohn's disease (HCC)    Depression    Dyspnea    Elevated BP without diagnosis of hypertension    Enlarged liver    GERD (gastroesophageal reflux disease)    Headache    IBS (irritable bowel syndrome)    Laceration of left little finger 02/28/2018   Lower extremity edema    Restless legs    Seasonal allergies    Uterine fibroid    Vitamin B 12 deficiency    Vitamin D  deficiency    Past Surgical History:  Procedure Laterality Date   BOWEL RESECTION     BREAST BIOPSY     EXTRACORPOREAL SHOCK WAVE LITHOTRIPSY Right 12/15/2020   Procedure: EXTRACORPOREAL SHOCK WAVE LITHOTRIPSY (ESWL);  Surgeon: Samson Croak, MD;  Location: [facility];  Service: Urology;  Laterality: Right;   NASAL SINUS SURGERY     Social History   Socioeconomic History   Marital status: Married    Spouse name: Catrina Fellenz   Number of children: Not on file   Years of education: Not on file   Highest education level: Not on file  Occupational History   Occupation: Xray Tech   Tobacco Use   Smoking status: Never   Smokeless tobacco: Never  Substance and Sexual Activity   Alcohol use: Never   Drug use: Never   Sexual activity: Not on file  Other Topics Concern   Not on file  Social History Narrative   Lives with husband   Right Handed   Drinks 2-3 cups caffeine   Social  Drivers of Health   Financial Resource Strain: Low Risk  (10/24/2022)   Received from [facility]   Overall Financial Resource Strain (CARDIA)    Difficulty of Paying Living Expenses: Not hard at all  Food Insecurity: No Food Insecurity (10/24/2022)   Received from [facility]   Hunger Vital Sign    Worried About Running Out of Food in the Last Year: Never true    Ran Out of Food in the Last Year: Never true  Transportation Needs: No Transportation Needs (10/24/2022)   Received from [facility] - Transportation    Lack of Transportation (Medical): No    Lack of Transportation (Non-Medical): No  Physical Activity: Unknown (10/24/2022)   Received from [facility]   Exercise Vital Sign    Days of Exercise per Week: Patient declined    Minutes of Exercise per Session: Not on file  Stress: No Stress Concern Present (10/24/2022)   Received from [facility] - Occupational Stress Questionnaire    Feeling of Stress : Not at all  Social Connections: Moderately Integrated (10/24/2022)   Received from [facility]   Social Network    How would you rate  your social network (family, work, friends)?: Adequate participation with social networks   Allergies  Allergen Reactions   Wellbutrin [Bupropion] Anxiety   Adhesive [Tape]    Family History  Problem Relation Age of Onset   Heart failure Other    Leukemia Paternal Grandmother    Hyperlipidemia Mother    Thyroid disease Mother    Anxiety disorder Mother    Depression Mother    Heart attack Father       Current Outpatient Medications (Respiratory):    fluticasone (FLONASE) 50 MCG/ACT nasal spray, Place into the nose.   loratadine (CLARITIN) 10 MG tablet, Take 10 mg by mouth daily.   montelukast (SINGULAIR) 10 MG tablet, TAKE 1 TABLET(10 MG) BY MOUTH DAILY  Current Outpatient Medications (Analgesics):    acetaminophen  (TYLENOL ) 500 MG tablet, Take 1,000 mg by mouth 2 (two)  times daily as needed. Rapid release  Current Outpatient Medications (Hematological):    cyanocobalamin (,VITAMIN B-12,) 1000 MCG/ML injection, Inject 1,000 mcg into the muscle every 30 (thirty) days.   ferrous sulfate 325 (65 FE) MG tablet, Take by mouth.   folic acid (FOLVITE) 1 MG tablet, Take 1 mg by mouth daily.  Current Outpatient Medications (Other):    azaTHIOprine (IMURAN) 50 MG tablet, Take 125 mg by mouth daily.   Cholecalciferol 25 MCG (1000 UT) tablet, Take by mouth.   clonazePAM  (KLONOPIN ) 0.5 MG tablet, TAKE 1/2 TO 1 TABLET BY MOUTH THREE TIMES DAILY AS NEEDED   gabapentin  (NEURONTIN ) 600 MG tablet, Take 1 tablet (600 mg total) by mouth at bedtime.   inFLIXimab (REMICADE IV), Inject into the vein.   inFLIXimab-axxq (AVSOLA IV), Inject into the vein. Q 7 weeks   lamoTRIgine  (LAMICTAL ) 200 MG tablet, Take 1 tablet (200 mg total) by mouth 2 (two) times daily.   Melatonin 10 MG TABS, Take by mouth.   omeprazole (PRILOSEC) 40 MG capsule, Take 40 mg by mouth daily.   sertraline  (ZOLOFT ) 100 MG tablet, Take 2.5 tablets (250 mg total) by mouth daily.   tiZANidine  (ZANAFLEX ) 4 MG tablet, TAKE 1 TABLET(4 MG) BY MOUTH AT BEDTIME   Vitamin D , Ergocalciferol , (DRISDOL ) 1.25 MG (50000 UNIT) CAPS capsule, Take 1 capsule (50,000 Units total) by mouth every 7 (seven) days.   Reviewed prior external information including notes and imaging from  primary care provider As well as notes that were available from care everywhere and other healthcare systems.  Past medical history, social, surgical and family history all reviewed in electronic medical record.  No pertanent information unless stated regarding to the chief complaint.   Review of Systems:  No headache, visual changes, nausea, vomiting, diarrhea, constipation, dizziness, abdominal pain, skin rash, fevers, chills, night sweats, weight loss, swollen lymph nodes, body aches, joint swelling, chest pain, shortness of breath, mood changes.  POSITIVE muscle aches  Objective  Blood pressure 108/70, pulse 98, height 5' 7 (1.702 m), weight 266 lb (120.7 kg), SpO2 97%.   General: No apparent distress alert and oriented x3 mood and affect normal, [name], [name], non tender, no erythema  Shoulder exam shows improvement in range of motion noted.  Still positive O'Brien's negative.  Patient's rotator cuff strength seems to be intact with 5 out of 5.  Tightness in the parascapular area.  Seems to be right greater than left.  Neck exam does have some  limited sidebending noted right greater than left.  Limited muscular skeletal ultrasound was performed and interpreted by Ronnell Coins, M  Limited ultrasound shows the patient still has some very mild hypoechoic changes of the posterior labrum but does seem to be improved.  Some calcific changes noted that could be potentially some scar tissue formation over the posterior aspect.   Osteopathic findings C2 flexed rotated and side bent right C5 flexed rotated and side bent left C6 flexed rotated and side bent right T3 extended rotated and side bent right inhaled third rib T9 extended rotated and side bent right L2 flexed rotated and side bent right Sacrum right on right    Impression and Recommendations:    Glenoid labral tear, right, initial encounter Patient does show some interval improvement noted.  Has not been very complacent with the exercises.  Encourage patient to try this regular basis and see how she responds.  We can always go back to steroid injections if needed.  Discussed icing regimen and home exercises.  Follow-up with me again in 6 to 8 weeks as well.     Decision today to treat with OMT was based on Physical Exam  After verbal consent patient was treated with HVLA, ME, FPR  techniques in cervical, thoracic, rib, lumbar and sacral areas, all areas are chronic   Patient tolerated the procedure well with improvement in symptoms  Patient given exercises, stretches and lifestyle modifications  See medications in patient instructions if given  Patient will follow up in 4-8 weeks  The above documentation has been reviewed and is accurate and complete Tex Conroy M Lilliona Blakeney, DO

## 2023-10-17 ENCOUNTER — Other Ambulatory Visit: Payer: Self-pay

## 2023-10-17 ENCOUNTER — Ambulatory Visit: Admitting: Family Medicine

## 2023-10-17 ENCOUNTER — Encounter: Payer: Self-pay | Admitting: Family Medicine

## 2023-10-17 VITALS — BP 108/70 | HR 98 | Ht 67.0 in | Wt 266.0 lb

## 2023-10-17 DIAGNOSIS — M9901 Segmental and somatic dysfunction of cervical region: Secondary | ICD-10-CM

## 2023-10-17 DIAGNOSIS — G2589 Other specified extrapyramidal and movement disorders: Secondary | ICD-10-CM

## 2023-10-17 DIAGNOSIS — M9903 Segmental and somatic dysfunction of lumbar region: Secondary | ICD-10-CM

## 2023-10-17 DIAGNOSIS — S43431A Superior glenoid labrum lesion of right shoulder, initial encounter: Secondary | ICD-10-CM

## 2023-10-17 DIAGNOSIS — M9904 Segmental and somatic dysfunction of sacral region: Secondary | ICD-10-CM

## 2023-10-17 DIAGNOSIS — M25511 Pain in right shoulder: Secondary | ICD-10-CM | POA: Diagnosis not present

## 2023-10-17 DIAGNOSIS — M9902 Segmental and somatic dysfunction of thoracic region: Secondary | ICD-10-CM

## 2023-10-17 DIAGNOSIS — M9908 Segmental and somatic dysfunction of rib cage: Secondary | ICD-10-CM

## 2023-10-17 NOTE — Assessment & Plan Note (Signed)
 Continue to work on Psychologist, forensic, responds well to osteopathic manipulation.  Did not do well today.  Hopefully patient will start doing the exercises more religiously and will make some improvement.

## 2023-10-17 NOTE — Assessment & Plan Note (Signed)
 Patient does show some interval improvement noted.  Has not been very complacent with the exercises.  Encourage patient to try this regular basis and see how she responds.  We can always go back to steroid injections if needed.  Discussed icing regimen and home exercises.  Follow-up with me again in 6 to 8 weeks as well.

## 2023-10-17 NOTE — Patient Instructions (Signed)
 Good to see you! Stay active Try to do exercises regularly  See you again in 6-8 weeks

## 2023-11-27 NOTE — Progress Notes (Unsigned)
 [name] [address] [place] [address] Phone: [phone] Subjective:   ISusannah [name], am serving as a scribe for Dr. Arthea [name].  I'm seeing this patient by the request  of:  Traci Righter, MD  CC: Right shoulder pain and back pain follow-up  YEP:Dlagzrupcz  Traci [name] is a 43 y.o. female coming in with complaint of back and neck pain.  Patient was seen for shoulder pain.  Found to have a labral tear.  [name] done April 30.  OMT on 10/17/2023. Patient states has done exercises but it causes more pain. [name], outside providers and external EMR if available.   As well as notes that were available from care everywhere and other healthcare systems.  Past medical history, social, surgical and family history all reviewed in electronic medical record.  No pertanent information unless stated regarding to the chief complaint.   Past Medical History:  Diagnosis Date   Anemia    Anxiety    Blood clot in vein    Right lower leg after surgery   Crohn's disease (HCC)    Depression    Dyspnea    Elevated BP without diagnosis of hypertension    Enlarged liver    GERD (gastroesophageal reflux disease)    Headache    IBS (irritable bowel syndrome)    Laceration of left little finger 02/28/2018   Lower extremity edema    Restless legs    Seasonal allergies    Uterine fibroid    Vitamin B 12 deficiency    Vitamin D  deficiency     Allergies  Allergen Reactions   Wellbutrin [Bupropion] Anxiety   Adhesive [Tape]      Review of Systems:  No headache, visual changes, nausea, vomiting, diarrhea, constipation, dizziness, abdominal pain, skin rash, fevers, chills, night sweats, weight loss, swollen lymph nodes, body aches, joint swelling, chest pain, shortness of breath, mood changes. POSITIVE muscle  aches  Objective  Blood pressure 104/74, pulse 100, height 5' 7 (1.702 m), weight 265 lb (120.2 kg), [name], [name], [name], non tender, no erythema  Gait relatively normal MSK:  Back  Shoulder exam shows patient does have a positive O'Brien's noted.  Pain with external rotation.  Does have limited range of motion noted as well.   Osteopathic findings  C3 flexed rotated and side bent right C6 flexed rotated and side bent left T5 extended rotated and side bent right inhaled rib T7 extended rotated and side bent left L2 flexed rotated and side bent right Sacrum right on right   Limited muscular skeletal ultrasound was performed and interpreted by [name] Traci [name], [name], right, [name], [name], [name], [name], ME, [name], rib, thoracic, lumbar, [name], stretches and lifestyle modifications  See medications in patient instructions if given  Patient will follow up in 4-8 weeks    The above documentation has been reviewed and is accurate and complete Traci Mabey M Nicholson Starace, DO          Note: This dictation was prepared with Dragon dictation along with smaller phrase technology. Any transcriptional errors that result from this process are unintentional.

## 2023-11-28 ENCOUNTER — Other Ambulatory Visit: Payer: Self-pay

## 2023-11-28 ENCOUNTER — Ambulatory Visit: Admitting: Family Medicine

## 2023-11-28 ENCOUNTER — Encounter: Payer: Self-pay | Admitting: Family Medicine

## 2023-11-28 VITALS — BP 104/74 | HR 100 | Ht 67.0 in | Wt 265.0 lb

## 2023-11-28 DIAGNOSIS — M9908 Segmental and somatic dysfunction of rib cage: Secondary | ICD-10-CM

## 2023-11-28 DIAGNOSIS — M545 Low back pain, unspecified: Secondary | ICD-10-CM | POA: Diagnosis not present

## 2023-11-28 DIAGNOSIS — M25511 Pain in right shoulder: Secondary | ICD-10-CM

## 2023-11-28 DIAGNOSIS — M9902 Segmental and somatic dysfunction of thoracic region: Secondary | ICD-10-CM

## 2023-11-28 DIAGNOSIS — S43431A Superior glenoid labrum lesion of right shoulder, initial encounter: Secondary | ICD-10-CM

## 2023-11-28 DIAGNOSIS — M9901 Segmental and somatic dysfunction of cervical region: Secondary | ICD-10-CM

## 2023-11-28 DIAGNOSIS — M9903 Segmental and somatic dysfunction of lumbar region: Secondary | ICD-10-CM | POA: Diagnosis not present

## 2023-11-28 DIAGNOSIS — M9904 Segmental and somatic dysfunction of sacral region: Secondary | ICD-10-CM

## 2023-11-28 NOTE — Assessment & Plan Note (Signed)
 Patient has not responded well to the [name].  Patient did have an geode previously and does have a posterior labral tear noted on MRI.  At this point has failed steroid injections, [name], home exercises and formal physical therapy.  Will send patient to orthopedic surgeon to discuss.

## 2023-11-28 NOTE — Assessment & Plan Note (Signed)
 Tightness noted in the low back.  Discussed icing regimen of home exercises, discussed which activities to do in which ones to avoid.  Patient is continuing to try to work on her weight and core strengthening.  Patient is down 9 pounds in the last 3 months which I do think is wonderful.  Follow-up with me again in 6 to 8 weeks

## 2023-11-28 NOTE — Patient Instructions (Addendum)
 Ref Dr. Dozier Richard to see you! See you again in 2 months

## 2023-12-09 ENCOUNTER — Telehealth: Payer: Self-pay | Admitting: Family Medicine

## 2023-12-09 NOTE — Telephone Encounter (Signed)
 Patient called and stated that [facility] still has not received our referral. They asked that we send to both fax numbers: [phone] with attn: Brittney. Please advise.

## 2023-12-10 NOTE — Telephone Encounter (Signed)
Responded to patient through mychart.

## 2024-01-08 ENCOUNTER — Ambulatory Visit (INDEPENDENT_AMBULATORY_CARE_PROVIDER_SITE_OTHER): Admitting: Physician Assistant

## 2024-01-08 ENCOUNTER — Ambulatory Visit: Admitting: Physician Assistant

## 2024-01-08 ENCOUNTER — Encounter: Payer: Self-pay | Admitting: Physician Assistant

## 2024-01-08 DIAGNOSIS — F4323 Adjustment disorder with mixed anxiety and depressed mood: Secondary | ICD-10-CM | POA: Diagnosis not present

## 2024-01-08 DIAGNOSIS — F411 Generalized anxiety disorder: Secondary | ICD-10-CM

## 2024-01-08 DIAGNOSIS — Z566 Other physical and mental strain related to work: Secondary | ICD-10-CM

## 2024-01-08 DIAGNOSIS — G2581 Restless legs syndrome: Secondary | ICD-10-CM

## 2024-01-08 MED ORDER — GABAPENTIN 100 MG PO CAPS: 100.0000 mg | ORAL_CAPSULE | Freq: Two times a day (BID) | ORAL | 1 refills | Status: DC

## 2024-01-08 NOTE — Progress Notes (Signed)
 [facility]  Patient ID: Traci [name],  MRN: [id number]  PCP: Kip Righter, MD  Date of Evaluation: 01/08/2024 time spent:20 minutes  Chief Complaint:  Chief Complaint   Anxiety; Depression; Follow-up    HISTORY/CURRENT STATUS: HPI For routine med check.    Very stressful at work. There's only her and 1 other lady that works in her dept at [employer].  She is not able to get off work easily.  She stays tired all the time.  Cries easily and feels that she has a short fuse.  She does not do much for fun, she is too exhausted after she gets home.  And on weekends she wants to stay at home and rest. Sleeps ok.  ADLs and personal hygiene are normal.   Denies any changes in concentration, making decisions, or remembering things.  Appetite has not changed.  Weight is stable.  Anxiety is still present.  She usually has to take the Klonopin  daily.  It does not seem to be working as well as it once did.  She does not have full-blown panic attacks but feels extremely overwhelmed.  No mania, delirium, AH/VH.  No SI/HI.  Individual Medical History/ Review of Systems: Changes? :No       Past medications for mental health diagnoses include: Zoloft , Xanax, Wellbutrin, Lexapro, Effexor, Luvox, Cymbalta  caused more anxiety than depression, Ropinerole, Gabapentin     Allergies: Wellbutrin [bupropion] and Adhesive [tape]  Current Medications:  Current Outpatient Medications:    acetaminophen  (TYLENOL ) 500 MG tablet, Take 1,000 mg by mouth 2 (two) times daily as needed. Rapid release, Disp: , Rfl:    azaTHIOprine (IMURAN) 50 MG tablet, Take 125 mg by mouth daily., Disp: , Rfl:    Cholecalciferol 25 MCG (1000 UT) tablet, Take by mouth., Disp: , Rfl:    clonazePAM  (KLONOPIN ) 0.5 MG tablet, TAKE 1/2 TO 1 TABLET BY MOUTH THREE TIMES DAILY AS NEEDED, Disp: 270 tablet, Rfl: 1   cyanocobalamin (,VITAMIN B-12,) 1000 MCG/ML injection, Inject 1,000 mcg into the muscle every 30 (thirty) days.,  Disp: , Rfl:    doxycycline (VIBRAMYCIN) 50 MG capsule, Take 50 mg by mouth., Disp: , Rfl:    ferrous sulfate 325 (65 FE) MG tablet, Take by mouth., Disp: , Rfl:    fluticasone (FLONASE) 50 MCG/ACT nasal spray, Place into the nose., Disp: , Rfl:    folic acid (FOLVITE) 1 MG tablet, Take 1 mg by mouth daily., Disp: , Rfl:    gabapentin  (NEURONTIN ) 100 MG capsule, Take 1-2 capsules (100-200 mg total) by mouth 2 (two) times daily with breakfast and lunch. prn, Disp: 120 capsule, Rfl: 1   gabapentin  (NEURONTIN ) 600 MG tablet, Take 1 tablet (600 mg total) by mouth at bedtime., Disp: 90 tablet, Rfl: 1   inFLIXimab (REMICADE IV), Inject into the vein., Disp: , Rfl:    lamoTRIgine  (LAMICTAL ) 200 MG tablet, Take 1 tablet (200 mg total) by mouth 2 (two) times daily., Disp: 180 tablet, Rfl: 3   loratadine (CLARITIN) 10 MG tablet, Take 10 mg by mouth daily., Disp: , Rfl:    Melatonin 10 MG TABS, Take by mouth., Disp: , Rfl:    montelukast (SINGULAIR) 10 MG tablet, TAKE 1 TABLET(10 MG) BY MOUTH DAILY, Disp: , Rfl:    omeprazole (PRILOSEC) 40 MG capsule, Take 40 mg by mouth daily., Disp: , Rfl:    sertraline  (ZOLOFT ) 100 MG tablet, Take 2.5 tablets (250 mg total) by mouth daily., Disp: 225 tablet, Rfl: 3  tiZANidine  (ZANAFLEX ) 4 MG tablet, TAKE 1 TABLET(4 MG) BY MOUTH AT BEDTIME, Disp: 30 tablet, Rfl: 0   Vitamin D , Ergocalciferol , (DRISDOL ) 1.25 MG (50000 UNIT) CAPS capsule, Take 1 capsule (50,000 Units total) by mouth every 7 (seven) days., Disp: 12 capsule, Rfl: 0   inFLIXimab-axxq (AVSOLA IV), Inject into the vein. Q 7 weeks, Disp: , Rfl:  Medication Side Effects: none  Family Medical/ Social History: Changes?    MENTAL HEALTH EXAM:  There were no vitals taken for this visit.There is no height or weight on file to calculate BMI.  General Appearance: Casual, Well Groomed, and Obese  Eye Contact:  Good  Speech:  Clear and Coherent and Normal Rate  Volume:  Normal  Mood:  Anxious  Affect:  Congruent   Thought Process:  Goal Directed and Descriptions of Associations: Circumstantial  Orientation:  Full (Time, Place, and Person)  Thought Content: Logical   Suicidal Thoughts:  No  Homicidal Thoughts:  No  Memory:  WNL  Judgement:  Good  Insight:  Good  Psychomotor Activity:  Normal  Concentration:  Concentration: Good and Attention Span: Good  Recall:  Good  Fund of Knowledge: Good  Language: Good  Assets:  Communication Skills Desire for Improvement Financial Resources/Insurance Housing Leisure Time Resilience Social Support Transportation Vocational/Educational  ADL's:  Intact  Cognition: WNL  Prognosis:  Good   DIAGNOSES:    ICD-10-CM   1. Generalized anxiety disorder  F41.1     2. Situational mixed anxiety and depressive disorder  F43.23     3. Restless leg syndrome  G25.81     4. Work stress  Z56.6       Receiving Psychotherapy: No   RECOMMENDATIONS:  PDMP reviewed.  No controlled substances listed. I provided approximately 20 minutes of face to face time during this encounter, including time spent before and after the visit in records review, medical decision making, counseling pertinent to today's visit, and charting.   Discussed the worsening anxiety.  Recommend adding low dose Gabapentin  in the morning as needed.  Hopefully that will help to prevent the anxiety and when she does have break-thru, the Klonopin  will be more effective.   Continue Klonopin  0.5 mg, 1/2-1 tid prn. Start gabapentin  100 mg, 1-2 p.o. at breakfast and lunch as needed.  She will continue gabapentin  600 mg, 1 at bedtime.  Continue Lamictal  200 mg, 1 p.o. twice daily. Continue  Zoloft  100 mg to 2.5 pills daily. Continue MVI, Vit D, B Complex. Return in 6-8 weeks.   Verneita Cooks, PA-C

## 2024-01-14 NOTE — Progress Notes (Unsigned)
 [name] [address] [place] [address] Phone: [phone] Subjective:   LILLETTE Berwyn Posey, am serving as a scribe for Dr. Arthea Claudene.  I'm seeing this patient by the request  of:  Kip Righter, MD  CC: Back and neck pain follow-up as well as shoulder pain follow-up  YEP:Dlagzrupcz  Traci [name] is a 43 y.o. female coming in with complaint of back and neck pain. OMT on 11/28/2023.  Patient was also having posterior shoulder pain.  Patient states that her back has been doing ok. Had injection in biceps tendon from [facility]. Did PT for 1 month. Saw Dr. Dozier yesterday and got another injection but in posterior aspect.   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    Reviewed prior external information including notes and imaging from previsou exam, outside providers and external EMR if available.   As well as notes that were available from care everywhere and other healthcare systems.  Past medical history, social, surgical and family history all reviewed in electronic medical record.  No pertanent information unless stated regarding to the chief complaint.   Past Medical History:  Diagnosis Date   Anemia    Anxiety    Blood clot in vein    Right lower leg after surgery   Crohn's disease (HCC)    Depression    Dyspnea    Elevated BP without diagnosis of hypertension    Enlarged liver    GERD (gastroesophageal reflux disease)    Headache    IBS (irritable bowel syndrome)    Laceration of left little finger 02/28/2018   Lower extremity edema    Restless legs    Seasonal allergies    Uterine fibroid    Vitamin B 12 deficiency    Vitamin D  deficiency     Allergies  Allergen Reactions   Wellbutrin [Bupropion] Anxiety   Adhesive [Tape]      Review of Systems:  No  headache, visual changes, nausea, vomiting, diarrhea, constipation, dizziness, abdominal pain, skin rash, fevers, chills, night sweats, weight loss, swollen lymph nodes, body aches, joint swelling, chest pain, shortness of breath, mood changes. POSITIVE muscle aches  Objective  Blood pressure 122/82, pulse 85, height 5' 7 (1.702 m), SpO2 95%.   General: No apparent distress alert and oriented x3 mood and affect normal, [name], [name], non tender, no erythema  Patient does have some scapular dyskinesis still noted on the right shoulder.  Positive O'Brien's of the right shoulder noted as well.  Some limitation range of motion on the right side.  Osteopathic findings  C2 flexed rotated and side bent right C6 flexed rotated and side bent right T3 extended rotated and side bent right inhaled rib T9 extended rotated and side bent right L2 flexed rotated and side bent right Sacrum right on right     Assessment and Plan:  Scapular dyskinesis Continue to work on posture and ergonomics.  Discussed which activities to do and which ones to avoid.  Will continue to monitor and work on the ergonomics throughout the day.  No new medication.  Follow-up again in 6 weeks  Glenoid labral tear, right, initial encounter Seen of the providers,  will consider the possibility of another steroid injection but got 1 from somewhere else.  Would not have any surgery until next year.  Is considering it though if necessary.    Nonallopathic problems  Decision today to treat with OMT was based on Physical Exam  After verbal consent patient was treated with HVLA, ME, [name], rib, thoracic, lumbar, [name],  stretches and lifestyle modifications  See medications in patient instructions if given  Patient will follow up in 4-8 weeks    The above documentation has been reviewed and is accurate and complete Azura Tufaro M Jaleea Alesi, DO          Note: This dictation was prepared with Dragon dictation along with smaller phrase technology. Any transcriptional errors that result from this process are unintentional.

## 2024-01-16 ENCOUNTER — Ambulatory Visit: Admitting: Family Medicine

## 2024-01-16 ENCOUNTER — Encounter: Payer: Self-pay | Admitting: Family Medicine

## 2024-01-16 VITALS — BP 122/82 | HR 85 | Ht 67.0 in

## 2024-01-16 DIAGNOSIS — S43431A Superior glenoid labrum lesion of right shoulder, initial encounter: Secondary | ICD-10-CM

## 2024-01-16 DIAGNOSIS — M9901 Segmental and somatic dysfunction of cervical region: Secondary | ICD-10-CM | POA: Diagnosis not present

## 2024-01-16 DIAGNOSIS — M9903 Segmental and somatic dysfunction of lumbar region: Secondary | ICD-10-CM | POA: Diagnosis not present

## 2024-01-16 DIAGNOSIS — M9908 Segmental and somatic dysfunction of rib cage: Secondary | ICD-10-CM

## 2024-01-16 DIAGNOSIS — M9904 Segmental and somatic dysfunction of sacral region: Secondary | ICD-10-CM | POA: Diagnosis not present

## 2024-01-16 DIAGNOSIS — M9902 Segmental and somatic dysfunction of thoracic region: Secondary | ICD-10-CM

## 2024-01-16 DIAGNOSIS — G2589 Other specified extrapyramidal and movement disorders: Secondary | ICD-10-CM

## 2024-01-16 NOTE — Assessment & Plan Note (Signed)
 Continue to work on [employer].  Discussed which activities to do and which ones to avoid.  Will continue to monitor and work on the ergonomics throughout the day.  No new medication.  Follow-up again in 6 weeks

## 2024-01-16 NOTE — Assessment & Plan Note (Signed)
 Seen of the providers, will consider the possibility of another steroid injection but got 1 from somewhere else.  Would not have any surgery until next year.  Is considering it though if necessary.

## 2024-01-16 NOTE — Patient Instructions (Signed)
 Good to see you Enjoy the festival See me in 2-3 months

## 2024-02-25 ENCOUNTER — Other Ambulatory Visit: Payer: Self-pay | Admitting: Family Medicine

## 2024-02-26 ENCOUNTER — Encounter: Payer: Self-pay | Admitting: Family Medicine

## 2024-02-26 ENCOUNTER — Encounter: Payer: Self-pay | Admitting: Physician Assistant

## 2024-02-26 ENCOUNTER — Ambulatory Visit: Admitting: Physician Assistant

## 2024-02-26 DIAGNOSIS — G2581 Restless legs syndrome: Secondary | ICD-10-CM

## 2024-02-26 DIAGNOSIS — F411 Generalized anxiety disorder: Secondary | ICD-10-CM

## 2024-02-26 DIAGNOSIS — F3341 Major depressive disorder, recurrent, in partial remission: Secondary | ICD-10-CM

## 2024-02-26 NOTE — Progress Notes (Signed)
 [facility]  Patient ID: Traci [name],  MRN: [id number]  PCP: Kip Righter, MD  Date of Evaluation: 02/26/2024 time spent:20 minutes  Chief Complaint:  Chief Complaint   Anxiety; Follow-up    HISTORY/CURRENT STATUS: HPI For routine med check.    We added Gabapentin  100 mg at breakfast and lunch. Anxiety is much better controlled. The RLS is well treated with the higher dose of Gabapentin .  She is able to enjoy things.  Energy and motivation are good.  Work is going well.  Stressful as usual.  No extreme sadness, tearfulness, or feelings of hopelessness.  Sleeps well most of the time. ADLs and personal hygiene are normal.   Denies any changes in concentration, making decisions, or remembering things.  Appetite has not changed.  Weight is stable.   No mania, delirium, AH/VH.  No SI/HI.  Individual Medical History/ Review of Systems: Changes? :No       Past medications for mental health diagnoses include: Zoloft , Xanax, Wellbutrin, Lexapro, Effexor, Luvox, Cymbalta  caused more anxiety than depression, Ropinerole, Gabapentin     Allergies: Wellbutrin [bupropion] and Adhesive [tape]  Current Medications:  Current Outpatient Medications:    acetaminophen  (TYLENOL ) 500 MG tablet, Take 1,000 mg by mouth 2 (two) times daily as needed. Rapid release, Disp: , Rfl:    azaTHIOprine (IMURAN) 50 MG tablet, Take 125 mg by mouth daily., Disp: , Rfl:    Cholecalciferol 25 MCG (1000 UT) tablet, Take by mouth., Disp: , Rfl:    clonazePAM  (KLONOPIN ) 0.5 MG tablet, TAKE 1/2 TO 1 TABLET BY MOUTH THREE TIMES DAILY AS NEEDED, Disp: 270 tablet, Rfl: 1   cyanocobalamin (,VITAMIN B-12,) 1000 MCG/ML injection, Inject 1,000 mcg into the muscle every 30 (thirty) days., Disp: , Rfl:    fluticasone (FLONASE) 50 MCG/ACT nasal spray, Place into the nose., Disp: , Rfl:    folic acid (FOLVITE) 1 MG tablet, Take 1 mg by mouth daily., Disp: , Rfl:    gabapentin  (NEURONTIN ) 100 MG capsule, Take 1-2  capsules (100-200 mg total) by mouth 2 (two) times daily with breakfast and lunch. prn, Disp: 120 capsule, Rfl: 1   gabapentin  (NEURONTIN ) 600 MG tablet, Take 1 tablet (600 mg total) by mouth at bedtime., Disp: 90 tablet, Rfl: 1   inFLIXimab (REMICADE IV), Inject into the vein., Disp: , Rfl:    inFLIXimab-axxq (AVSOLA IV), Inject into the vein. Q 7 weeks, Disp: , Rfl:    lamoTRIgine  (LAMICTAL ) 200 MG tablet, Take 1 tablet (200 mg total) by mouth 2 (two) times daily., Disp: 180 tablet, Rfl: 3   loratadine (CLARITIN) 10 MG tablet, Take 10 mg by mouth daily., Disp: , Rfl:    Melatonin 10 MG TABS, Take by mouth., Disp: , Rfl:    montelukast (SINGULAIR) 10 MG tablet, TAKE 1 TABLET(10 MG) BY MOUTH DAILY, Disp: , Rfl:    omeprazole (PRILOSEC) 40 MG capsule, Take 40 mg by mouth daily., Disp: , Rfl:    sertraline  (ZOLOFT ) 100 MG tablet, Take 2.5 tablets (250 mg total) by mouth daily., Disp: 225 tablet, Rfl: 3   tiZANidine  (ZANAFLEX ) 4 MG tablet, TAKE 1 TABLET(4 MG) BY MOUTH AT BEDTIME (Patient taking differently: prn), Disp: 30 tablet, Rfl: 0   doxycycline (VIBRAMYCIN) 50 MG capsule, Take 50 mg by mouth., Disp: , Rfl:    ferrous sulfate 325 (65 FE) MG tablet, Take by mouth., Disp: , Rfl:    Vitamin D , Ergocalciferol , (DRISDOL ) 1.25 MG (50000 UNIT) CAPS capsule, Take 1 capsule (50,000 Units total)  by mouth every 7 (seven) days. (Patient not taking: Reported on 02/26/2024), Disp: 12 capsule, Rfl: 0 Medication Side Effects: none  Family Medical/ Social History: Changes?  no  MENTAL HEALTH EXAM:  There were no vitals taken for this visit.There is no height or weight on file to calculate BMI.  General Appearance: Casual and Well Groomed  Eye Contact:  Good  Speech:  Clear and Coherent and Normal Rate  Volume:  Normal  Mood:  Euthymic  Affect:  Congruent  Thought Process:  Goal Directed and Descriptions of Associations: Circumstantial  Orientation:  Full (Time, Place, and Person)  Thought Content:  Logical   Suicidal Thoughts:  No  Homicidal Thoughts:  No  Memory:  WNL  Judgement:  Good  Insight:  Good  Psychomotor Activity:  Normal  Concentration:  Concentration: Good and Attention Span: Good  Recall:  Good  Fund of Knowledge: Good  Language: Good  Assets:  Communication Skills Desire for Improvement Financial Resources/Insurance Housing Leisure Time Resilience Social Support Transportation Vocational/Educational  ADL's:  Intact  Cognition: WNL  Prognosis:  Good   DIAGNOSES:    ICD-10-CM   1. Generalized anxiety disorder  F41.1     2. Restless leg syndrome  G25.81     3. 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  No controlled substances listed. I provided approximately  20 minutes of face to face time during this encounter, including time spent before and after the visit in records review, medical decision making, counseling pertinent to today's visit, and charting.   Landry is doing well on the current treatment so no changes will be made.  Continue Klonopin  0.5 mg, 1/2-1 tid prn. Continue gabapentin  100 mg, 1 p.o. at breakfast and lunch as needed.  She will continue gabapentin  600 mg, 1 at bedtime.  Continue Lamictal  200 mg, 1 p.o. twice daily. Continue  Zoloft  100 mg to 2.5 pills daily. Continue MVI, Vit D, B Complex. Return in 3 months.  Verneita Cooks, PA-C

## 2024-03-13 ENCOUNTER — Other Ambulatory Visit: Payer: Self-pay | Admitting: Physician Assistant

## 2024-03-16 ENCOUNTER — Other Ambulatory Visit: Payer: Self-pay | Admitting: Physician Assistant

## 2024-03-16 DIAGNOSIS — F411 Generalized anxiety disorder: Secondary | ICD-10-CM

## 2024-04-08 ENCOUNTER — Ambulatory Visit: Admitting: Family Medicine

## 2024-04-13 ENCOUNTER — Other Ambulatory Visit: Payer: Self-pay | Admitting: Physician Assistant

## 2024-04-22 NOTE — Progress Notes (Unsigned)
 [name] [address] [place] [address] Phone: [phone] Subjective:   Traci [name], am serving as a scribe for Dr. Arthea Claudene.  I'm seeing this patient by the request  of:  Kip Righter, MD  CC: Right shoulder pain and neck pain.  YEP:Dlagzrupcz  Traci [name] is a 43 y.o. female coming in with complaint of back and neck pain. OMT on 01/16/2024. Patient states that her R shoulder has been bothering her.   More pain in scapula and neck recently.   Medications patient has been prescribed: zanaflex   Taking:         Reviewed prior external information including notes and imaging from previsou exam, outside providers and external EMR if available.   As well as notes that were available from care everywhere and other healthcare systems.  Past medical history, social, surgical and family history all reviewed in electronic medical record.  No pertanent information unless stated regarding to the chief complaint.   Past Medical History:  Diagnosis Date   Anemia    Anxiety    Blood clot in vein    Right lower leg after surgery   Crohn's disease (HCC)    Depression    Dyspnea    Elevated BP without diagnosis of hypertension    Enlarged liver    GERD (gastroesophageal reflux disease)    Headache    IBS (irritable bowel syndrome)    Laceration of left little finger 02/28/2018   Lower extremity edema    Restless legs    Seasonal allergies    Uterine fibroid    Vitamin B 12 deficiency    Vitamin D  deficiency     Allergies[1]   Review of Systems:  No headache, visual changes, nausea, vomiting, diarrhea, constipation, dizziness, abdominal pain, skin rash, fevers, chills, night sweats, weight loss, swollen lymph nodes, body aches, joint swelling, chest pain, shortness of breath, mood changes. POSITIVE muscle aches  Objective  Blood pressure 128/86, pulse (!) 105, height 5' 7 (1.702 m), weight 267 lb (121.1 kg), SpO2  97%.   General: No apparent distress alert and oriented x3 mood and affect normal, [name], [name], non tender, no erythema  Gait MSK:  Back does have some loss of lordosis noted.  Some tenderness to palpation in the paraspinal musculature.  Tightness with FABER right greater than left.  Osteopathic findings   C7 flexed rotated and side bent right T3 extended rotated and side bent right inhaled rib T9 extended rotated and side bent left L2 flexed rotated and side bent right L3 flexed rotated and side bent left Sacrum right on right  Procedure: Real-time Ultrasound Guided Injection of right glenohumeral joint Device: GE Logiq Q7  Ultrasound guided injection is preferred based studies that show increased duration, increased effect, greater accuracy, decreased procedural pain, increased response rate with ultrasound guided versus blind injection.  Verbal informed consent obtained.  Time-out conducted.  Noted no overlying erythema, induration, or other signs of local infection.  Skin prepped in a sterile fashion.  Local anesthesia: Topical Ethyl chloride.  With sterile technique and under real time ultrasound guidance:  Joint visualized.  23g 1  inch needle inserted posterior approach. Pictures taken for needle placement. Patient did have injection of 2 cc of 1% lidocaine, 2 cc of 0.5% Marcaine, and 1.0 cc of  Kenalog 40 mg/dL. Completed without difficulty  Pain immediately resolved suggesting accurate placement of the medication.  Advised to call if fevers/chills, erythema, induration, drainage, or persistent bleeding.  Impression: Technically successful ultrasound guided injection.     Assessment and Plan:  Glenoid labral tear, right, initial encounter Hopeful that patient will make a difference.   Has had 2 steroid injections but it has been over the course of 1 year.  Hopeful that this will make more of an improvement.  Patient is still holding off on any type of surgical intervention.  Discussed icing regimen.  Follow-up with me again in 8 weeks otherwise.  Scapular dyskinesis Continues to have some scapular dyskinesis noted.  Discussed icing regimen and home exercises, discussed which activities to do and which ones to avoid.  Continue to work on posture and    Nonallopathic problems  Decision today to treat with OMT was based on Physical Exam  After verbal consent patient was treated with HVLA, ME, [name], rib, thoracic, lumbar, [name], stretches and lifestyle modifications  See medications in patient instructions if given  Patient will follow up in 4-8 weeks     The above documentation has been reviewed and is accurate and complete Rea Reser M Zachery Niswander, DO         Note: This dictation was prepared with Dragon dictation along with smaller phrase technology. Any transcriptional errors that result from this process are unintentional.            [1]  Allergies Allergen Reactions   Wellbutrin [Bupropion] Anxiety   Adhesive [Tape]

## 2024-04-23 ENCOUNTER — Ambulatory Visit: Admitting: Family Medicine

## 2024-04-23 ENCOUNTER — Other Ambulatory Visit: Payer: Self-pay

## 2024-04-23 ENCOUNTER — Encounter: Payer: Self-pay | Admitting: Family Medicine

## 2024-04-23 VITALS — BP 128/86 | HR 105 | Ht 67.0 in | Wt 267.0 lb

## 2024-04-23 DIAGNOSIS — M9902 Segmental and somatic dysfunction of thoracic region: Secondary | ICD-10-CM

## 2024-04-23 DIAGNOSIS — G2589 Other specified extrapyramidal and movement disorders: Secondary | ICD-10-CM | POA: Diagnosis not present

## 2024-04-23 DIAGNOSIS — M9908 Segmental and somatic dysfunction of rib cage: Secondary | ICD-10-CM | POA: Diagnosis not present

## 2024-04-23 DIAGNOSIS — M9904 Segmental and somatic dysfunction of sacral region: Secondary | ICD-10-CM

## 2024-04-23 DIAGNOSIS — M9901 Segmental and somatic dysfunction of cervical region: Secondary | ICD-10-CM

## 2024-04-23 DIAGNOSIS — M9903 Segmental and somatic dysfunction of lumbar region: Secondary | ICD-10-CM | POA: Diagnosis not present

## 2024-04-23 DIAGNOSIS — S43431A Superior glenoid labrum lesion of right shoulder, initial encounter: Secondary | ICD-10-CM

## 2024-04-23 DIAGNOSIS — M25511 Pain in right shoulder: Secondary | ICD-10-CM | POA: Diagnosis not present

## 2024-04-23 NOTE — Patient Instructions (Addendum)
Injected shoulder today See me again in 2-3 months  

## 2024-04-23 NOTE — Assessment & Plan Note (Addendum)
 Continues to have some scapular dyskinesis noted.  Discussed icing regimen and home exercises, discussed which activities to do and which ones to avoid.  Continue to work on posture and continue to be active.  We discussed with patient some weight loss could be beneficial as well which patient is working hard.  Follow-up again in 6 to 12 weeks

## 2024-04-23 NOTE — Assessment & Plan Note (Signed)
 Hopeful that patient will make a difference.  Has had 2 steroid injections but it has been over the course of 1 year.  Hopeful that this will make more of an improvement.  Patient is still holding off on any type of surgical intervention.  Discussed icing regimen.  Follow-up with me again in 8 weeks otherwise.

## 2024-05-15 ENCOUNTER — Encounter: Payer: Self-pay | Admitting: Physician Assistant

## 2024-05-15 ENCOUNTER — Ambulatory Visit (INDEPENDENT_AMBULATORY_CARE_PROVIDER_SITE_OTHER): Admitting: Physician Assistant

## 2024-05-15 DIAGNOSIS — F3341 Major depressive disorder, recurrent, in partial remission: Secondary | ICD-10-CM

## 2024-05-15 DIAGNOSIS — G2581 Restless legs syndrome: Secondary | ICD-10-CM | POA: Diagnosis not present

## 2024-05-15 DIAGNOSIS — F411 Generalized anxiety disorder: Secondary | ICD-10-CM | POA: Diagnosis not present

## 2024-05-15 NOTE — Progress Notes (Signed)
 "     [facility]  Patient ID: Traci [name],  MRN: [id number]  PCP: Kip Righter, MD  Date of Evaluation: 05/15/2024 Time spent:20 minutes  Chief Complaint:  Chief Complaint   Anxiety; Depression; Follow-up    HISTORY/CURRENT STATUS: HPI For routine 3 month med check.    Doing okay as far as her meds go. Work is still very stressful.  Short-handed, works in [employer] at [employer].  But 'it's ok.  I'm dealing with it.'  Energy and motivation are mostly good.   No extreme sadness, tearfulness, or feelings of hopelessness.  Sleeps ok.  ADLs and personal hygiene are normal.   Denies any changes in concentration, making decisions, or remembering things.  Appetite has not changed.  Weight is stable.  Anxiety is well-controlled w/ current meds.   No mania, delirium, AH/VH.  No SI/HI.  Individual Medical History/ Review of Systems: Changes? :No     continues tx for Crohns  Past medications for mental health diagnoses include: Zoloft , Xanax, Wellbutrin, Lexapro, Effexor, Luvox, Cymbalta  caused more anxiety than depression, Ropinerole, Gabapentin     Allergies: Wellbutrin [bupropion] and Adhesive [tape]  Current Medications:  Current Outpatient Medications:    acetaminophen  (TYLENOL ) 500 MG tablet, Take 1,000 mg by mouth 2 (two) times daily as needed. Rapid release, Disp: , Rfl:    azaTHIOprine (IMURAN) 50 MG tablet, Take 125 mg by mouth daily., Disp: , Rfl:    Cholecalciferol 25 MCG (1000 UT) tablet, Take by mouth., Disp: , Rfl:    clonazePAM  (KLONOPIN ) 0.5 MG tablet, TAKE 1/2 TO 1 TABLET BY MOUTH THREE TIMES DAILY AS NEEDED, Disp: 270 tablet, Rfl: 0   cyanocobalamin (,VITAMIN B-12,) 1000 MCG/ML injection, Inject 1,000 mcg into the muscle every 30 (thirty) days., Disp: , Rfl:    ferrous sulfate 325 (65 FE) MG tablet, Take by mouth., Disp: , Rfl:    fluticasone (FLONASE) 50 MCG/ACT nasal spray, Place into the nose., Disp: , Rfl:    folic acid (FOLVITE) 1 MG tablet, Take 1 mg by  mouth daily., Disp: , Rfl:    gabapentin  (NEURONTIN ) 100 MG capsule, TAKE 1 TO 2 CAPSULES(100 TO 200 MG) BY MOUTH TWICE DAILY WITH BREAKFAST AND LUNCH AS NEEDED, Disp: 120 capsule, Rfl: 0   gabapentin  (NEURONTIN ) 600 MG tablet, TAKE 1 TABLET(600 MG) BY MOUTH AT BEDTIME, Disp: 90 tablet, Rfl: 0   inFLIXimab (REMICADE IV), Inject into the vein., Disp: , Rfl:    inFLIXimab-axxq (AVSOLA IV), Inject into the vein. Q 7 weeks, Disp: , Rfl:    lamoTRIgine  (LAMICTAL ) 200 MG tablet, Take 1 tablet (200 mg total) by mouth 2 (two) times daily., Disp: 180 tablet, Rfl: 3   loratadine (CLARITIN) 10 MG tablet, Take 10 mg by mouth daily., Disp: , Rfl:    Melatonin 10 MG TABS, Take by mouth., Disp: , Rfl:    montelukast (SINGULAIR) 10 MG tablet, TAKE 1 TABLET(10 MG) BY MOUTH DAILY, Disp: , Rfl:    omeprazole (PRILOSEC) 40 MG capsule, Take 40 mg by mouth daily., Disp: , Rfl:    sertraline  (ZOLOFT ) 100 MG tablet, Take 2.5 tablets (250 mg total) by mouth daily., Disp: 225 tablet, Rfl: 3   Vitamin D , Ergocalciferol , (DRISDOL ) 1.25 MG (50000 UNIT) CAPS capsule, Take 1 capsule (50,000 Units total) by mouth every 7 (seven) days., Disp: 12 capsule, Rfl: 0   doxycycline (VIBRAMYCIN) 50 MG capsule, Take 50 mg by mouth., Disp: , Rfl:    tiZANidine  (ZANAFLEX ) 4 MG tablet, TAKE  1 TABLET(4 MG) BY MOUTH AT BEDTIME (Patient taking differently: prn), Disp: 30 tablet, Rfl: 0 Medication Side Effects: none  Family Medical/ Social History: Changes?  no  MENTAL HEALTH EXAM:  There were no vitals taken for this visit.There is no height or weight on file to calculate BMI.  General Appearance: Casual and Well Groomed  Eye Contact:  Good  Speech:  Clear and Coherent and Normal Rate  Volume:  Normal  Mood:  Euthymic  Affect:  Congruent  Thought Process:  Goal Directed and Descriptions of Associations: Circumstantial  Orientation:  Full (Time, Place, and Person)  Thought Content: Logical   Suicidal Thoughts:  No  Homicidal  Thoughts:  No  Memory:  WNL  Judgement:  Good  Insight:  Good  Psychomotor Activity:  Normal  Concentration:  Concentration: Good and Attention Span: Good  Recall:  Good  Fund of Knowledge: Good  Language: Good  Assets:  Communication Skills Desire for Improvement Financial Resources/Insurance Housing Leisure Time Resilience Transportation Vocational/Educational  ADL's:  Intact  Cognition: WNL  Prognosis:  Good   DIAGNOSES:    ICD-10-CM   1. Generalized anxiety disorder  F41.1     2. Restless leg syndrome  G25.81     3. 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  No controlled substances listed. I provided approximately 20  minutes of face to face time during this encounter, including time spent before and after the visit in records review, medical decision making, counseling pertinent to today's visit, and charting.   Beth is stable on current tx so no changes will be made.   Continue Klonopin  0.5 mg, 1/2-1 tid prn. Continue gabapentin  100 mg, 1 p.o. at breakfast and lunch as needed.  She will continue gabapentin  600 mg, 1 at bedtime.  Continue Lamictal  200 mg, 1 p.o. twice daily. Continue  Zoloft  100 mg to 2.5 pills daily. Continue MVI, Vit D, B Complex. Return in 4 months.  Verneita Cooks, PA-C  "

## 2024-06-12 ENCOUNTER — Other Ambulatory Visit: Payer: Self-pay | Admitting: Physician Assistant

## 2024-06-12 DIAGNOSIS — F411 Generalized anxiety disorder: Secondary | ICD-10-CM

## 2024-07-15 ENCOUNTER — Ambulatory Visit: Admitting: Family Medicine

## 2024-09-15 ENCOUNTER — Ambulatory Visit: Admitting: Physician Assistant
